# Patient Record
Sex: Female | Born: 1940 | Race: Black or African American | Hispanic: No | Marital: Married | Smoking: Never smoker
Health system: Southern US, Community
[De-identification: ages and names within clinical notes are randomized; demographics above are authoritative.]

## PROBLEM LIST (undated history)

## (undated) DIAGNOSIS — F0280 Dementia in other diseases classified elsewhere without behavioral disturbance: Secondary | ICD-10-CM

## (undated) DIAGNOSIS — Z789 Other specified health status: Secondary | ICD-10-CM

## (undated) DIAGNOSIS — R569 Unspecified convulsions: Secondary | ICD-10-CM

## (undated) DIAGNOSIS — I10 Essential (primary) hypertension: Secondary | ICD-10-CM

## (undated) DIAGNOSIS — N189 Chronic kidney disease, unspecified: Secondary | ICD-10-CM

## (undated) DIAGNOSIS — Z950 Presence of cardiac pacemaker: Secondary | ICD-10-CM

## (undated) DIAGNOSIS — D649 Anemia, unspecified: Secondary | ICD-10-CM

## (undated) DIAGNOSIS — I82409 Acute embolism and thrombosis of unspecified deep veins of unspecified lower extremity: Secondary | ICD-10-CM

## (undated) DIAGNOSIS — R269 Unspecified abnormalities of gait and mobility: Secondary | ICD-10-CM

## (undated) DIAGNOSIS — J189 Pneumonia, unspecified organism: Secondary | ICD-10-CM

## (undated) DIAGNOSIS — I2510 Atherosclerotic heart disease of native coronary artery without angina pectoris: Secondary | ICD-10-CM

## (undated) DIAGNOSIS — E876 Hypokalemia: Secondary | ICD-10-CM

## (undated) DIAGNOSIS — G309 Alzheimer's disease, unspecified: Secondary | ICD-10-CM

## (undated) DIAGNOSIS — I739 Peripheral vascular disease, unspecified: Secondary | ICD-10-CM

## (undated) HISTORY — PX: PACEMAKER PLACEMENT: SHX43

## (undated) HISTORY — PX: INSERT / REPLACE / REMOVE PACEMAKER: SUR710

## (undated) HISTORY — DX: Presence of cardiac pacemaker: Z95.0

## (undated) HISTORY — DX: Hypokalemia: E87.6

## (undated) HISTORY — DX: Essential (primary) hypertension: I10

## (undated) HISTORY — DX: Anemia, unspecified: D64.9

## (undated) HISTORY — DX: Unspecified abnormalities of gait and mobility: R26.9

## (undated) HISTORY — PX: NO PAST SURGERIES: SHX2092

## (undated) HISTORY — DX: Acute embolism and thrombosis of unspecified deep veins of unspecified lower extremity: I82.409

## (undated) HISTORY — DX: Dementia in other diseases classified elsewhere, unspecified severity, without behavioral disturbance, psychotic disturbance, mood disturbance, and anxiety: F02.80

## (undated) HISTORY — DX: Alzheimer's disease, unspecified: G30.9

---

## 1997-07-25 ENCOUNTER — Ambulatory Visit (HOSPITAL_COMMUNITY): Admission: RE | Admit: 1997-07-25 | Discharge: 1997-07-25 | Payer: Self-pay | Admitting: Psychiatry

## 1998-05-21 ENCOUNTER — Emergency Department (HOSPITAL_COMMUNITY): Admission: EM | Admit: 1998-05-21 | Discharge: 1998-05-21 | Payer: Self-pay | Admitting: Emergency Medicine

## 1998-05-21 ENCOUNTER — Encounter: Payer: Self-pay | Admitting: Emergency Medicine

## 1998-06-23 ENCOUNTER — Encounter: Payer: Self-pay | Admitting: Internal Medicine

## 1998-06-23 ENCOUNTER — Ambulatory Visit (HOSPITAL_COMMUNITY): Admission: RE | Admit: 1998-06-23 | Discharge: 1998-06-23 | Payer: Self-pay | Admitting: Internal Medicine

## 1999-04-23 ENCOUNTER — Emergency Department (HOSPITAL_COMMUNITY): Admission: EM | Admit: 1999-04-23 | Discharge: 1999-04-23 | Payer: Self-pay | Admitting: Emergency Medicine

## 1999-04-23 ENCOUNTER — Encounter: Payer: Self-pay | Admitting: Emergency Medicine

## 2000-12-09 ENCOUNTER — Encounter: Payer: Self-pay | Admitting: Emergency Medicine

## 2000-12-10 ENCOUNTER — Inpatient Hospital Stay (HOSPITAL_COMMUNITY): Admission: EM | Admit: 2000-12-10 | Discharge: 2000-12-14 | Payer: Self-pay | Admitting: Emergency Medicine

## 2000-12-16 ENCOUNTER — Encounter: Payer: Self-pay | Admitting: Emergency Medicine

## 2000-12-16 ENCOUNTER — Emergency Department (HOSPITAL_COMMUNITY): Admission: EM | Admit: 2000-12-16 | Discharge: 2000-12-16 | Payer: Self-pay | Admitting: Emergency Medicine

## 2001-05-05 HISTORY — PX: OTHER SURGICAL HISTORY: SHX169

## 2001-08-17 ENCOUNTER — Encounter: Payer: Self-pay | Admitting: Internal Medicine

## 2001-08-17 ENCOUNTER — Encounter: Admission: RE | Admit: 2001-08-17 | Discharge: 2001-08-17 | Payer: Self-pay | Admitting: Internal Medicine

## 2002-01-13 ENCOUNTER — Inpatient Hospital Stay (HOSPITAL_COMMUNITY): Admission: EM | Admit: 2002-01-13 | Discharge: 2002-01-18 | Payer: Self-pay | Admitting: *Deleted

## 2002-01-14 ENCOUNTER — Encounter: Payer: Self-pay | Admitting: Cardiology

## 2002-01-15 ENCOUNTER — Encounter: Payer: Self-pay | Admitting: Cardiology

## 2002-01-16 ENCOUNTER — Encounter: Payer: Self-pay | Admitting: Internal Medicine

## 2002-05-31 ENCOUNTER — Inpatient Hospital Stay (HOSPITAL_COMMUNITY): Admission: EM | Admit: 2002-05-31 | Discharge: 2002-06-05 | Payer: Self-pay | Admitting: Emergency Medicine

## 2002-05-31 ENCOUNTER — Encounter: Payer: Self-pay | Admitting: Internal Medicine

## 2002-06-03 ENCOUNTER — Encounter: Payer: Self-pay | Admitting: Internal Medicine

## 2002-06-04 ENCOUNTER — Encounter: Payer: Self-pay | Admitting: Internal Medicine

## 2002-06-08 ENCOUNTER — Emergency Department (HOSPITAL_COMMUNITY): Admission: EM | Admit: 2002-06-08 | Discharge: 2002-06-08 | Payer: Self-pay | Admitting: Podiatry

## 2002-06-08 ENCOUNTER — Encounter: Payer: Self-pay | Admitting: *Deleted

## 2002-06-25 ENCOUNTER — Encounter: Payer: Self-pay | Admitting: Emergency Medicine

## 2002-06-26 ENCOUNTER — Inpatient Hospital Stay (HOSPITAL_COMMUNITY): Admission: EM | Admit: 2002-06-26 | Discharge: 2002-07-03 | Payer: Self-pay | Admitting: Emergency Medicine

## 2002-06-26 ENCOUNTER — Encounter: Payer: Self-pay | Admitting: Internal Medicine

## 2002-06-28 ENCOUNTER — Encounter: Payer: Self-pay | Admitting: Internal Medicine

## 2002-06-30 ENCOUNTER — Encounter: Payer: Self-pay | Admitting: Internal Medicine

## 2004-02-18 ENCOUNTER — Ambulatory Visit: Payer: Self-pay | Admitting: *Deleted

## 2004-03-17 ENCOUNTER — Ambulatory Visit: Payer: Self-pay | Admitting: Cardiology

## 2004-04-14 ENCOUNTER — Ambulatory Visit: Payer: Self-pay | Admitting: Internal Medicine

## 2004-05-05 ENCOUNTER — Ambulatory Visit: Payer: Self-pay | Admitting: Cardiology

## 2004-05-21 ENCOUNTER — Ambulatory Visit: Payer: Self-pay | Admitting: Internal Medicine

## 2004-06-11 ENCOUNTER — Ambulatory Visit: Payer: Self-pay | Admitting: Internal Medicine

## 2004-07-09 ENCOUNTER — Ambulatory Visit: Payer: Self-pay | Admitting: Internal Medicine

## 2004-08-06 ENCOUNTER — Ambulatory Visit: Payer: Self-pay | Admitting: Cardiovascular Disease

## 2004-08-13 ENCOUNTER — Ambulatory Visit: Payer: Self-pay | Admitting: Internal Medicine

## 2004-08-27 ENCOUNTER — Ambulatory Visit: Payer: Self-pay | Admitting: Internal Medicine

## 2004-09-26 ENCOUNTER — Emergency Department (HOSPITAL_COMMUNITY): Admission: EM | Admit: 2004-09-26 | Discharge: 2004-09-26 | Payer: Self-pay | Admitting: Family Medicine

## 2004-09-29 ENCOUNTER — Emergency Department (HOSPITAL_COMMUNITY): Admission: EM | Admit: 2004-09-29 | Discharge: 2004-09-29 | Payer: Self-pay | Admitting: Family Medicine

## 2004-10-06 ENCOUNTER — Emergency Department (HOSPITAL_COMMUNITY): Admission: EM | Admit: 2004-10-06 | Discharge: 2004-10-06 | Payer: Self-pay | Admitting: Family Medicine

## 2004-10-21 ENCOUNTER — Ambulatory Visit: Payer: Self-pay | Admitting: Cardiology

## 2004-11-11 ENCOUNTER — Ambulatory Visit: Payer: Self-pay | Admitting: Internal Medicine

## 2004-12-09 ENCOUNTER — Ambulatory Visit: Payer: Self-pay | Admitting: Internal Medicine

## 2004-12-28 ENCOUNTER — Ambulatory Visit: Payer: Self-pay | Admitting: Internal Medicine

## 2005-01-06 ENCOUNTER — Ambulatory Visit: Payer: Self-pay | Admitting: Cardiology

## 2005-01-20 ENCOUNTER — Ambulatory Visit: Payer: Self-pay | Admitting: Cardiology

## 2005-02-10 ENCOUNTER — Ambulatory Visit: Payer: Self-pay | Admitting: Cardiology

## 2005-02-23 ENCOUNTER — Ambulatory Visit: Payer: Self-pay | Admitting: Cardiology

## 2005-03-09 ENCOUNTER — Ambulatory Visit: Payer: Self-pay | Admitting: *Deleted

## 2005-03-30 ENCOUNTER — Ambulatory Visit: Payer: Self-pay | Admitting: *Deleted

## 2005-04-27 ENCOUNTER — Ambulatory Visit: Payer: Self-pay | Admitting: Cardiology

## 2005-05-25 ENCOUNTER — Ambulatory Visit: Payer: Self-pay | Admitting: Cardiology

## 2005-06-15 ENCOUNTER — Ambulatory Visit: Payer: Self-pay | Admitting: Cardiology

## 2005-07-13 ENCOUNTER — Ambulatory Visit: Payer: Self-pay | Admitting: *Deleted

## 2005-08-10 ENCOUNTER — Ambulatory Visit: Payer: Self-pay | Admitting: Cardiology

## 2005-08-26 ENCOUNTER — Ambulatory Visit: Payer: Self-pay | Admitting: Internal Medicine

## 2005-09-14 ENCOUNTER — Ambulatory Visit: Payer: Self-pay | Admitting: Internal Medicine

## 2005-09-16 ENCOUNTER — Ambulatory Visit: Payer: Self-pay | Admitting: Cardiology

## 2005-10-14 ENCOUNTER — Ambulatory Visit: Payer: Self-pay | Admitting: Cardiology

## 2005-11-11 ENCOUNTER — Ambulatory Visit: Payer: Self-pay | Admitting: Cardiology

## 2005-11-24 ENCOUNTER — Ambulatory Visit: Payer: Self-pay | Admitting: Cardiology

## 2005-11-24 ENCOUNTER — Ambulatory Visit: Payer: Self-pay | Admitting: Internal Medicine

## 2005-12-15 ENCOUNTER — Ambulatory Visit: Payer: Self-pay | Admitting: Cardiology

## 2006-01-05 ENCOUNTER — Ambulatory Visit: Payer: Self-pay | Admitting: Cardiology

## 2006-02-02 ENCOUNTER — Ambulatory Visit: Payer: Self-pay | Admitting: *Deleted

## 2006-02-03 ENCOUNTER — Ambulatory Visit: Payer: Self-pay | Admitting: Internal Medicine

## 2006-02-22 ENCOUNTER — Ambulatory Visit: Payer: Self-pay | Admitting: Cardiology

## 2006-05-09 ENCOUNTER — Ambulatory Visit: Payer: Self-pay | Admitting: Cardiology

## 2006-07-11 ENCOUNTER — Ambulatory Visit: Payer: Self-pay | Admitting: Cardiology

## 2006-08-17 ENCOUNTER — Ambulatory Visit: Payer: Self-pay | Admitting: Internal Medicine

## 2006-08-29 DIAGNOSIS — G309 Alzheimer's disease, unspecified: Secondary | ICD-10-CM

## 2006-08-29 DIAGNOSIS — D649 Anemia, unspecified: Secondary | ICD-10-CM

## 2006-08-29 DIAGNOSIS — F0280 Dementia in other diseases classified elsewhere without behavioral disturbance: Secondary | ICD-10-CM

## 2006-08-29 DIAGNOSIS — E876 Hypokalemia: Secondary | ICD-10-CM

## 2006-09-22 ENCOUNTER — Ambulatory Visit: Payer: Self-pay | Admitting: Cardiovascular Disease

## 2006-10-20 ENCOUNTER — Ambulatory Visit: Payer: Self-pay | Admitting: Internal Medicine

## 2006-11-17 ENCOUNTER — Ambulatory Visit: Payer: Self-pay | Admitting: Cardiology

## 2006-12-08 ENCOUNTER — Ambulatory Visit: Payer: Self-pay | Admitting: Internal Medicine

## 2006-12-08 LAB — CONVERTED CEMR LAB: Hgb A1c MFr Bld: 6.3 % — ABNORMAL HIGH (ref 4.6–6.0)

## 2006-12-15 ENCOUNTER — Ambulatory Visit: Payer: Self-pay | Admitting: Internal Medicine

## 2006-12-15 DIAGNOSIS — R1310 Dysphagia, unspecified: Secondary | ICD-10-CM | POA: Insufficient documentation

## 2006-12-22 ENCOUNTER — Ambulatory Visit: Payer: Self-pay | Admitting: Cardiology

## 2007-04-30 ENCOUNTER — Ambulatory Visit: Payer: Self-pay | Admitting: Cardiology

## 2007-05-01 ENCOUNTER — Ambulatory Visit: Payer: Self-pay | Admitting: Internal Medicine

## 2007-05-01 LAB — CONVERTED CEMR LAB
Chloride: 101 meq/L (ref 96–112)
Creatinine, Ser: 1.1 mg/dL (ref 0.4–1.2)
Eosinophils Absolute: 0.1 10*3/uL (ref 0.0–0.6)
Eosinophils Relative: 2.7 % (ref 0.0–5.0)
GFR calc non Af Amer: 53 mL/min
Glucose, Bld: 129 mg/dL — ABNORMAL HIGH (ref 70–99)
HCT: 37 % (ref 36.0–46.0)
Hemoglobin: 11.9 g/dL — ABNORMAL LOW (ref 12.0–15.0)
MCV: 86.3 fL (ref 78.0–100.0)
Monocytes Absolute: 0.6 10*3/uL (ref 0.2–0.7)
Neutrophils Relative %: 56 % (ref 43.0–77.0)
RBC: 4.28 M/uL (ref 3.87–5.11)
Sodium: 138 meq/L (ref 135–145)
WBC: 5.5 10*3/uL (ref 4.5–10.5)

## 2007-05-02 ENCOUNTER — Ambulatory Visit: Payer: Self-pay | Admitting: Internal Medicine

## 2007-05-02 ENCOUNTER — Ambulatory Visit (HOSPITAL_COMMUNITY): Admission: RE | Admit: 2007-05-02 | Discharge: 2007-05-02 | Payer: Self-pay | Admitting: Internal Medicine

## 2007-05-16 ENCOUNTER — Ambulatory Visit: Payer: Self-pay

## 2007-05-28 ENCOUNTER — Ambulatory Visit: Payer: Self-pay | Admitting: Internal Medicine

## 2007-05-28 ENCOUNTER — Ambulatory Visit (HOSPITAL_COMMUNITY): Admission: RE | Admit: 2007-05-28 | Discharge: 2007-05-28 | Payer: Self-pay | Admitting: Internal Medicine

## 2007-05-28 ENCOUNTER — Ambulatory Visit: Payer: Self-pay | Admitting: Cardiology

## 2007-06-18 ENCOUNTER — Ambulatory Visit: Payer: Self-pay | Admitting: Internal Medicine

## 2007-06-21 ENCOUNTER — Ambulatory Visit: Payer: Self-pay | Admitting: Internal Medicine

## 2007-06-21 DIAGNOSIS — Z8719 Personal history of other diseases of the digestive system: Secondary | ICD-10-CM

## 2007-06-24 LAB — CONVERTED CEMR LAB
AST: 15 units/L (ref 0–37)
BUN: 12 mg/dL (ref 6–23)
Basophils Relative: 1.3 % — ABNORMAL HIGH (ref 0.0–1.0)
Direct LDL: 95.9 mg/dL
Eosinophils Relative: 2.4 % (ref 0.0–5.0)
HCT: 34.5 % — ABNORMAL LOW (ref 36.0–46.0)
Neutrophils Relative %: 54.3 % (ref 43.0–77.0)
Potassium: 4 meq/L (ref 3.5–5.1)
RBC: 3.94 M/uL (ref 3.87–5.11)
RDW: 12.9 % (ref 11.5–14.6)
Total CHOL/HDL Ratio: 4.2
Triglycerides: 217 mg/dL (ref 0–149)
WBC: 6 10*3/uL (ref 4.5–10.5)

## 2007-06-25 ENCOUNTER — Encounter (INDEPENDENT_AMBULATORY_CARE_PROVIDER_SITE_OTHER): Payer: Self-pay | Admitting: *Deleted

## 2007-06-29 ENCOUNTER — Encounter (INDEPENDENT_AMBULATORY_CARE_PROVIDER_SITE_OTHER): Payer: Self-pay | Admitting: *Deleted

## 2007-06-29 ENCOUNTER — Ambulatory Visit: Payer: Self-pay | Admitting: Internal Medicine

## 2007-06-29 LAB — CONVERTED CEMR LAB: OCCULT 3: NEGATIVE

## 2007-06-30 ENCOUNTER — Telehealth: Payer: Self-pay | Admitting: Family Medicine

## 2007-07-02 ENCOUNTER — Telehealth (INDEPENDENT_AMBULATORY_CARE_PROVIDER_SITE_OTHER): Payer: Self-pay | Admitting: *Deleted

## 2007-07-03 ENCOUNTER — Ambulatory Visit: Payer: Self-pay | Admitting: Internal Medicine

## 2007-07-12 ENCOUNTER — Ambulatory Visit: Payer: Self-pay | Admitting: Internal Medicine

## 2007-07-26 ENCOUNTER — Ambulatory Visit: Payer: Self-pay | Admitting: Internal Medicine

## 2007-07-26 ENCOUNTER — Telehealth (INDEPENDENT_AMBULATORY_CARE_PROVIDER_SITE_OTHER): Payer: Self-pay | Admitting: *Deleted

## 2007-08-02 ENCOUNTER — Telehealth (INDEPENDENT_AMBULATORY_CARE_PROVIDER_SITE_OTHER): Payer: Self-pay | Admitting: *Deleted

## 2007-08-09 ENCOUNTER — Ambulatory Visit: Payer: Self-pay | Admitting: Internal Medicine

## 2007-08-14 ENCOUNTER — Ambulatory Visit: Payer: Self-pay | Admitting: Internal Medicine

## 2007-08-16 ENCOUNTER — Ambulatory Visit: Payer: Self-pay | Admitting: Internal Medicine

## 2007-08-23 ENCOUNTER — Ambulatory Visit: Payer: Self-pay | Admitting: Internal Medicine

## 2007-09-13 ENCOUNTER — Ambulatory Visit: Payer: Self-pay | Admitting: Cardiology

## 2007-10-11 ENCOUNTER — Ambulatory Visit: Payer: Self-pay | Admitting: Cardiology

## 2007-11-08 ENCOUNTER — Ambulatory Visit: Payer: Self-pay | Admitting: Internal Medicine

## 2007-11-29 ENCOUNTER — Ambulatory Visit: Payer: Self-pay | Admitting: Cardiology

## 2007-12-27 ENCOUNTER — Ambulatory Visit: Payer: Self-pay | Admitting: Cardiology

## 2007-12-27 ENCOUNTER — Ambulatory Visit: Payer: Self-pay | Admitting: Internal Medicine

## 2008-01-11 ENCOUNTER — Telehealth (INDEPENDENT_AMBULATORY_CARE_PROVIDER_SITE_OTHER): Payer: Self-pay | Admitting: *Deleted

## 2008-01-22 ENCOUNTER — Telehealth (INDEPENDENT_AMBULATORY_CARE_PROVIDER_SITE_OTHER): Payer: Self-pay | Admitting: *Deleted

## 2008-01-24 ENCOUNTER — Ambulatory Visit: Payer: Self-pay | Admitting: Cardiology

## 2008-02-08 ENCOUNTER — Ambulatory Visit: Payer: Self-pay | Admitting: Internal Medicine

## 2008-02-11 ENCOUNTER — Telehealth (INDEPENDENT_AMBULATORY_CARE_PROVIDER_SITE_OTHER): Payer: Self-pay | Admitting: *Deleted

## 2008-02-14 ENCOUNTER — Ambulatory Visit: Payer: Self-pay | Admitting: Cardiology

## 2008-02-19 ENCOUNTER — Encounter (INDEPENDENT_AMBULATORY_CARE_PROVIDER_SITE_OTHER): Payer: Self-pay | Admitting: *Deleted

## 2008-02-19 LAB — CONVERTED CEMR LAB
BUN: 11 mg/dL (ref 6–23)
Basophils Relative: 0.3 % (ref 0.0–3.0)
Eosinophils Absolute: 0.1 10*3/uL (ref 0.0–0.7)
Eosinophils Relative: 2.2 % (ref 0.0–5.0)
HCT: 37.1 % (ref 36.0–46.0)
MCV: 86.6 fL (ref 78.0–100.0)
Neutrophils Relative %: 61.5 % (ref 43.0–77.0)
RBC: 4.29 M/uL (ref 3.87–5.11)
WBC: 6.3 10*3/uL (ref 4.5–10.5)

## 2008-03-13 ENCOUNTER — Ambulatory Visit: Payer: Self-pay | Admitting: Cardiology

## 2008-03-18 ENCOUNTER — Ambulatory Visit: Payer: Self-pay | Admitting: Internal Medicine

## 2008-04-10 ENCOUNTER — Ambulatory Visit: Payer: Self-pay | Admitting: Cardiovascular Disease

## 2008-05-05 ENCOUNTER — Telehealth (INDEPENDENT_AMBULATORY_CARE_PROVIDER_SITE_OTHER): Payer: Self-pay | Admitting: *Deleted

## 2008-05-08 ENCOUNTER — Ambulatory Visit: Payer: Self-pay | Admitting: Internal Medicine

## 2008-05-14 ENCOUNTER — Ambulatory Visit: Payer: Self-pay | Admitting: Family Medicine

## 2008-05-14 LAB — CONVERTED CEMR LAB
BUN: 8 mg/dL (ref 6–23)
Basophils Absolute: 0 10*3/uL (ref 0.0–0.1)
Bilirubin, Direct: 0.1 mg/dL (ref 0.0–0.3)
Cholesterol: 170 mg/dL (ref 0–200)
Creatinine, Ser: 1 mg/dL (ref 0.4–1.2)
Eosinophils Absolute: 0.2 10*3/uL (ref 0.0–0.7)
HCT: 36.7 % (ref 36.0–46.0)
HDL: 47.3 mg/dL (ref >39.0)
Hgb A1c MFr Bld: 7.1 % — ABNORMAL HIGH (ref 4.6–6.0)
MCHC: 32.9 g/dL (ref 30.0–36.0)
MCV: 86.5 fL (ref 78.0–100.0)
Monocytes Absolute: 0.6 10*3/uL (ref 0.1–1.0)
Monocytes Relative: 10.5 % (ref 3.0–12.0)
Platelets: 185 10*3/uL (ref 150–400)
Potassium: 4.4 meq/L (ref 3.5–5.1)
RDW: 12.7 % (ref 11.5–14.6)
TSH: 2.31 microintl units/mL (ref 0.35–5.50)
Total Bilirubin: 1.1 mg/dL (ref 0.3–1.2)
Total CHOL/HDL Ratio: 3.6
Triglycerides: 193 mg/dL — ABNORMAL HIGH (ref 0–149)

## 2008-05-16 ENCOUNTER — Ambulatory Visit: Payer: Self-pay | Admitting: Internal Medicine

## 2008-05-16 DIAGNOSIS — F911 Conduct disorder, childhood-onset type: Secondary | ICD-10-CM | POA: Insufficient documentation

## 2008-05-16 DIAGNOSIS — I10 Essential (primary) hypertension: Secondary | ICD-10-CM | POA: Insufficient documentation

## 2008-05-20 ENCOUNTER — Encounter (INDEPENDENT_AMBULATORY_CARE_PROVIDER_SITE_OTHER): Payer: Self-pay | Admitting: *Deleted

## 2008-05-28 ENCOUNTER — Encounter: Payer: Self-pay | Admitting: Internal Medicine

## 2008-06-05 ENCOUNTER — Ambulatory Visit: Payer: Self-pay | Admitting: Cardiovascular Disease

## 2008-06-19 ENCOUNTER — Encounter: Admission: RE | Admit: 2008-06-19 | Discharge: 2008-06-19 | Payer: Self-pay | Admitting: Internal Medicine

## 2008-07-03 ENCOUNTER — Ambulatory Visit: Payer: Self-pay | Admitting: Cardiovascular Disease

## 2008-07-14 ENCOUNTER — Ambulatory Visit: Payer: Self-pay | Admitting: Internal Medicine

## 2008-07-17 ENCOUNTER — Ambulatory Visit: Payer: Self-pay | Admitting: Internal Medicine

## 2008-07-18 ENCOUNTER — Encounter (INDEPENDENT_AMBULATORY_CARE_PROVIDER_SITE_OTHER): Payer: Self-pay | Admitting: *Deleted

## 2008-07-20 LAB — CONVERTED CEMR LAB: Hgb A1c MFr Bld: 6.8 % — ABNORMAL HIGH (ref 4.6–6.5)

## 2008-07-21 ENCOUNTER — Encounter (INDEPENDENT_AMBULATORY_CARE_PROVIDER_SITE_OTHER): Payer: Self-pay | Admitting: *Deleted

## 2008-07-21 ENCOUNTER — Ambulatory Visit: Payer: Self-pay | Admitting: Internal Medicine

## 2008-07-21 DIAGNOSIS — E1149 Type 2 diabetes mellitus with other diabetic neurological complication: Secondary | ICD-10-CM

## 2008-08-07 ENCOUNTER — Ambulatory Visit: Payer: Self-pay | Admitting: Internal Medicine

## 2008-09-02 ENCOUNTER — Encounter: Payer: Self-pay | Admitting: *Deleted

## 2008-09-04 ENCOUNTER — Ambulatory Visit: Payer: Self-pay

## 2008-09-04 ENCOUNTER — Ambulatory Visit: Payer: Self-pay | Admitting: Internal Medicine

## 2008-09-04 ENCOUNTER — Encounter: Payer: Self-pay | Admitting: Internal Medicine

## 2008-10-02 ENCOUNTER — Ambulatory Visit: Payer: Self-pay | Admitting: Cardiology

## 2008-10-08 ENCOUNTER — Encounter: Payer: Self-pay | Admitting: *Deleted

## 2008-11-03 ENCOUNTER — Ambulatory Visit: Payer: Self-pay | Admitting: Internal Medicine

## 2008-11-03 LAB — CONVERTED CEMR LAB: POC INR: 2.2

## 2008-11-12 ENCOUNTER — Encounter: Payer: Self-pay | Admitting: Cardiology

## 2008-12-05 ENCOUNTER — Ambulatory Visit: Payer: Self-pay | Admitting: Internal Medicine

## 2009-01-02 ENCOUNTER — Ambulatory Visit: Payer: Self-pay | Admitting: Internal Medicine

## 2009-01-27 ENCOUNTER — Ambulatory Visit: Payer: Self-pay | Admitting: Internal Medicine

## 2009-01-29 ENCOUNTER — Encounter (INDEPENDENT_AMBULATORY_CARE_PROVIDER_SITE_OTHER): Payer: Self-pay | Admitting: *Deleted

## 2009-01-29 LAB — CONVERTED CEMR LAB
BUN: 10 mg/dL (ref 6–23)
Creatinine, Ser: 1.1 mg/dL (ref 0.4–1.2)
Hgb A1c MFr Bld: 7.4 % — ABNORMAL HIGH (ref 4.6–6.5)
Microalb, Ur: 25.8 mg/dL — ABNORMAL HIGH (ref 0.0–1.9)
Potassium: 4.2 meq/L (ref 3.5–5.1)
TSH: 1.75 microintl units/mL (ref 0.35–5.50)

## 2009-02-02 ENCOUNTER — Ambulatory Visit: Payer: Self-pay | Admitting: Cardiology

## 2009-02-02 LAB — CONVERTED CEMR LAB: POC INR: 2.7

## 2009-02-11 ENCOUNTER — Ambulatory Visit: Payer: Self-pay | Admitting: Internal Medicine

## 2009-02-11 LAB — CONVERTED CEMR LAB: OCCULT 3: NEGATIVE

## 2009-02-12 ENCOUNTER — Encounter (INDEPENDENT_AMBULATORY_CARE_PROVIDER_SITE_OTHER): Payer: Self-pay | Admitting: *Deleted

## 2009-03-04 ENCOUNTER — Ambulatory Visit: Payer: Self-pay | Admitting: Cardiology

## 2009-03-04 LAB — CONVERTED CEMR LAB: POC INR: 2.8

## 2009-03-09 ENCOUNTER — Ambulatory Visit: Payer: Self-pay

## 2009-03-09 ENCOUNTER — Encounter: Payer: Self-pay | Admitting: Internal Medicine

## 2009-04-10 ENCOUNTER — Ambulatory Visit: Payer: Self-pay | Admitting: Cardiology

## 2009-04-21 ENCOUNTER — Telehealth: Payer: Self-pay | Admitting: Internal Medicine

## 2009-05-07 ENCOUNTER — Ambulatory Visit: Payer: Self-pay | Admitting: Cardiology

## 2009-05-11 ENCOUNTER — Telehealth (INDEPENDENT_AMBULATORY_CARE_PROVIDER_SITE_OTHER): Payer: Self-pay | Admitting: *Deleted

## 2009-06-04 ENCOUNTER — Ambulatory Visit: Payer: Self-pay | Admitting: Cardiology

## 2009-06-04 LAB — CONVERTED CEMR LAB: POC INR: 2.6

## 2009-06-24 ENCOUNTER — Encounter: Admission: RE | Admit: 2009-06-24 | Discharge: 2009-06-24 | Payer: Self-pay | Admitting: Internal Medicine

## 2009-06-24 LAB — HM MAMMOGRAPHY: HM Mammogram: NEGATIVE

## 2009-07-03 ENCOUNTER — Ambulatory Visit: Payer: Self-pay | Admitting: Internal Medicine

## 2009-08-04 ENCOUNTER — Ambulatory Visit: Payer: Self-pay | Admitting: Cardiology

## 2009-08-17 ENCOUNTER — Telehealth (INDEPENDENT_AMBULATORY_CARE_PROVIDER_SITE_OTHER): Payer: Self-pay | Admitting: *Deleted

## 2009-08-24 ENCOUNTER — Encounter (INDEPENDENT_AMBULATORY_CARE_PROVIDER_SITE_OTHER): Payer: Self-pay | Admitting: *Deleted

## 2009-09-04 ENCOUNTER — Ambulatory Visit: Payer: Self-pay | Admitting: Cardiovascular Disease

## 2009-09-10 ENCOUNTER — Ambulatory Visit: Payer: Self-pay | Admitting: Internal Medicine

## 2009-09-11 ENCOUNTER — Telehealth (INDEPENDENT_AMBULATORY_CARE_PROVIDER_SITE_OTHER): Payer: Self-pay | Admitting: *Deleted

## 2009-09-18 ENCOUNTER — Telehealth (INDEPENDENT_AMBULATORY_CARE_PROVIDER_SITE_OTHER): Payer: Self-pay | Admitting: *Deleted

## 2009-09-18 LAB — CONVERTED CEMR LAB
AST: 16 units/L (ref 0–37)
Alkaline Phosphatase: 73 units/L (ref 39–117)
BUN: 17 mg/dL (ref 6–23)
Basophils Absolute: 0.1 10*3/uL (ref 0.0–0.1)
Bilirubin, Direct: 0.1 mg/dL (ref 0.0–0.3)
Cholesterol: 206 mg/dL — ABNORMAL HIGH (ref 0–200)
Hemoglobin: 12.5 g/dL (ref 12.0–15.0)
Hgb A1c MFr Bld: 8.9 % — ABNORMAL HIGH (ref 4.6–6.5)
Lymphocytes Relative: 28.2 % (ref 12.0–46.0)
Monocytes Relative: 8.8 % (ref 3.0–12.0)
Platelets: 195 10*3/uL (ref 150.0–400.0)
RDW: 13.9 % (ref 11.5–14.6)
Total Bilirubin: 1.3 mg/dL — ABNORMAL HIGH (ref 0.3–1.2)
Total CHOL/HDL Ratio: 5
VLDL: 53.8 mg/dL — ABNORMAL HIGH (ref 0.0–40.0)

## 2009-10-02 ENCOUNTER — Ambulatory Visit: Payer: Self-pay | Admitting: Cardiology

## 2009-10-02 LAB — CONVERTED CEMR LAB: POC INR: 2.9

## 2009-10-06 ENCOUNTER — Ambulatory Visit: Payer: Self-pay | Admitting: Internal Medicine

## 2009-10-06 DIAGNOSIS — E785 Hyperlipidemia, unspecified: Secondary | ICD-10-CM

## 2009-10-06 DIAGNOSIS — Z950 Presence of cardiac pacemaker: Secondary | ICD-10-CM

## 2009-10-06 DIAGNOSIS — I10 Essential (primary) hypertension: Secondary | ICD-10-CM | POA: Insufficient documentation

## 2009-10-06 HISTORY — DX: Presence of cardiac pacemaker: Z95.0

## 2009-11-04 ENCOUNTER — Ambulatory Visit: Payer: Self-pay | Admitting: Cardiology

## 2009-11-04 LAB — CONVERTED CEMR LAB: POC INR: 2.6

## 2009-11-27 ENCOUNTER — Ambulatory Visit: Payer: Self-pay | Admitting: Internal Medicine

## 2009-12-04 ENCOUNTER — Ambulatory Visit: Payer: Self-pay | Admitting: Cardiology

## 2010-01-04 ENCOUNTER — Ambulatory Visit: Payer: Self-pay | Admitting: Internal Medicine

## 2010-01-04 LAB — CONVERTED CEMR LAB: POC INR: 2.3

## 2010-02-04 ENCOUNTER — Ambulatory Visit: Payer: Self-pay | Admitting: Cardiology

## 2010-02-04 LAB — CONVERTED CEMR LAB: POC INR: 2.1

## 2010-02-16 ENCOUNTER — Telehealth (INDEPENDENT_AMBULATORY_CARE_PROVIDER_SITE_OTHER): Payer: Self-pay | Admitting: *Deleted

## 2010-03-05 ENCOUNTER — Ambulatory Visit: Payer: Self-pay | Admitting: Cardiovascular Disease

## 2010-03-05 LAB — CONVERTED CEMR LAB: POC INR: 2.6

## 2010-04-06 ENCOUNTER — Ambulatory Visit: Admission: RE | Admit: 2010-04-06 | Discharge: 2010-04-06 | Payer: Self-pay | Source: Home / Self Care

## 2010-04-25 ENCOUNTER — Encounter: Payer: Self-pay | Admitting: Internal Medicine

## 2010-05-04 NOTE — Progress Notes (Signed)
Summary: Labs Ov (lmom 5/17, 5/19)  Phone Note Other Incoming   Summary of Call: Due for Labs and appt, per Labs in 01/29/09. Dr. Alwyn Ren wanted A1C repeated in 4 months. Since it has been over 4 months please have pt schedule a Fasting OV with [facility].  Initial call taken by: Army Fossa CMA,  Aug 17, 2009 4:15 PM  Follow-up for Phone Call        [email or web address] Barban  Aug 18, 2009 9:58 AM  [email or web address] Barban  Aug 20, 2009 11:19 AM  Additional Follow-up for Phone Call Additional follow up Details #1::        Mailed letter to patient. Additional Follow-up by: Harold Barban,  Aug 24, 2009 7:53 AM

## 2010-05-04 NOTE — Medication Information (Signed)
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 yes       Details: Cough medication called in  Recent/future dental: no  Any missed doses?: no       Is patient compliant with meds? yes       Allergies (verified): 1)  ! Ace Inhibitors 2)  ! * Flu Vaccination  Anticoagulation Management History:      The patient is taking warfarin and comes in today for a routine follow up visit.  Positive risk factors for bleeding include an age of 70 years or older and presence of serious comorbidities.  The bleeding index is 'intermediate risk'.  Positive CHADS2 values include History of HTN and History of Diabetes.  Negative CHADS2 values include Age > 33 years old.  The start date was 07/10/2002.  Her last INR was 1.3 RATIO.  Anticoagulation responsible provider: Daleen Squibb MD, Maisie Fus.  INR POC: 2.1.  Cuvette Lot#: [id number].  Exp: 03/2011.    Anticoagulation Management Assessment/Plan:      The patient's current anticoagulation dose is Warfarin sodium 5 mg tabs: Use as directed by Anticoagulation Clinic.  The target INR is 2 - 3.  The next INR is due 03/05/2010.  Anticoagulation instructions were given to patient.  Results were reviewed/authorized by Weston Brass, PharmD.  She was notified by Hoy Register, PharmD Candidate.         Prior Anticoagulation Instructions: INR 2.3 Continue 5mg s daily except 7.5mg s on Wednesdays. Recheck in 4 weeks.   Current Anticoagulation Instructions: INR 2.1  Continue previous dose of 1 tablet everyday  except 1.5 tablets on Wenesday  Recheck INR in 4 weeks.

## 2010-05-04 NOTE — Progress Notes (Signed)
Summary: Refill Request  Phone Note Refill Request Call back at [phone] Message from:  Pharmacy on September 11, 2009 9:06 AM  Refills Requested: Medication #1:  LORAZEPAM 1 MG TABS 1/2 -1 q 8 hrs as needed agitation.   Dosage confirmed as above?Dosage Confirmed   Supply Requested: 1 month   Last Ref[facility]: [facility] [address]  Next Appointment Scheduled: October 02 2009 Initial call taken by: Harold Barban,  September 11, 2009 9:07 AM    Prescriptions: LORAZEPAM 1 MG TABS (LORAZEPAM) 1/2 -1 q 8 hrs as needed agitation  #30 x 2   Entered by:   Shonna Chock   Authorized by:   Marga Melnick MD   Signed by:   Shonna Chock on 09/11/2009   Method used:   Printed then faxed to ...       [facility] [address] [id number]       [address]       [place], [place]  [address]       Ph: [phone]       Fax: [phone]   RxID:   [id number]

## 2010-05-04 NOTE — Medication Information (Signed)
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 yes       Details: started metformin  Recent/future dental: no  Any missed doses?: no       Is patient compliant with meds? yes       Allergies: 1)  ! Ace Inhibitors 2)  ! * Flu Vaccination  Anticoagulation Management History:      The patient is taking warfarin and comes in today for a routine follow up visit.  Positive risk factors for bleeding include an age of 6 years or older and presence of serious comorbidities.  The bleeding index is 'intermediate risk'.  Positive CHADS2 values include History of HTN and History of Diabetes.  Negative CHADS2 values include Age > 68 years old.  The start date was 07/10/2002.  Her last INR was 1.3 RATIO.  Anticoagulation responsible provider: Juanda Chance MD, Smitty Cords.  INR POC: 2.9.  Cuvette Lot#: [id number].  Exp: 12/2010.    Anticoagulation Management Assessment/Plan:      The patient's current anticoagulation dose is Warfarin sodium 5 mg tabs: Use as directed by Anticoagulation Clinic.  The target INR is 2 - 3.  The next INR is due 11/04/2009.  Anticoagulation instructions were given to patient.  Results were reviewed/authorized by Weston Brass, PharmD.  She was notified by Weston Brass PharmD.         Prior Anticoagulation Instructions: INR 2.4  Continue same dose of 1 tablet every day except 1 1/2 tablets on Wednesday.   Current Anticoagulation Instructions: INR 2.9  Continue same dose of 1 tablet every day except 1 1/2 tablets on Wednesday.

## 2010-05-04 NOTE — Progress Notes (Signed)
Summary: Refill Request  Phone Note Refill Request   Refills Requested: Medication #1:  DIOVAN 320  MG  TABS (VALSARTAN) Take one tablet by mouth every day [facility]   Method Requested: Electronic Initial call taken by: Shonna Chock,  May 11, 2009 10:08 AM    Prescriptions: DIOVAN 320  MG  TABS (VALSARTAN) Take one tablet by mouth every day  #30 x 5   Entered by:   Shonna Chock   Authorized by:   Marga Melnick MD   Signed by:   Shonna Chock on 05/11/2009   Method used:   Faxed to ...       [facility] [address] [id number]       [address]       [place], [place]  [address]       Ph: [phone]       Fax: [phone]   RxID:   [id number]

## 2010-05-04 NOTE — Medication Information (Signed)
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 no    Recent/future dental: no  Any missed doses?: no       Is patient compliant with meds? yes       Allergies: 1)  ! Ace Inhibitors 2)  ! * Flu Vaccination  Anticoagulation Management History:      The patient is taking warfarin and comes in today for a routine follow up visit.  Positive risk factors for bleeding include an age of 70 years or older and presence of serious comorbidities.  The bleeding index is 'intermediate risk'.  Positive CHADS2 values include History of HTN and History of Diabetes.  Negative CHADS2 values include Age > 48 years old.  The start date was 07/10/2002.  Her last INR was 1.3 RATIO.  Anticoagulation responsible provider: Riley Kill MD, Maisie Fus.  INR POC: 2.6.  Cuvette Lot#: [id number].  Exp: 01/2011.    Anticoagulation Management Assessment/Plan:      The patient's current anticoagulation dose is Warfarin sodium 5 mg tabs: Use as directed by Anticoagulation Clinic.  The target INR is 2 - 3.  The next INR is due 12/04/2009.  Anticoagulation instructions were given to patient.  Results were reviewed/authorized by Weston Brass, PharmD.  She was notified by Liana Gerold, PharmD candidate.         Prior Anticoagulation Instructions: INR 2.9  Continue same dose of 1 tablet every day except 1 1/2 tablets on Wednesday.   Current Anticoagulation Instructions: INR 2.6  Continue with Coumadin 1 tablet (5 mg) daily except for 1.5 tablets (7.5 mg) on Wednesdays.  Return to clinic on  Friday Sep. 2

## 2010-05-04 NOTE — Medication Information (Signed)
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 no    Recent/future dental: no  Any missed doses?: no       Is patient compliant with meds? yes       Allergies: 1)  ! Ace Inhibitors 2)  ! * Flu Vaccination  Anticoagulation Management History:      The patient is taking warfarin and comes in today for a routine follow up visit.  Positive risk factors for bleeding include an age of 70 years or older and presence of serious comorbidities.  The bleeding index is 'intermediate risk'.  Positive CHADS2 values include History of HTN and History of Diabetes.  Negative CHADS2 values include Age > 37 years old.  The start date was 07/10/2002.  Her last INR was 1.3 RATIO.  Anticoagulation responsible provider: Antoine Poche MD, Fayrene Fearing.  INR POC: 2.4.  Cuvette Lot#: [id number].  Exp: 05/2010.    Anticoagulation Management Assessment/Plan:      The patient's current anticoagulation dose is Warfarin sodium 5 mg tabs: Use as directed by Anticoagulation Clinic.  The target INR is 2 - 3.  The next INR is due 05/07/2009.  Anticoagulation instructions were given to patient.  Results were reviewed/authorized by Shelby Dubin, PharmD, BCPS, CPP.  She was notified by Lew Dawes, PharmD Candidate.         Prior Anticoagulation Instructions: INR 2.8  Take 1 tab daily except 1.5 tabs on Wednesdays.    Current Anticoagulation Instructions: INR 2.4  Continue on same dose of 1 tablet daily except 1.5mg  on Wednesdays. Recheck in 4 weeks.

## 2010-05-04 NOTE — Medication Information (Signed)
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    Bleeding/hemorrhagic complications: no    Recent/future hospitalizations: no    Any changes in medication regimen? no    Recent/future dental: no  Any missed doses?: no       Is patient compliant with meds? yes       Allergies: 1)  ! Ace Inhibitors 2)  ! * Flu Vaccination  Anticoagulation Management History:      The patient is taking warfarin and comes in today for a routine follow up visit.  Positive risk factors for bleeding include an age of 10 years or older and presence of serious comorbidities.  The bleeding index is 'intermediate risk'.  Positive CHADS2 values include History of HTN and History of Diabetes.  Negative CHADS2 values include Age > 28 years old.  The start date was 07/10/2002.  Her last INR was 1.3 RATIO.  Anticoagulation responsible provider: Daleen Squibb MD, Maisie Fus.  INR POC: 2.3.  Cuvette Lot#: [id number].  Exp: 01/2011.    Anticoagulation Management Assessment/Plan:      The patient's current anticoagulation dose is Warfarin sodium 5 mg tabs: Use as directed by Anticoagulation Clinic.  The target INR is 2 - 3.  The next INR is due 01/04/2010.  Anticoagulation instructions were given to patient.  Results were reviewed/authorized by Cloyde Reams, RN, BSN.  She was notified by Cloyde Reams RN.         Prior Anticoagulation Instructions: INR 2.6  Continue with Coumadin 1 tablet (5 mg) daily except for 1.5 tablets (7.5 mg) on Wednesdays.  Return to clinic on  Friday Sep. 2  Current Anticoagulation Instructions: INR 2.3  Continue on same dosage 1 tablet daily  except 1.5 tablet on Wednesdays.  Recheck in 4 weeks.

## 2010-05-04 NOTE — Medication Information (Signed)
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 no    Recent/future dental: no  Any missed doses?: no       Is patient compliant with meds? yes       Allergies: 1)  ! Ace Inhibitors 2)  ! * Flu Vaccination  Anticoagulation Management History:      The patient is taking warfarin and comes in today for a routine follow up visit.  Positive risk factors for bleeding include an age of 70 years or older and presence of serious comorbidities.  The bleeding index is 'intermediate risk'.  Positive CHADS2 values include History of HTN and History of Diabetes.  Negative CHADS2 values include Age > 1 years old.  The start date was 07/10/2002.  Her last INR was 1.3 RATIO.  Anticoagulation responsible provider: Juanda Chance MD, Smitty Cords.  INR POC: 2.7.  Cuvette Lot#: [id number].  Exp: 07/2010.    Anticoagulation Management Assessment/Plan:      The patient's current anticoagulation dose is Warfarin sodium 5 mg tabs: Use as directed by Anticoagulation Clinic.  The target INR is 2 - 3.  The next INR is due 06/04/2009.  Anticoagulation instructions were given to patient.  Results were reviewed/authorized by Bethena Midget, RN, BSN.  She was notified by Bethena Midget, RN, BSN.         Prior Anticoagulation Instructions: INR 2.4  Continue on same dose of 1 tablet daily except 1.5mg  on Wednesdays. Recheck in 4 weeks.  Current Anticoagulation Instructions: INR 2.7 Continue 5mg s everyday except 7.5mg s on Wednesdays. Recheck in 4 weeks.

## 2010-05-04 NOTE — Letter (Signed)
Summary: Primary Care Appointment Letter  [facility]  [address] [place], [place] [address]   Phone: [phone]  Fax: [phone]    08/24/2009 MRN: [id number]  [facility] [address] [place], [place]  [address]  Dear [name],   Your Primary Care Physician Marga Melnick MD has indicated that:    _______it is time to schedule an appointment.    _______you missed your appointment on______ and need to call and          reschedule.    _______you need to have lab work done.    _______you need to schedule an appointment discuss lab or test results.    _______you need to call to reschedule your appointment that is                       scheduled on _________.     Please call our office as soon as possible. Our phone number is [phone]. Please press option 1. Our office is open 8a-12noon and 1p-5p, Monday through Friday.     Thank you,    [facility]

## 2010-05-04 NOTE — Medication Information (Signed)
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 no    Recent/future dental: no  Any missed doses?: no       Is patient compliant with meds? yes       Allergies: 1)  ! Ace Inhibitors 2)  ! * Flu Vaccination  Anticoagulation Management History:      The patient is taking warfarin and comes in today for a routine follow up visit.  Positive risk factors for bleeding include an age of 24 years or older and presence of serious comorbidities.  The bleeding index is 'intermediate risk'.  Positive CHADS2 values include History of HTN and History of Diabetes.  Negative CHADS2 values include Age > 72 years old.  The start date was 07/10/2002.  Her last INR was 1.3 RATIO.  Anticoagulation responsible provider: Excell Seltzer MD, Casimiro Needle.  INR POC: 2.6.  Cuvette Lot#: [id number].  Exp: 03/2011.    Anticoagulation Management Assessment/Plan:      The patient's current anticoagulation dose is Warfarin sodium 5 mg tabs: Use as directed by Anticoagulation Clinic.  The target INR is 2 - 3.  The next INR is due 04/06/2010.  Anticoagulation instructions were given to patient.  Results were reviewed/authorized by Bethena Midget, RN, BSN.  She was notified by Bethena Midget, RN, BSN.         Prior Anticoagulation Instructions: INR 2.1  Continue previous dose of 1 tablet everyday except 1.5 tablets on Wenesday  Recheck INR in 4 weeks.   Current Anticoagulation Instructions: INR 2.6 Continue 1 pill everyday except 1.5 pills on Wednesdays. Recheck in 4 weeks.

## 2010-05-04 NOTE — Medication Information (Signed)
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 no    Recent/future dental: no  Any missed doses?: no       Is patient compliant with meds? yes       Allergies: 1)  ! Ace Inhibitors 2)  ! * Flu Vaccination  Anticoagulation Management History:      The patient is taking warfarin and comes in today for a routine follow up visit.  Positive risk factors for bleeding include an age of 70 years or older and presence of serious comorbidities.  The bleeding index is 'intermediate risk'.  Positive CHADS2 values include History of HTN and History of Diabetes.  Negative CHADS2 values include Age > 86 years old.  The start date was 07/10/2002.  Her last INR was 1.3 RATIO.  Anticoagulation responsible provider: Tenny Craw MD, Gunnar Fusi.  INR POC: 2.2.  Cuvette Lot#: [id number].  Exp: 08/2010.    Anticoagulation Management Assessment/Plan:      The patient's current anticoagulation dose is Warfarin sodium 5 mg tabs: Use as directed by Anticoagulation Clinic.  The target INR is 2 - 3.  The next INR is due 08/04/2009.  Anticoagulation instructions were given to patient.  Results were reviewed/authorized by Cloyde Reams, RN, BSN.  She was notified by Cloyde Reams RN.         Prior Anticoagulation Instructions: INR 2.6 Continue 5mg  everyday except 7.5mg s on Wednesdays. Recheck in  4 weeks.   Current Anticoagulation Instructions: INR 2.2  Continue on same dosage 1 tablet daily except 1.5 tablets on Wednesdays.  Recheck in 4 weeks.

## 2010-05-04 NOTE — Progress Notes (Signed)
Summary: cough  Phone Note Call from Patient   Caller: Patient Summary of Call: pt daughter left VM that has she runny nose and a cough. called pt daughter back she states that pt c/o dry cough and runny nose x4day with clear nasal discharge.informed her that OV is usually needed for rx but preferred that I have Dr.Kindred Reidinger review this message first to see if he will forward anything to the pharmacy for her. [facility] [address].................Marland KitchenFelecia Deloach CMA  April 21, 2009 3:41 PM   Follow-up for Phone Call        pt daughter aware rx sent to pharmacy, [facility]..........Marland KitchenFelecia Deloach CMA  April 21, 2009 5:08 PM     New/Updated Medications: PROMETHAZINE-CODEINE 6.25-10 MG/5ML SYRP (PROMETHAZINE-CODEINE) take 1 tsp every 6 hours as needed Prescriptions: PROMETHAZINE-CODEINE 6.25-10 MG/5ML SYRP (PROMETHAZINE-CODEINE) take 1 tsp every 6 hours as needed  #120cc x 0   Entered by:   Jeremy Johann CMA   Authorized by:   Marga Melnick MD   Signed by:   Jeremy Johann CMA on 04/21/2009   Method used:   Printed then faxed to ...       [facility] [address] [id number]       [address]       [place], [place]  [address]       Ph: [phone]       Fax: [phone]   RxID:   [id number]

## 2010-05-04 NOTE — Progress Notes (Signed)
Summary: refill  Phone Note Refill Request Message from:  Fax from Pharmacy on February 16, 2010 1:50 PM  Refills Requested: Medication #1:  LORAZEPAM 1 MG TABS 1/2 -1 q 8 hrs as needed [facility] [address] [facility] fax [phone]  Initial call taken by: Okey Regal [place],  February 16, 2010 1:50 PM    Prescriptions: LORAZEPAM 1 MG TABS (LORAZEPAM) 1/2 -1 q 8 hrs as needed agitation  #30 x 1   Entered by:   Shonna Chock CMA   Authorized by:   Marga Melnick MD   Signed by:   Shonna Chock CMA on 02/16/2010   Method used:   Printed then faxed to ...       [facility] [address] [facility]       [address]       [place], [place]  [address]       Ph: [phone]       Fax: [phone]   RxID:   [id number]

## 2010-05-04 NOTE — Medication Information (Signed)
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 no    Recent/future dental: no  Any missed doses?: no       Is patient compliant with meds? yes       Allergies: 1)  ! Ace Inhibitors 2)  ! * Flu Vaccination  Anticoagulation Management History:      The patient is taking warfarin and comes in today for a routine follow up visit.  Positive risk factors for bleeding include an age of 46 years or older and presence of serious comorbidities.  The bleeding index is 'intermediate risk'.  Positive CHADS2 values include History of HTN and History of Diabetes.  Negative CHADS2 values include Age > 47 years old.  The start date was 07/10/2002.  Her last INR was 1.3 RATIO.  Anticoagulation responsible provider: Daleen Squibb MD, Maisie Fus.  INR POC: 1.8.  Cuvette Lot#: [id number].  Exp: 09/2010.    Anticoagulation Management Assessment/Plan:      The patient's current anticoagulation dose is Warfarin sodium 5 mg tabs: Use as directed by Anticoagulation Clinic.  The target INR is 2 - 3.  The next INR is due 09/04/2009.  Anticoagulation instructions were given to patient.  Results were reviewed/authorized by Eda Keys, PharmD.  She was notified by Eda Keys.         Prior Anticoagulation Instructions: INR 2.2  Continue on same dosage 1 tablet daily except 1.5 tablets on Wednesdays.  Recheck in 4 weeks.    Current Anticoagulation Instructions: INR 1.8  Take 1.5 tablets today and then return to normal dosing schedule of 1.5 tablets on Wednesday and 1 tablet all other days.  Return  to clinic in 4 weeks.

## 2010-05-04 NOTE — Medication Information (Signed)
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 no    Recent/future dental: no  Any missed doses?: no       Is patient compliant with meds? yes       Allergies: 1)  ! Ace Inhibitors 2)  ! * Flu Vaccination  Anticoagulation Management History:      The patient is taking warfarin and comes in today for a routine follow up visit.  Positive risk factors for bleeding include an age of 70 years or older and presence of serious comorbidities.  The bleeding index is 'intermediate risk'.  Positive CHADS2 values include History of HTN and History of Diabetes.  Negative CHADS2 values include Age > 51 years old.  The start date was 07/10/2002.  Her last INR was 1.3 RATIO.  Anticoagulation responsible provider: Excell Seltzer MD, Casimiro Needle.  INR POC: 2.4.  Exp: 09/2010.    Anticoagulation Management Assessment/Plan:      The patient's current anticoagulation dose is Warfarin sodium 5 mg tabs: Use as directed by Anticoagulation Clinic.  The target INR is 2 - 3.  The next INR is due 10/02/2009.  Anticoagulation instructions were given to patient.  Results were reviewed/authorized by Weston Brass, PharmD.  She was notified by Weston Brass PharmD.         Prior Anticoagulation Instructions: INR 1.8  Take 1.5 tablets today and then return to normal dosing schedule of 1.5 tablets on Wednesday and 1 tablet all other days.  Return to clinic in 4 weeks.   Current Anticoagulation Instructions: INR 2.4  Continue same dose of 1 tablet every day  except 1 1/2 tablets on Wednesday.

## 2010-05-04 NOTE — Assessment & Plan Note (Signed)
Summary: DEVICE/SAF   History of Present Illness: [name] returns today for followup.  She is a pleasant 70 yo woman with fairly advanced dementia, CHB, s/p PPM, HTN and obesity.  She is also diabetic.  She has had no c/p or sob or peripheral edema.  She is apparently dependent on her daughter for round the clock care.  Current Medications (verified): 1)  Lantus 100 Unit/ml  Soln (Insulin Glargine) .... Use As Directed 40 Units/ml Vial Ave 2)  Pravastatin Sodium 40 Mg Tabs (Pravastatin Sodium) .... Take One Tablet Daily 3)  Klor-Con M20 20 Meq Tbcr (Potassium Chloride Crys Cr) .Marland Kitchen.. 1 By Mouth Two Times A Day 4)  Namenda 10 Mg Tabs (Memantine Hcl) .... Take One Tablet Twice Daily 5)  Aricept 10 Mg Tabs (Donepezil Hcl) .... Take One Tablet By Mouth Every Day 6)  Warfarin Sodium 5 Mg Tabs (Warfarin Sodium) .... Use As Directed By Anticoagulation Clinic 7)  Diovan 320  Mg  Tabs (Valsartan) .... Take One Tablet By Mouth Every Day 8)  [facility]vs Ultra Thin Lancets  Misc (Lancets) .... Pt Test 1-2 Times A Day 9)  Freestyle Lite Test  Strp (Glucose Blood) .... Pt Test 1-2 Times A Day 10)  Lorazepam 1 Mg Tabs (Lorazepam) .... 1/2 -1 Q 8 Hrs As Needed Agitation 11)  Metformin Hcl 500 Mg Xr24h-Tab (Metformin Hcl) .Marland Kitchen.. 1 Two Times A Day With 2 Largest Meals (Not Pre Meal)  Allergies: 1)  ! Ace Inhibitors 2)  ! * Flu Vaccination  Past History:  Past Medical History: Last updated: 02/08/2008 DYSPHAGIA, UNSPECIFIED (ICD-787.20) DIABETES-TYPE 2 (ICD-250.00) Family Hx of ALZHEIMER'S DISEASE (ICD-331.0) Family Hx of DIABETES MELLITUS, FAMILY HX (ICD-V18.0) HYPOKALEMIA (ICD-276.8) ALZHEIMER'S DISEASE (ICD-331.0) ANEMIA-NOS (ICD-285.9)  Past Surgical History: Last updated: 02/08/2008 gravid 9 para 8 diabetes syncope pacer 05/2001 multiple infarcts 06/27/2002 to 07/03/2002 Pacer 2003 for syncope ; replaced 2009  Review of Systems  The patient denies chest pain, syncope, dyspnea on exertion, and  peripheral edema.    Vital Signs:  Patient profile:   70 year old female Weight:      209 pounds Pulse rate:   66 / minute BP sitting:   106 / 76  (left arm)  Vitals Entered By: Laurance Flatten CMA (October 06, 2009 2:32 PM) Comments Exposed to 2nd hand smoke.   Physical Exam  General:  in no acute distress;  not interactive verbally  but cooperative throughout examination Mouth:  Oral mucosa and oropharynx without lesions or exudates.  Dentures  ill fitting & extruding  Neck:  No deformities, masses, or tenderness noted. Chest Wall:  Well healed PPM incision. Lungs:  Normal respiratory effort, chest expands symmetrically. Lungs are clear to auscultation, no crackles or wheezes. Heart:  regular rhythm, no murmur, no gallop, no rub, no JVD, and bradycardia.  S4 Abdomen:  Bowel sounds positive,abdomen soft and non-tender without masses, organomegaly or hernias noted.Protuberant Msk:  No deformity or scoliosis noted of thoracic or lumbar spine.   Pulses:  R and L carotid,radial  pulses are full and equal bilaterally. Slightly decreased pedal pulses Extremities:  No clubbing, cyanosis, edema, or deformity noted .Good nail health but L great nail long Neurologic:  DTRs symmetrical and normal ; plesantly  confused. Unable to give date or President  .Sensation normal over feet   PPM Specifications Following MD:  Lewayne Bunting, MD     PPM Vendor:  [employer]     PPM Model Number:  [id number]  PPM Serial Number:  [id number] PPM DOI:  05/02/2007      Lead 1    Location: [facility]     DOI: 01/15/2002     Model #: [id number]     Serial #: [id number]     Status: active Lead 2    Location: [facility]     DOI: 01/15/2002     Model #: 1488TC     Serial #: [id number]     Status: active   Indications:  BRADY    PPM Follow Up Pacer Dependent:  No      Episodes Coumadin:  No  Parameters Mode:  DDD     Lower Rate Limit:  50     Upper Rate Limit:  95 Paced AV Delay:  350     Sensed AV Delay:  325 MD Comments:  Normal device  function.  Impression & Recommendations:  Problem # 1:  CARDIAC PACEMAKER IN SITU (ICD-V45.01) Her PPM is working normally.  Will recheck in several months.  Problem # 2:  ESSENTIAL HYPERTENSION, BENIGN (ICD-401.1) Her blood pressure is well controlled today.  She will continue her current meds.  Problem # 3:  DYSLIPIDEMIA (ICD-272.4) She will continue her meds as below.  A low fat diet will be continued. She needs to lose weight. Her updated medication list for this problem includes:    Pravastatin Sodium 40 Mg Tabs (Pravastatin sodium) .Marland Kitchen... Take one tablet daily

## 2010-05-04 NOTE — Progress Notes (Signed)
Summary: Lab results  Phone Note Outgoing Call Call back at [facility] Phone [phone]   Call placed by: Shonna Chock,  September 18, 2009 12:20 PM Call placed to: Patient Summary of Call: Spoke with patient's daughter (copy of labs mailed) Preferred A1c is 7-8%. Please increase Lantus from 37 units to 40 units. Add Metformin 500 mg two times a day with 2 largest meals. Recheck labs in 10 weeks (A1c, BUN, creat, K+; 250.02,995.20).  Scheduled appointment to recheck labs in Aug 2011  Chrae Malloy  September 18, 2009 12:20 PM

## 2010-05-04 NOTE — Medication Information (Signed)
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 Site: [facility] INR POC 2.6 INR RANGE 2 - 3  Dietary changes: no    Health status changes: no    Bleeding/hemorrhagic complications: no    Recent/future hospitalizations: no    Any changes in medication regimen? no    Recent/future dental: no  Any missed doses?: no       Is patient compliant with meds? yes       Allergies: 1)  ! Ace Inhibitors 2)  ! * Flu Vaccination  Anticoagulation Management History:      The patient is taking warfarin and comes in today for a routine follow up visit.  Positive risk factors for bleeding include an age of 70 years or older and presence of serious comorbidities.  The bleeding index is 'intermediate risk'.  Positive CHADS2 values include History of HTN and History of Diabetes.  Negative CHADS2 values include Age > 2 years old.  The start date was 07/10/2002.  Her last INR was 1.3 RATIO.  Anticoagulation responsible provider: Jens Som MD, Arlys John.  INR POC: 2.6.  Cuvette Lot#: [id number].  Exp: 08/2010.    Anticoagulation Management Assessment/Plan:      The patient's current anticoagulation dose is Warfarin sodium 5 mg tabs: Use as directed by Anticoagulation Clinic.  The target INR is 2 - 3.  The next INR is due 07/03/2009.  Anticoagulation instructions were given to patient.  Results were reviewed/authorized by Bethena Midget, RN, BSN.  She was notified by Bethena Midget, RN, BSN.         Prior Anticoagulation Instructions: INR 2.7 Continue 5mg s everyday except 7.5mg s on Wednesdays. Recheck in 4 weeks.   Current Anticoagulation Instructions: INR 2.6 Continue 5mg  everyday except 7.5mg s on Wednesdays. Recheck in  4 weeks.

## 2010-05-04 NOTE — Medication Information (Signed)
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 no    Recent/future dental: no  Any missed doses?: no       Is patient compliant with meds? yes       Allergies: 1)  ! Ace Inhibitors 2)  ! * Flu Vaccination  Anticoagulation Management History:      The patient is taking warfarin and comes in today for a routine follow up visit.  Positive risk factors for bleeding include an age of 70 years or older and presence of serious comorbidities.  The bleeding index is 'intermediate risk'.  Positive CHADS2 values include History of HTN and History of Diabetes.  Negative CHADS2 values include Age > 70 years old.  The start date was 07/10/2002.  Her last INR was 1.3 RATIO.  Anticoagulation responsible provider: Bensimhon MD, Reuel Boom.  INR POC: 2.3.  Cuvette Lot#: [id number].  Exp: 02/2011.    Anticoagulation Management Assessment/Plan:      The patient's current anticoagulation dose is Warfarin sodium 5 mg tabs: Use as directed by Anticoagulation Clinic.  The target INR is 2 - 3.  The next INR is due 02/04/2010.  Anticoagulation instructions were given to patient.  Results were reviewed/authorized by Bethena Midget, RN, BSN.  She was notified by Bethena Midget, RN, BSN.         Prior Anticoagulation Instructions: INR 2.3  Continue on same dosage 1 tablet daily except 1.5 tablet on Wednesdays.  Recheck in 4 weeks.    Current Anticoagulation Instructions: INR 2.3 Continue 5mg s daily except 7.5mg s on Wednesdays. Recheck in 4 weeks.

## 2010-05-04 NOTE — Assessment & Plan Note (Signed)
Summary: fasting roa//lch   Vital Signs:  Patient profile:   70 year old female Weight:      207.8 pounds Pulse rate:   64 / minute Resp:     17 per minute BP sitting:   118 / 70  (left arm) Cuff size:   large  Vitals Entered By: Shonna Chock (September 10, 2009 9:37 AM) CC: Follow-up visit: BS running high > 200 @ times,  170 this morning Comments REVIEWED MED LIST, PATIENT AGREED DOSE AND INSTRUCTION CORRECT    CC:  Follow-up visit: BS running high > 200 @ times and 170 this morning.  History of Present Illness: FBS are averaging 200 each am X 2&1/2 weeks despite 37 units of Lantus. Eating late; no diet. No CVE. Weight ?;no hypoglycemia. Her daughter is historian . As in past; her husband angrily contradicts history provided. No Podiatry visit this year. Role of HFCS sugar in weight gain discussed with daughter who does grocery shopping. PMH of anemia; stool dark.  Allergies: 1)  ! Ace Inhibitors 2)  ! * Flu Vaccination  Review of Systems General:  Complains of fatigue; denies weight loss. Eyes:  Denies blurring, double vision, and vision loss-both eyes; last exam < 1 month; no retinopathy. CV:  Denies chest pain or discomfort, lightheadness, near fainting, shortness of breath with exertion, swelling of feet, and swelling of hands. GI:  Denies abdominal pain, bloody stools, and indigestion; ? melena;frequent belching. GU:  No bloating. Derm:  Denies poor wound healing. Neuro:  Complains of memory loss; denies numbness and tingling; Misplacing  utensils . Psych:  Complains of easily angered and irritability; Symptoms better with medicine . Endo:  Complains of excessive thirst and excessive urination; denies excessive hunger.  Physical Exam  General:  in no acute distress;  not interactive verbally  but cooperative throughout examination Mouth:  Oral mucosa and oropharynx without lesions or exudates.  Dentures  ill fitting & extruding  Neck:  No deformities, masses, or tenderness  noted. Lungs:  Normal respiratory effort, chest expands symmetrically. Lungs are clear to auscultation, no crackles or wheezes. Heart:  regular rhythm, no murmur, no gallop, no rub, no JVD, and bradycardia.  S4 Pulses:  R and L carotid,radial  pulses are full and equal bilaterally. Slightly decreased pedal pulses Extremities:  No clubbing, cyanosis, edema, or deformity noted .Good nail health but L great nail long Neurologic:  DTRs symmetrical and normal ; plesantly  confused. Unable to give date or President  .Sensation normal over feet Skin:  Intact without suspicious lesions or rashes Psych:  flat affect, subdued, and poor eye contact.     Impression & Recommendations:  Problem # 1:  DIAB W/O COMP TYPE II/UNS NOT STATED UNCNTRL (ICD-250.00)  FBS 200 range Her updated medication list for this problem includes:    Lantus 100 Unit/ml Soln (Insulin glargine) ..... 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 melena Orders: Venipuncture (47096) TLB-CBC Platelet - w/Differential (  85025-CBCD)  Complete Medication List: 1)  Lantus 100 Unit/ml Soln (Insulin glargine) .... Use as directed 37 units/ml vial ave, due for labs 2)  Pravastatin Sodium 40 Mg Tabs (Pravastatin sodium) .... Take one tablet daily 3)  Klor-con M20  20 Meq Tbcr (Potassium chloride crys cr) .Marland Kitchen.. 1 by mouth two times a day 4)  Namenda 10 Mg Tabs (Memantine hcl) .... Take one tablet twice daily 5)  Aricept 10 Mg Tabs (Donepezil hcl) .... Take one tablet by mouth every day 6)  Warfarin Sodium 5 Mg Tabs (Warfarin sodium) .... Use as directed by anticoagulation clinic 7)  Diovan 320 Mg Tabs (valsartan)  .... Take one tablet by mouth every day 8)  Cvs Ultra Thin Lancets Misc (Lancets) .... Pt test 1-2 times a day 9)  Freestyle Lite Test Strp (Glucose blood) .... Pt test 1-2 times a day 10)  Lorazepam 1 Mg Tabs (Lorazepam) .... 1/2 -1 q 8 hrs as needed agitation  Patient Instructions: 1)  Consume LESS THAN 40 grams of "sugar"/ day from foods & drinks with High Fructose Corn Syrup as #1, 2 or # 3 on label.Make Podiatry appt for nail care.

## 2010-05-06 NOTE — Medication Information (Signed)
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 no    Recent/future dental: no  Any missed doses?: no       Is patient compliant with meds? yes      Comments: Pt's daughter who takes care of mother has been out of town.  May have had some slight differences in diet.  Pt requests appt in Feb due to financial issues.   Allergies: 1)  ! Ace Inhibitors 2)  ! * Flu Vaccination  Anticoagulation Management History:      The patient is taking warfarin and comes in today for a routine follow up visit.  Positive risk factors for bleeding include an age of 70 years or older and presence of serious comorbidities.  The bleeding index is 'intermediate risk'.  Positive CHADS2 values include History of HTN and History of Diabetes.  Negative CHADS2 values include Age > 64 years old.  The start date was 07/10/2002.  Her last INR was 1.3 RATIO.  Anticoagulation responsible provider: Jens Som MD, Arlys John.  INR POC: 1.8.  Cuvette Lot#: [id number].  Exp: 05/2011.    Anticoagulation Management Assessment/Plan:      The patient's current anticoagulation dose is Warfarin sodium 5 mg tabs: Use as directed by Anticoagulation Clinic.  The target INR is 2 - 3.  The next INR is due 05/07/2010.  Anticoagulation instructions were given to patient.  Results were reviewed/authorized by Weston Brass, PharmD.  She was notified by Weston Brass PharmD.         Prior Anticoagulation Instructions: INR 2.6 Continue 1 pill everyday except 1.5 pills on Wednesdays. Recheck in 4 weeks.   Current Anticoagulation  Instructions: INR 1.8  Take an extra 1/2 tablet today then resume same dose of 1 tablet every day except 1 1/2 tablets on Wednesday.  Recheck INR in 4 weeks.

## 2010-05-07 ENCOUNTER — Encounter (INDEPENDENT_AMBULATORY_CARE_PROVIDER_SITE_OTHER): Payer: MEDICARE

## 2010-05-07 ENCOUNTER — Ambulatory Visit: Admit: 2010-05-07 | Payer: Self-pay

## 2010-05-07 ENCOUNTER — Encounter: Payer: Self-pay | Admitting: Cardiology

## 2010-05-07 DIAGNOSIS — I749 Embolism and thrombosis of unspecified artery: Secondary | ICD-10-CM

## 2010-05-07 DIAGNOSIS — Z7901 Long term (current) use of anticoagulants: Secondary | ICD-10-CM

## 2010-05-07 LAB — CONVERTED CEMR LAB: POC INR: 2.2

## 2010-05-12 NOTE — Medication Information (Signed)
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   Bleeding/hemorrhagic complications: no    Recent/future hospitalizations: no    Any changes in medication regimen? no    Recent/future dental: no  Any missed doses?: no       Is patient compliant with meds? yes       Allergies: 1)  ! Ace Inhibitors 2)  ! * Flu Vaccination  Anticoagulation Management History:      The patient is taking warfarin and comes in today for a routine follow up visit.  Positive risk factors for bleeding include an age of 24 years or older and presence of serious comorbidities.  The bleeding index is 'intermediate risk'.  Positive CHADS2 values include History of HTN and History of Diabetes.  Negative CHADS2 values include Age > 66 years old.  The start date was 07/10/2002.  Her last INR was 1.3 RATIO.  Anticoagulation responsible provider: Jens Som MD, Arlys John.  INR POC: 2.2.  Cuvette Lot#: [id number].  Exp: 05/2011.    Anticoagulation Management Assessment/Plan:      The patient's current anticoagulation dose is Warfarin sodium 5 mg tabs: Use as directed by Anticoagulation Clinic.  The target INR is 2 - 3.  The next INR is due 06/04/2010.  Anticoagulation instructions were given to patient.  Results were reviewed/authorized by Cloyde Reams, RN, BSN.  She was notified by Cloyde Reams RN.         Prior Anticoagulation Instructions: INR 1.8  Take an extra 1/2 tablet today then resume same dose of 1 tablet every day except 1 1/2 tablets on Wednesday.  Recheck INR in 4 weeks.   Current Anticoagulation Instructions: INR 2.2  Continue on same dosage 1 tablet daily  except 1.5 tablets on Wednesdays.  Recheck in 4 weeks.

## 2010-06-02 ENCOUNTER — Encounter: Payer: Self-pay | Admitting: Internal Medicine

## 2010-06-02 DIAGNOSIS — D735 Infarction of spleen: Secondary | ICD-10-CM

## 2010-06-02 DIAGNOSIS — Z7901 Long term (current) use of anticoagulants: Secondary | ICD-10-CM | POA: Insufficient documentation

## 2010-06-04 ENCOUNTER — Encounter (INDEPENDENT_AMBULATORY_CARE_PROVIDER_SITE_OTHER): Payer: Medicare Other

## 2010-06-04 ENCOUNTER — Encounter: Payer: Self-pay | Admitting: Cardiovascular Disease

## 2010-06-04 DIAGNOSIS — Z7901 Long term (current) use of anticoagulants: Secondary | ICD-10-CM

## 2010-06-04 DIAGNOSIS — I82619 Acute embolism and thrombosis of superficial veins of unspecified upper extremity: Secondary | ICD-10-CM

## 2010-06-04 LAB — CONVERTED CEMR LAB: POC INR: 2.1

## 2010-06-10 NOTE — Medication Information (Signed)
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 no    Recent/future dental: no  Any missed doses?: no       Is patient compliant with meds? yes       Allergies: 1)  ! Ace Inhibitors 2)  ! * Flu Vaccination  Anticoagulation Management History:      The patient is taking warfarin and comes in today for a routine follow up visit.  Positive risk factors for bleeding include an age of 44 years or older and presence of serious comorbidities.  The bleeding index is 'intermediate risk'.  Positive CHADS2 values include History of HTN and History of Diabetes.  Negative CHADS2 values include Age > 60 years old.  The start date was 07/10/2002.  Her last INR was 1.3 RATIO.  Anticoagulation responsible provider: Clifton James MD, Cristal Deer.  INR POC: 2.1.  Cuvette Lot#: [id number].  Exp: 05/2011.    Anticoagulation Management Assessment/Plan:      The patient's current anticoagulation dose is Warfarin sodium 5 mg tabs: Use as directed by Anticoagulation Clinic.  The target INR is 2 - 3.  The next INR is due 07/06/2010.  Anticoagulation instructions were given to patient.  Results were reviewed/authorized by Bethena Midget, RN, BSN.  She was notified by Bethena Midget, RN, BSN.         Prior Anticoagulation Instructions: INR 2.2  Continue on same dosage 1 tablet daily except 1.5 tablets on Wednesdays.  Recheck in 4 weeks.    Current Anticoagulation Instructions: INR 2.1 Continue 1 pill everyday except 1.5 pills on Wednesdays. Recheck in 4 weeks.

## 2010-07-05 ENCOUNTER — Ambulatory Visit (INDEPENDENT_AMBULATORY_CARE_PROVIDER_SITE_OTHER): Payer: Medicare Other | Admitting: *Deleted

## 2010-07-05 ENCOUNTER — Other Ambulatory Visit: Payer: Self-pay | Admitting: Internal Medicine

## 2010-07-05 DIAGNOSIS — D7389 Other diseases of spleen: Secondary | ICD-10-CM

## 2010-07-05 DIAGNOSIS — Z1231 Encounter for screening mammogram for malignant neoplasm of breast: Secondary | ICD-10-CM

## 2010-07-05 DIAGNOSIS — D735 Infarction of spleen: Secondary | ICD-10-CM

## 2010-07-05 DIAGNOSIS — Z7901 Long term (current) use of anticoagulants: Secondary | ICD-10-CM

## 2010-07-05 LAB — POCT INR: INR: 1.9

## 2010-07-05 NOTE — Patient Instructions (Signed)
INR 1.9 Today take a total of 1 1/2 tablets.  Then resume taking 1 tablet (5mg ) daily, except take 1/2 tablet on Wednesdays. Recheck in 4 weeks.

## 2010-07-06 ENCOUNTER — Encounter: Payer: Medicare Other | Admitting: *Deleted

## 2010-07-07 ENCOUNTER — Ambulatory Visit
Admission: RE | Admit: 2010-07-07 | Discharge: 2010-07-07 | Disposition: A | Discharge status: Home / Self Care | Payer: Medicare Other | Source: Ambulatory Visit | Attending: Internal Medicine | Admitting: Internal Medicine

## 2010-07-07 DIAGNOSIS — Z1231 Encounter for screening mammogram for malignant neoplasm of breast: Secondary | ICD-10-CM

## 2010-07-12 ENCOUNTER — Other Ambulatory Visit: Payer: Self-pay | Admitting: *Deleted

## 2010-07-12 MED ORDER — LORAZEPAM 1 MG PO TABS: ORAL_TABLET | ORAL | Status: DC

## 2010-07-19 ENCOUNTER — Other Ambulatory Visit: Payer: Self-pay | Admitting: Internal Medicine

## 2010-08-05 ENCOUNTER — Ambulatory Visit (INDEPENDENT_AMBULATORY_CARE_PROVIDER_SITE_OTHER): Payer: Medicare Other | Admitting: *Deleted

## 2010-08-05 DIAGNOSIS — D7389 Other diseases of spleen: Secondary | ICD-10-CM

## 2010-08-05 DIAGNOSIS — D735 Infarction of spleen: Secondary | ICD-10-CM

## 2010-08-05 DIAGNOSIS — Z7901 Long term (current) use of anticoagulants: Secondary | ICD-10-CM

## 2010-08-16 ENCOUNTER — Ambulatory Visit (INDEPENDENT_AMBULATORY_CARE_PROVIDER_SITE_OTHER): Payer: Medicare Other | Admitting: Internal Medicine

## 2010-08-16 ENCOUNTER — Encounter: Payer: Self-pay | Admitting: Internal Medicine

## 2010-08-16 DIAGNOSIS — E119 Type 2 diabetes mellitus without complications: Secondary | ICD-10-CM

## 2010-08-16 DIAGNOSIS — Z7901 Long term (current) use of anticoagulants: Secondary | ICD-10-CM

## 2010-08-16 DIAGNOSIS — F0280 Dementia in other diseases classified elsewhere without behavioral disturbance: Secondary | ICD-10-CM

## 2010-08-16 NOTE — Progress Notes (Signed)
  Subjective:    Patient ID: [name], female    DOB: 22-Feb-1941, 70 y.o.   MRN: [id number]  HPI Here with her daughter. The daughter reports a gradual decline in her mobility, here lately she has been complaining about leg pain, patient is unable to give details due to dementia. Her lower extremity strength has also been decreased. She is having less control of her bowel movements than before. She has chronic urinary incontinence. Her behavior is more erratic and sometimes violent, from time to time she has been unable to recognize her husband which she always did before. She has sweats from time to time at different times of the day. They have not checked her blood sugars whenever she sweats.  Past Medical History  Diagnosis Date  . Dysphagia   . Diabetes mellitus   . Alzheimer disease   . Hypokalemia   . Anemia   . 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 Syncope pacer 05/2001   History   Social History  . Marital Status: Married    Spouse Name: N/A    Number of Children: N/A  . Years of Education: N/A   Occupational History  . Not on file.   Social History Main Topics  . Smoking status: Never Smoker   . Smokeless tobacco: Not on file  . Alcohol Use: Not on file  . Drug Use: No  . Sexually Active: Not on file   Other Topics Concern  . Not on file   Social History Narrative   Household includes her husband, one daughter, one granddaughter, one Physicist, medical and a son who is a Ecologist and spends most of the time outside the house.      Review of Systems No apparent fevers Has not complained about dysuria or gross hematuria Has not complained about chest pain or shortness of breath. No edema has been observed. No headaches She sleeps more than usual. Compliance with medication, daughter makes sure she takes as prescribed.    Objective:   Physical Exam Alert, no apparent distress, cooperative and follow simple  commands. Lungs clear to auscultation bilaterally. Cardiovascular regular rate and rhythm without a murmur. Abdomen without mass or rebound. Extremities without edema. Neurological exam, she is pleasantly demented. Ambulation today seems appropriate for her age. No shuffling, no wide gait       Assessment & Plan:

## 2010-08-16 NOTE — Assessment & Plan Note (Addendum)
Presents with a gradual decline in her mobility. Interview and review of systems quite limited due to advanced dementia. I think that overall her dementia is getting worse. We'll check a urinalysis to rule out infection. We'll also check some basic labs to rule out anemia or a electrolyte imbalance. Will refer to home health agency, I think she will qualify for some assistance.  At some point she may need placement to a NH. Will ask the [facility] agency to contact daughter [facility] [phone]

## 2010-08-16 NOTE — Assessment & Plan Note (Signed)
Last hemoglobin A1c 8.3. We'll recheck the A1c.  Reasonable goal should be an A1c of around 8.0. I also recommended the daughter  to check her CBGs whenever she is sweaty (hypoglycemia?)

## 2010-08-16 NOTE — Assessment & Plan Note (Signed)
Per the Coumadin clinic

## 2010-08-17 ENCOUNTER — Other Ambulatory Visit: Payer: Self-pay | Admitting: Internal Medicine

## 2010-08-17 LAB — CBC WITH DIFFERENTIAL/PLATELET
Basophils Relative: 2.6 % (ref 0.0–3.0)
Eosinophils Relative: 3 % (ref 0.0–5.0)
HCT: 35.7 % — ABNORMAL LOW (ref 36.0–46.0)
Monocytes Relative: 12.6 % — ABNORMAL HIGH (ref 3.0–12.0)
Neutrophils Relative %: 30.3 % — ABNORMAL LOW (ref 43.0–77.0)
Platelets: 188 10*3/uL (ref 150.0–400.0)
RBC: 4.11 Mil/uL (ref 3.87–5.11)
WBC: 4.4 10*3/uL — ABNORMAL LOW (ref 4.5–10.5)

## 2010-08-17 LAB — ALT: ALT: 14 U/L (ref 0–35)

## 2010-08-17 LAB — BASIC METABOLIC PANEL
BUN: 19 mg/dL (ref 6–23)
CO2: 29 mEq/L (ref 19–32)
Glucose, Bld: 182 mg/dL — ABNORMAL HIGH (ref 70–99)
Potassium: 4.7 mEq/L (ref 3.5–5.1)
Sodium: 140 mEq/L (ref 135–145)

## 2010-08-17 LAB — HEMOGLOBIN A1C: Hgb A1c MFr Bld: 7.6 % — ABNORMAL HIGH (ref 4.6–6.5)

## 2010-08-17 LAB — AST: AST: 16 U/L (ref 0–37)

## 2010-08-17 NOTE — Assessment & Plan Note (Signed)
[facility]                         ELECTROPHYSIOLOGY OFFICE NOTE   JANITZA, REVUELTA                        MRN:          [id number]  DATE:08/14/2007                            DOB:          1940/10/29    [name] returns today for follow-up.  She is a very pleasant 70-year-  old woman with symptomatic bradycardia, status post pacemaker insertion,  who was found to be at [facility] and underwent generator change back in  January.  She returns today for follow-up.  She had no specific  complaints today.  Her daughter notes that sometimes she sweats but no  fevers or chills noted.   EXAM:  She is a pleasant 70 year old woman in her usual state of health.  She has a history of some dementia and some, I suspect, tardive  dyskinesia, as she constantly has oral movements and spits out her  dentures.  On physical exam she is a pleasant, middle-aged woman in no  distress.  Blood pressure was 108/95, the pulse 56 and regular, respirations were  18.  The weight was not recorded.  NECK:  No jugular venous distention.  LUNGS:  Clear bilaterally to auscultation.  No wheezes, rales or rhonchi  are present.  CARDIOVASCULAR:  Regular rate and rhythm.  Normal S1 and S2.  EXTREMITIES:  No edema.   Interrogation of her pacemaker demonstrates a Engineer, structural.  The P  waves are 2 and the R waves are 5, the impedance 574 in the atrium and  582 in the ventricle, the threshold 0.75 at 0.4 in the A, 1.875 at 0.6  in the RV.  The battery voltage was 2.8 V.  There were no mode-switching  episodes noted.   IMPRESSION:  1. Symptomatic bradycardia.  2. Status post pacemaker insertion with recent generator change.  3. Hypertension.   DISCUSSION:  Overall Ms. Mickler is stable.  Her pacemaker is working  normally.  We will see her back in the office again in January 2010.     Doylene Canning. Ladona Ridgel, MD  Electronically Signed    GWT/MedQ  DD: 08/14/2007  DT: 08/14/2007  Job #:  [id number]

## 2010-08-17 NOTE — Assessment & Plan Note (Signed)
[facility]                         ELECTROPHYSIOLOGY OFFICE NOTE   TRINI, SOLDO                        MRN:          [id number]  DATE:05/01/2007                            DOB:          06-12-1940    HISTORY:  Ms. Larocque returns today for followup.  She is a very pleasant  elderly woman with severe dementia, symptomatic bradycardia,  hypertension and atrial arrhythmias, who returns today for pacemaker  followup.  The patient has been lost to followup for over 1-1/2 years.  Most recently, she saw Dr. Graciela Husbands in the office, but overall has been  infrequent with her clinic visits.  The husband states that her  Alzheimer's dementia is still present.  The patient cannot provide any  symptoms, but her family states that otherwise she has had no obvious  syncopal episodes.   MEDICATIONS:  1. Potassium supplement.  2. Diovan 160 a day.  3. Prilosec daily.  4. Aricept 10 daily.  5. Coumadin as directed.   PHYSICAL EXAMINATION:  GENERAL:  She is a pleasant elderly-appearing  woman in no acute distress.  She answers simple questions very easily.  VITAL SIGNS:  Her weight was 196 pounds.  Blood pressure was 130/80,  pulse was 64 and regular, respirations were 18.  NECK:  Revealed no jugular venous distention.  LUNGS:  Clear bilaterally to auscultation.  No wheezes, rales or rhonchi  were present.  CARDIOVASCULAR:  Regular rate and rhythm.  Normal S1-S2.  There are no murmurs, rubs or gallops that I can appreciate.  ABDOMINAL:  Soft, nontender, nondistended.  There is no organomegaly.  EXTREMITIES:  Demonstrated no cyanosis, clubbing or edema.  Pulses were  2+ and symmetric.   PROCEDURE:  1. Interrogation of her pacemaker demonstrates a Corporate treasurer.      There was no communication with the device secondary to its being      beyond end-of-life.  2. The EKG demonstrates sinus rhythm at 63 beats per minute.   IMPRESSION:  1. Symptomatic  bradycardia.  2. Paroxysmal atrial fibrillation.  3. Chronic Coumadin therapy.  4. Status post pacemaker now beyond end-of-life and not working      whatsoever.   DISCUSSION:  Overall Ms. Kuehnel is stable.  I discussed with the family  whether or not to proceed with pacemaker generator change as the patient  does have fairly significant dementia and has not had any symptoms  despite her pacemaker not working now for at least 3 months.  With this  being said, the patient and the patient's family would like to proceed  with pacemaker  generator change.  The risks, benefits, goals and expectations of the  procedure have been discussed.  We will schedule the patient to come in  the next day or so to have her device changed out.     Doylene Canning. Ladona Ridgel, MD  Electronically Signed    GWT/MedQ  DD: 05/01/2007  DT: 05/01/2007  Job #: [id number]

## 2010-08-17 NOTE — Assessment & Plan Note (Signed)
[facility]                                 ON-CALL NOTE   [name], Kristy                          MRN:          [id number]  DATE:06/30/2007                            DOB:          02/25/1941    The patient's daughter, [name], called from [phone] at 10:13 p.m.  on June 30, 2007.  Patient of Dr. Alwyn Ren.  She just called to let us  know that her blood sugar was down to 147.  She wanted to know if there  was anything else she could do.  She had called earlier stating that she  had been without her insulin for several days.  I told her that was  good, that it was coming down and as long as she was asymptomatic to let  her sleep tonight, check on her before she went to bed and then check  the blood sugar first thing in the morning, make sure she gives her  insulin every day.     Lelon Perla, DO  Electronically Signed    [name]  DD: 06/30/2007  DT: 07/01/2007  Job #: [id number]

## 2010-08-17 NOTE — Op Note (Signed)
[name], [name]                 ACCOUNT NO.:  [id number]   MEDICAL RECORD NO.:  [id number]          PATIENT TYPE:  OIB   LOCATION:  NA                           FACILITY:  [facility]   PHYSICIAN:  Doylene Canning. Ladona Ridgel, MD    DATE OF BIRTH:  Mar 06, 1941   DATE OF PROCEDURE:  DATE OF DISCHARGE:                               OPERATIVE REPORT   PROCEDURE PERFORMED:  Removal of previous implanted dual-chamber  pacemaker which is at end-of-life and nonfunctioning and insertion of a  new dual-chamber device.   INTRODUCTION:  The patient is a 70 year old woman with severe dementia  who was status post pacemaker insertion for bradycardia approximately 6  or 7 ago.  She was last seen in the office over a year and half ago and  failed to show back up for follow-up and now presents with her device  which his dead for pacemaker reinsertion.   PROCEDURE:  After informed consent was obtained the patient is taken  diagnostic EP lab in fasting state.  After usual preparation and  draping, 30 mL of lidocaine was infiltrated over the old pacemaker  insertion site.  A 5 cm incision was carried out over this region.  Electrocautery utilized to dissect down the fascial plane.  The  pacemaker pocket was entered without difficulty.  The device was removed  from the pocket without difficulty and the leads were removed from the  device.  The patient was not pacemaker dependent.  The atrial lead was a  model 1488T 46-cm lead serial number was [id number], the ventricular lead  was model 1488T 52-cm lead serial number [id number].  The P-waves measured  3, the R-waves were 9, impedance 496 the atrium and 573 in the  ventricle.  Threshold was 0.5 with  0.5 in the atrium and 2 at 0.5 in  the ventricle.  That was bipolar.  The threshold in the unipolar was 2  at 0.5 as well.  It should be noted that both bipolar and unipolar  pacing resulted in intermittent diaphragmatic stimulation.  This was  much reduced however near the  patient's pacemaker threshold.  Because of  her severe dementia and because her PR interval was fairly short with  the thought being the likelihood of ventricular pacing being very  minimal it was deemed most appropriate not to put a new ventricular lead  in secondary to her multiple medical problems and having accomplished  this we connected to the new Zephyr XL DR dual-chamber device serial  number [id number] and placed both the atrial and ventricular leads back in  the subcutaneous pocket.  The pocket was irrigated with kanamycin and  the incision then closed with layer of 2-0 Vicryl followed by layer of 3-  0 Vicryl.   PROCEDURE COMPLICATIONS:  There no immediate procedure complications.   RESULTS:  Demonstrate successful removal of previous implanted dual-  chamber pacemaker, insertion of new device without immediate procedure  complication.      Doylene Canning. Ladona Ridgel, MD  Electronically Signed     GWT/MEDQ  D:  05/02/2007  T:  05/03/2007  Job:  [id number]   cc:   Titus Dubin. Alwyn Ren, MD,FACP,FCCP

## 2010-08-17 NOTE — Discharge Summary (Signed)
NAMEJONTE, WOLLAM                 ACCOUNT NO.:  [id number]   MEDICAL RECORD NO.:  [id number]          PATIENT TYPE:  OIB   LOCATION:  2854                         FACILITY:  [facility]   PHYSICIAN:  Doylene Canning. Ladona Ridgel, MD    DATE OF BIRTH:  1940-10-29   DATE OF ADMISSION:  05/02/2007  DATE OF DISCHARGE:  05/03/2007                               DISCHARGE SUMMARY   This patient has no known drug allergies.   Dictation and time for the exam greater than 30 minutes.   FINAL DIAGNOSES:  1. Pacemaker implanted for syncope and complete heart block at end-of-      life.  2. Discharging after explantation of St. Jude Identity permanent      pacemaker with implant of St. Jude Zephyr XL DR dual-chamber      pacemaker.   SECONDARY DIAGNOSES:  1. Dementia.  2. Symptomatic bradycardia.  3. Hypertension.  4. Atrial arrhythmias.  5. Pacemaker implanted for complete heart block syncope.  6. Pacemaker lost to follow-up, pacemaker not working at examination      January 27 for least 3 months.   PROCEDURE:  May 02, 2007, explantation of existing St. Jude Identity  dual-chamber pacemaker with implant of St. Jude Zephyr XL DR dual-  chamber pacemaker, Dr. Lewayne Bunting.  The patient has had no post-  procedural complications.   BRIEF HISTORY:  Ms. Sheets is a 70 year old female.  She has severe  dementia.  She returns for a pacemaker follow-up.  She has apparently  not been to the office for over 1-1/2 years.  The patient cannot provide  any symptoms currently, but the family says she had no obvious syncopal  episodes.  Her pacemaker was interrogated.  There was no communication  with the device.  It is beyond end-of-life.  Electrocardiogram shows  sinus rhythm at 63 beats a minute.  The patient is on chronic Coumadin  therapy.   Discussion with the family who would like to proceed with pacemaker  implantation.  Risks, benefits and goals have been described to the  patient and family, and  schedule will be generator change out May 02, 2007 at [facility]  At discharge, the patient and family  are asked to remove the bandage in the morning, Thursday, January 29, to  leave the incision open to the air.  They are also to keep the incision  dry for next 7 days and to sponge bathe until Wednesday, February 4.  The patient goes on antibiotic, Keflex 500 mg 1 tablet 1/2 hour before  breakfast, lunch, dinner and bedtime for the next 5 days.  She will  resume her pre-admission medications.  1. Potassium 20 mEq twice daily.  2. Divan 160 mg daily.  3. Prilosec 20 mg daily.  4. Aricept 10 mg daily.  5. Iron 65 mg daily.  6. Coumadin as before this admission.   She has follow-up appointment with [facility] pacer clinic  Wednesday, February 11, at 10:00.   Laboratory studies pertinent to this admission were drawn on January 27.  Complete blood count:  White cells 5.5, hemoglobin 11.9, hematocrit 37,  and platelets of 220.  Protime is 13.6, INR 1.3.  Sodium was 138,  potassium 4.5, chloride 101, carbonate 31, glucose 129, BUN 7,  creatinine 1.1.      Maple Mirza, PA      Doylene Canning. Ladona Ridgel, MD  Electronically Signed    GM/MEDQ  D:  05/02/2007  T:  05/03/2007  Job:  [id number]   cc:   Titus Dubin. Alwyn Ren, MD,FACP,FCCP  Duke Salvia, MD, [facility]  Doylene Canning. Ladona Ridgel, MD

## 2010-08-17 NOTE — Assessment & Plan Note (Signed)
[facility]                         ELECTROPHYSIOLOGY OFFICE NOTE   ELANY, FELIX                        MRN:          [id number]  DATE:03/18/2008                            DOB:          Feb 27, 1941    Ms. Plass returns for followup.  She is a very pleasant woman with  hypertension, dementia, sinus bradycardia, and status post pacemaker  insertion.  She returns today for followup.  In the interim, she has  been stable, though she continues to have fairly advanced dementia and  has very little memory for anything more than day-to-day activities.  She denies chest pain or shortness breath.  Her family who is with her  today.  This note, she does not have much in the way of any complaints.   MEDICATIONS:  1. Potassium 20 twice a day.  2. Diovan 160 a day.  3. Aricept 10 a day.  4. Coumadin as directed.  5. Lantus insulin.  6. Pravachol 40 a day.  7. Namenda 10 a day.   PHYSICAL EXAMINATION:  GENERAL:  She is a pleasant middle-aged woman in  no acute distress.  She has tardive dyskinesia.  VITAL SIGNS:  Weight is 200 pounds, blood pressure 146/88, pulse 71 and  regular, and respirations are 18.  NECK:  No jugular venous distention.  LUNGS:  Clear bilaterally to auscultation.  No wheezes, rales, or  rhonchi are present.  There is no increased work of breathing.  CARDIOVASCULAR:  Regular rate and rhythm.  Normal S1 and S2.  ABDOMEN:  Soft and nontender.  EXTREMITIES:  No edema.   Interrogation of pacemaker demonstrates a St. Jude Zephyr implanted in  January 2009.  Her P-waves were 1.5.  Her R-waves were 5.  The impedance  529 in the A and 518 in the V.  The threshold was 1 volt of 0.4 in the  atrium, 1.875 at 1 volt in the right ventricle.  She was in V pacing  less than 1% of the time.  Battery voltage was 2.8 volts.  She was  underlying sinus rhythm with 67 beats per minute.   IMPRESSION:  1. Symptomatic bradycardia.  2. Status post  pacemaker insertion.  3. Hypertension, well controlled.   DISCUSSION:  Ms. Braaten is stable.  Her pacemaker is working normally.  Her blood pressure is well controlled.  We will see the patient back in  the office for pacemaker followup in 1 year.     Doylene Canning. Ladona Ridgel, MD  Electronically Signed    GWT/MedQ  DD: 03/18/2008  DT: 03/18/2008  Job #: [id number]

## 2010-08-19 ENCOUNTER — Emergency Department (HOSPITAL_COMMUNITY): Payer: Medicare Other

## 2010-08-19 ENCOUNTER — Telehealth: Payer: Self-pay | Admitting: Internal Medicine

## 2010-08-19 ENCOUNTER — Emergency Department (HOSPITAL_COMMUNITY)
Admission: EM | Admit: 2010-08-19 | Discharge: 2010-08-19 | Disposition: A | Discharge status: Home / Self Care | Payer: Medicare Other | Attending: Emergency Medicine | Admitting: Emergency Medicine

## 2010-08-19 DIAGNOSIS — I10 Essential (primary) hypertension: Secondary | ICD-10-CM | POA: Insufficient documentation

## 2010-08-19 DIAGNOSIS — F0280 Dementia in other diseases classified elsewhere without behavioral disturbance: Secondary | ICD-10-CM | POA: Insufficient documentation

## 2010-08-19 DIAGNOSIS — E7800 Pure hypercholesterolemia, unspecified: Secondary | ICD-10-CM | POA: Insufficient documentation

## 2010-08-19 DIAGNOSIS — Z79899 Other long term (current) drug therapy: Secondary | ICD-10-CM | POA: Insufficient documentation

## 2010-08-19 DIAGNOSIS — G309 Alzheimer's disease, unspecified: Secondary | ICD-10-CM | POA: Insufficient documentation

## 2010-08-19 DIAGNOSIS — F068 Other specified mental disorders due to known physiological condition: Secondary | ICD-10-CM | POA: Insufficient documentation

## 2010-08-19 LAB — URINALYSIS, ROUTINE W REFLEX MICROSCOPIC
Bilirubin Urine: NEGATIVE
Ketones, ur: NEGATIVE mg/dL
Nitrite: NEGATIVE
Protein, ur: NEGATIVE mg/dL
Urobilinogen, UA: 0.2 mg/dL (ref 0.0–1.0)

## 2010-08-19 LAB — DIFFERENTIAL
Basophils Absolute: 0.1 10*3/uL (ref 0.0–0.1)
Basophils Relative: 1 % (ref 0–1)
Lymphocytes Relative: 19 % (ref 12–46)
Neutro Abs: 4.5 10*3/uL (ref 1.7–7.7)
Neutrophils Relative %: 69 % (ref 43–77)

## 2010-08-19 LAB — BASIC METABOLIC PANEL
Calcium: 9.3 mg/dL (ref 8.4–10.5)
Chloride: 104 mEq/L (ref 96–112)
GFR calc Af Amer: >60 mL/min (ref >60)
GFR calc non Af Amer: 51 mL/min — ABNORMAL LOW (ref >60)
Potassium: 4.1 mEq/L (ref 3.5–5.1)

## 2010-08-19 LAB — CBC
HCT: 36 % (ref 36.0–46.0)
Hemoglobin: 11.6 g/dL — ABNORMAL LOW (ref 12.0–15.0)
RBC: 4.18 MIL/uL (ref 3.87–5.11)
WBC: 6.6 10*3/uL (ref 4.0–10.5)

## 2010-08-19 NOTE — Telephone Encounter (Signed)
[name] called to report that patient was having trouble walking, cant stand up--[name] is worried about another blood clot--called [name]---she said to tell them to take her to ER---advised [name]---she said she may have to call ambulance because she is worried about getting patient down three steps

## 2010-08-19 NOTE — Telephone Encounter (Signed)
Noted  

## 2010-08-20 LAB — URINE CULTURE: Culture  Setup Time: 201205171252

## 2010-08-20 NOTE — Op Note (Signed)
NAME:  BEATRIS, BELEN                           ACCOUNT NO.:  [id number]   MEDICAL RECORD NO.:  [id number]                   PATIENT TYPE:  INP   LOCATION:  2926                                 FACILITY:  [facility]   PHYSICIAN:  Duke Salvia, M.D. [facility]           DATE OF BIRTH:  [birth year]   DATE OF PROCEDURE:  01/15/2002  DATE OF DISCHARGE:                                 OPERATIVE REPORT   PREOPERATIVE DIAGNOSIS:  Syncope with complete heart block and bradycardia.   POSTOPERATIVE DIAGNOSIS:  Syncope with complete heart block and bradycardia.   PROCEDURE:  Dual-chamber pacemaker implantation.   Following the obtaining informed consent, the patient was brought to the  cardiac catheterization laboratory and placed on the fluoroscopic table in  the supine position.  After routine prep and drape of the left upper chest,  lidocaine was infiltrated along the prepectoral and subclavicular region.  An incision was made and carried down to the layer of the prepectoral fascia  using sharp dissection and electrocautery.  A pocket was formed similarly.  Hemostasis was obtained.   Thereafter attention was turned to gaining access to the extrathoracic  subclavian vein which was accomplished with modest difficulty but without  the aspiration of air or puncture to the artery.  A single guide wire was  ultimately placed and through a 7-French sheath, a second guide wire was  placed.  A 0 silk figure-of-eight suture was placed for hemostasis and then  a 7-French sheath was replaced sequentially allowing for the passage of a  Pacesetter 1488-TC active fixation 52-cm ventricular lead, serial #[id number]  and a 1488-TC 46-cm active fixation atrial lead, serial #[id number].  VENTRICULAR LEAD WAS MARKED WITH A TIE.   Under fluoroscopic guidance, these were manipulated in the right ventricular  apex and the right atrial appendage respectively where the bipolar R-wave  was 10.4 mv with a pacing impedence of 740  ohms and a pacing threshold of  0.5 msec and 0.6 volts of currents and threshold of 1.1 ma and there was no  diaphragmatic pacing to 10 volts.   The bipolar P-wave was 4.2 mv and pacing impendence of 510 ohms.  The pacing  threshold was 0.5 msec at 0.7 volts and a current threshold of 1.3 ma.  With  these acceptable parameters recorded, the leads were secured to the  prepectoral fascia and a hemostatic suture was secured.  The leads were then  attached to a St. Jude Identity DR pulse generator, serial [id number].  Ventricular pacing and the AV pacing were identified.  The pocket was  copiously irrigated with antibiotic containing saline solution.  Hemostasis  was assured.  The leads in the pulse generator were then placed in the  pocket and secured to the prepectoral fascia.  The wound was closed with a 2-  0 and 3-0 Vicryl and the superficial layer was closed with Dermabond skin  adhesive.  Needle count, sponge count and instrument counts were correct at the end of  the procedure according to the staff.   COMPLICATIONS:  None apparent.                                               Duke Salvia, M.D. [facility]    SCK/MEDQ  D:  01/15/2002  T:  01/16/2002  Job:  [id number]   cc:   Titus Dubin. Alwyn Ren, M.D. [facility]   [facility]   Electrophysiologic Laboratory

## 2010-08-20 NOTE — Consult Note (Signed)
[name], [name]                           ACCOUNT NO.:  [id number]   MEDICAL RECORD NO.:  [id number]                   PATIENT TYPE:  INP   LOCATION:  2926                                 FACILITY:  [facility]   PHYSICIAN:  Doylene Canning. Ladona Ridgel, M.D. [facility]           DATE OF BIRTH:  10-31-1940   DATE OF CONSULTATION:  01/14/2002  DATE OF DISCHARGE:                                   CONSULTATION   HISTORY OF PRESENT ILLNESS:  [name] is admitted to the hospital with  syncope and documented long pauses.  The patient is a very pleasant 70-year-  old woman with a history of dementia of unclear etiology.  She also has a  history of diabetes and hypertension.  Apparently her dementia has been  present for some time.  She was admitted to the hospital after experiencing  a fall.  The paramedics were called and on telemetry monitoring, the patient  had long positive pauses of over 10 seconds.  Transcutaneous pacing was  placed, and she was admitted for additional evaluation.  The patient is not  a very good historian, and her family is not available at the time of my  visit.  She denies any previous history of syncope but states that she was  admitted because she fell.  She denies chest pain or shortness of breath  and denies any other particular symptoms.   MEDICATIONS ON ADMISSION:  1. Verapamil, dose of which is unknown.  2. Metformin.  3. Potassium.  4. Ranitidine.  5. Glipizide.   FAMILY HISTORY:  Noncontributory.   SOCIAL HISTORY:  The patient lives with her husband in [place].  She  is retired.  She denies tobacco or ethanol abuse.   REVIEW OF SYSTEMS:  Negative except for the history of syncope.   PHYSICAL EXAMINATION:  GENERAL:  She is an obese, middle-aged woman in no  distress.  VITAL SIGNS:  Blood pressure 100/38, the pulse 63 and regular.  HEENT:  Normocephalic, atraumatic.  Pupils equal, round.  Oropharynx is  moist.  NECK:  I can feel no jugular venous distension.   The carotids are 2+ and  symmetric.  CARDIOVASCULAR:  Regular rate and rhythm with normal S1 and S2.  There were  no murmurs, rubs, or gallops.  LUNGS:  Clear bilaterally with no wheezes, rales, or rhonchi.  ABDOMEN:  Soft, nontender, nondistended.  There is no organomegaly.  EXTREMITIES:  No cyanosis, clubbing, or edema.  NEUROLOGIC:  Alert and oriented x 1.  She knew she was in the hospital but  not which one, and she thought the year was 2003.  She knows her name.   LABORATORY DATA:  EKG initially demonstrates what appeared to be sinus  bradycardia with occasional PAC's.  Prior ECG's appear to demonstrate atrial  fibrillation versus irregular junctional rhythm, but the T-waves were quite  fine on telemetry strips.   IMPRESSION:  1. Syncope.  2. Prolonged sinus pauses.  3. Probable atrial fibrillation.  4. Diabetes.  5. Tooth fracture secondary to her fall.   DISCUSSION:  [name] at the moment is stable but needs permanent  pacemaker insertion.  I suppose there is a chance that she could have  somehow gotten more verapamil other than a low dose.  However, I think  proceeding with pacemaker is warranted at the present time.                                               Doylene Canning. Ladona Ridgel, M.D. [facility]    GWT/MEDQ  D:  01/14/2002  T:  01/16/2002  Job:  [id number]   cc:   Titus Dubin. Alwyn Ren, M.D. [facility]

## 2010-08-20 NOTE — Discharge Summary (Signed)
NAME:  [name], [name]                           ACCOUNT NO.:  [id number]   MEDICAL RECORD NO.:  [id number]                   PATIENT TYPE:  INP   LOCATION:  3001                                 FACILITY:  [facility]   PHYSICIAN:  Titus Dubin. Alwyn Ren, M.D. [facility]         DATE OF BIRTH:  1940-07-06   DATE OF ADMISSION:  06/26/2002  DATE OF DISCHARGE:  07/03/2002                                 DISCHARGE SUMMARY   ADMITTING DIAGNOSES:  1. Abdominal pain.  2. Dementia.  3. Hypertension.  4. Diabetes.   DISCHARGE DIAGNOSES:  1. Abdominal pain, resolved; splenic and renal infarcts.  2. Mental status change, acute on chronic.  3. Diabetes, controlled.  4. Transient ischemic attack, resolved.   BRIEF HISTORY:  The patient is an unfortunate 70 year old African American  female who presented with a one-day history of ill-defined mid-abdominal  pain associated with nausea and vomiting.  She had had anorexia for the  previous 24 hours.  After placement of an NG tube for instillation of  contrast material, she vomited with a questionable hematemesis.  The rectal  exam performed by the ER physician assistant was Hemoccult-negative.   HOSPITAL COURSE:  She was admitted with IV fluids with pain control.  She  did exhibit a temperature max of 101.4 after admission.  Liver function  tests, amylase and lipase were normal.  The ultrasound was unremarkable, but  CT was compatible with splenic infarcts.  These were reviewed with the  radiologist.  There was no evidence of clots in the aorta.  A 2-D  echocardiogram revealed no valvular etiology of clots.  Left ventricular  function was normal.  Mild-to-moderate aortic regurgitation was noted with  mild mitral valve regurgitation with mild dilation of the left atrium and  mild-to-moderate tricuspid valvular regurgitation.   Initially, anticoagulation was held because of question of hematemesis.  On  June 30, 2002, she exhibit a TIA phenomenon with facial  asymmetry, drooling  and autistic behavior.  CAT scan revealed severe atrophy with no acute  intracranial abnormality.  She was placed on heparin.   Additional studies included coagulopathy studies.  Her D-dimer was elevated  at 1.74, with normal less than 0.48, and protein S was 171, with normal less  than 146.  Her protein C was normal, as was the ANA.  She did exhibit  anemia, but it was stable, with no evidence of bleed.  Her white count was  never elevated.  Sed rate was elevated at 70, which was attributed a  possible coagulopathy.  Additionally, antithrombin III was normal at 91.   The day prior to discharge, the patient became agitated, pulling out her  heparin IV line twice, wanting to go home.  She quieted without medication  or sedation.   The husband was anxious to take her home.  I attempted to explain to him the  dilemma we faced.  I was not anxious to  anticoagulate her because of her  dementia and the increased risk of coumadinization with this, but with  splenic and renal infarcts and the TIA, I felt we had no option.   Her insulin is managed by her son and diabetic control has been good prior  to admission this time.   She will be discharged on her home medications which include Prilosec and a  stool softener.  Her prothrombin time will be checked in 48 hours, on  Friday, July 05, 2002.   DISCHARGE STATUS:  Discharge status is improved in reference to abdominal  pain, but prognosis is poor in view of the multisystem disease, which  includes dementia, possibly multi-infarct.   DIET:  Diet will be no concentrated sweets, modified diabetic.                                               Titus Dubin. Alwyn Ren, M.D. [facility]    WFH/MEDQ  D:  07/03/2002  T:  07/03/2002  Job:  [id number]

## 2010-08-20 NOTE — H&P (Signed)
[name], [name]                           ACCOUNT NO.:  [id number]   MEDICAL RECORD NO.:  [id number]                   PATIENT TYPE:  INP   LOCATION:  4709                                 FACILITY:  [facility]   PHYSICIAN:  Titus Dubin. Alwyn Ren, M.D. [facility]         DATE OF BIRTH:  1940-11-06   DATE OF ADMISSION:  05/31/2002  DATE OF DISCHARGE:                                HISTORY & PHYSICAL   HISTORY OF PRESENT ILLNESS:  The patient is a 70 year old African American  female who is admitted with mental status changes superimposed on advanced  Alzheimer's with physical findings suggesting possible cerebrovascular  accident.  Additionally, she is found to have uncontrolled diabetes.   The history comes from her husband, who is not a sophisticated observer or  historian.  He describes her talking out of her head.  Apparently she had  been stuttering for a few days, but the frank confusion was most dramatic  the morning of the office visit.  Additionally he noticed that she had  decreased use of her right upper extremity.   He describes anorexia for a long time.  When he is pressed, he feels that  it has been approximately one to two months.  She has had no abdominal pain,  nausea, or vomiting.  He describes her as hypersomnolent, sleeping for half  a day or more.   Along with the disuse of the right upper extremity this morning, she was  unable to dress herself.   PAST MEDICAL HISTORY:  Admission in October of 2003 for syncope with  bradycardia prompting the insertion of a pacemaker.  Other past history  includes nine pregnancies and eight deliveries.  Medical problems include  anemia, Alzheimer's, and intermittent hypokalemia.   FAMILY HISTORY:  Positive for thyroid disease in her mother, psychiatric  disease in a maternal uncle, diabetes in a nephew, and Alzheimer's in a  sister and brother.   SOCIAL HISTORY:  She has never smoked and does not drink.   MEDICATIONS:  She is on:  1. Seroquel 25 mg twice a day.  2. Aricept 5 mg daily.  3. Stool softener 100 mg daily.  4. Iron 65 mg daily.  5. Ranitidine 150 mg twice a day.  6. K-Dur 20 mEq twice a day.  7. Verapamil 120 mg daily.  8. Glipizide 5 mg daily.  9. Metformin 500 mg daily.   The above frequencies are listed on the label.  For unspecified reasons, the  husband has been giving her the medicines only once a day.  He is not  monitoring her glucose as he cannot work with the [employer].   ALLERGIES:  She has no known drug allergies.   REVIEW OF SYSTEMS:  Increased belching.  She has chronic constipation.   PHYSICAL EXAMINATION:  GENERAL APPEARANCE:  She is frankly confused and  unable to give any meaningful history.  WEIGHT:  Down 3 pounds  to 183 pounds.  VITAL SIGNS:  The respiratory rate was 20, but there was no increased work  of breathing.  The blood pressure was 130/74.  The pulse was 64 and regular.  HEENT:  The fundi could not be examined and she could not cooperate with the  exam.  There is dramatic pyorrhea and plaque formation, particularly over  the lower teeth.  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  Facial asymmetry is suggested with drooping on the  right.  There was no scleral jaundice or icterus.  HEART:  A S4 is noted.  I cannot appreciate carotid bruits.  ABDOMEN:  The abdominal exam was suboptimal.  She could not get up on the  table.  Her husband and I tried to direct her on the table, but she could  not understand what we wished and was becoming agitated.  There was no  apparent organomegaly.  LYMPHATICS:  She had no lymphadenopathy about the head, neck, groin, or  axilla.  EXTREMITIES:  The right upper extremity is essentially held limply at the  side, although she can raise it approximately halfway up.  NEUROLOGIC:  Her speech is rambling and nonsensical.  She can follow no  commands.  She seemed to have a shuffling-type gait, particularly in  the  right lower extremity.  SKIN:  Warm and dry.   LABORATORY DATA:  O2 saturations were 97% on room air.  A random sugar was  over 400.  No odor or ketones.   IMPRESSION:  1. She is admitted because of mental status changes, acute on chronic, with     advanced Alzheimer's.  2. She has uncontrolled diabetes.  3. The right-sided neuromuscular findings suggest possible cerebrovascular     accident.   PLAN:  She will be admitted with sliding scale coverage.  CT scan will be  performed.  Neuropsychiatric evaluation will probably be necessary.  Drug  assessment would be the primary objective of such consultation.  The  symptomatology she exhibits does not appear to be iatrogenic, especially in  view of the fact that her husband is not administering the Seroquel twice a  day, but only once.   A social service consult will be necessary as well because of the lack of  diabetic monitor at home.  It is not likely that the husband will be able to  improve his monitoring of his wife's diabetes.  In the past, there has been  one of the adult daughters who has been motivated and seems to be able to  accomplish such.  This can be pursued at the time of discharge in respect to  getting her involved in dietary and glucose monitoring.                                               Titus Dubin. Alwyn Ren, M.D. [facility]    WFH/MEDQ  D:  06/01/2002  T:  06/01/2002  Job:  [id number]

## 2010-08-20 NOTE — Discharge Summary (Signed)
[facility]  Patient:    [name], [name] Visit Number: [id number] MRN: [id number]          Service Type: MED Location: 3W 0345 02 Attending Physician:  Edmund Hilda Dictated by:   Claretta Fraise, M.D. Admit Date:  12/09/2000 Disc. Date: 12/14/00   CC:         Titus Dubin. Alwyn Ren, M.D. at [facility]                           Discharge Summary  DISCHARGE DIAGNOSES: 1. New onset diabetes mellitus. 2. History of hypertension. 3. History of Alzheimers dementia.  DISCHARGE MEDICATIONS: 1. Aricept 10 mg one p.o. q.d. 2. Verapamil SR 120 mg p.o. q.d. 3. Amaryl 4 mg one p.o. q.d. 4. I have not started her on a baby aspirin. This can be done maybe as an    outpatient since she is a new onset diabetic.  DISCHARGE ACTIVITY:  Activity as tolerated.  DISCHARGE DIET:  Diabetic diet of about 1800 calories.  DISCHARGE FOLLOWUP:   The patient has an appointment with the outpatient diabetes center on September 23 at 9:30 a.m. with Natalia Leatherwood. They have been given the phone number of [phone] to contact [facility]. She is also to follow up with Dr. Alwyn Ren at the [facility] on Monday, December 18, 2000 at 11:15 a.m. This appointment has also been set up. The patient has been given the phone number for the clinic to call to get directions to the clinic.  HOSPITAL COURSE:  This 70 year old black female, patient of Dr. Caryl Never, comes in with complaints of nausea and vomiting, and it was found during that time that her blood sugar was 487 with a potassium of 2.8 and a serum sodium of 126. She was initially started on insulin protocol and subsequently switched to Amaryl because of the family situation and with the current Amaryl dose of 4 mg once a day, her sugars have been markedly better. They have been running anywhere from 106 to 196 and prior to that, was running in the upper 200 range. The  diabetes educator had come to the patients room with one of the children being present and had set up an appointment with her for outpatient followup. The patient has done well during the hospital stay. She has taken p.o. intake adequately without any problems.  In regards to her hypertension, she was continued on her Verapamil dose. Her blood pressure at the time of discharge was 139/90, occasionally she has dipped into 111/69. I did not start her on an ACE inhibitor in light of her current diabetes, but this may be a consideration if her blood pressure poses a problem and Dr. Alwyn Ren can certainly make a decision on that.  On her initial admission her, a chest x-ray was obtained and also abdominal series were obtained. The abdominal series was fairly unremarkable but on the chest x-ray there was a mention of a vague rounded density in the right upper lobe which is near the end of the first rib and the thought is that this could be part of the rib. However, they did not have any old films to compare with, and therefore, the recommendation is for a repeat chest x-ray in six to eight weeks.  ADMISSION LABORATORY DATA:  Her BMP showed a sodium of 126, potassium of 2.8, chloride of 85, CO2 of 32, glucose of 487, BUN  and creatinine was 7 and 0.9, calcium of 9. Her LFTs showed a total protein of 8.1, albumin of 3.8. Her lipase was 39. Her white count was 5.6 with a H&H of 12.4 and 36, respectively, with a platelet count of 242,000. Her UA showed 500 of glucose, trace ketones, negative for nitrites and leukocytes.  For her hypokalemia, she was given potassium replacement and her last set up electrolytes done on September 10 showed her sodium to be 142, potassium 4.2, chloride of 111, BUN and creatinine were less than 5 and 0.9. Her glucose at that time was 245, keeping in mind that that was the day she was just started on Amaryl.  The patient is being discharged to home in stable condition.  I have arranged for home health to come in and do a medical compliance visit. Once diabetes education is done, it may be possible that fingersticks can be checked at home, although this may be difficult based on the fact that the home situation apparently is not the best in regards to the fact that her husband apparently is illiterate. It was for that reason that insulin was not done and she looks like as though Amaryl is keeping things under control. We do not have a hemoglobin A1c that was done on her during this hospital stay and this can be done as an outpatient. Dictated by:   Claretta Fraise, M.D. Attending Physician:  Edmund Hilda DD:  12/14/00 TD:  12/14/00 Job: [phone]

## 2010-08-20 NOTE — Consult Note (Signed)
NAME:  [name], [name]                           ACCOUNT NO.:  [id number]   MEDICAL RECORD NO.:  [id number]                   PATIENT TYPE:  INP   LOCATION:  2025                                 FACILITY:  [facility]   PHYSICIAN:  Gustavus Messing. Orlin Hilding, M.D.          DATE OF BIRTH:  [birth year]   DATE OF CONSULTATION:  01/17/2002  DATE OF DISCHARGE:                                   CONSULTATION   REASON FOR CONSULTATION:  Dementia.   HISTORY OF PRESENT ILLNESS:  The patient is a 70 year old woman with  longstanding Alzheimer's disease with early onset and who was admitted four  days ago because of syncope felt due to bradycardia; she is now status post  pacemaker.  She also has a history of dementia and there is a question of  whether she should be on medication.  She was previously followed by Dr.  Tomasa Rand on Aricept 10 mg.  She was last seen by our office, February 09, 2000, and supposed to follow up annually but was lost to followup for the  last two years.  At the last visit, she was taking Aricept 10 mg a day and  was stable.  Her husband had complained that she was belching during the  night and was not sure if that was due to the Aricept, but I had recommended  her taking the Aricept earlier in the day with a large meal and at this  point, the patient is no longer on Aricept; I do not know whether the family  and the patient discontinued it themselves or primary physician discontinued  it; the husband is unable to tell me, the patient obviously cannot tell me  and it is unclear why this was discontinued, since they were lost to  followup.  I cannot get any clear idea what she is able to do.  The husband  says she can bathe herself but she cannot drive, cook, dress or do any kind  of normal self-care activities; it sounds as though she has been  significantly impaired.   REVIEW OF SYSTEMS:  She is complaining of some itching.  She had some falls  and loss of consciousness before  her admission to the hospital for the  bradycardia, syncope and pacemaker placement.   PAST MEDICAL HISTORY:  1. Past medical history is significant for dementia or early-onset     Alzheimer's.  She had an abnormal CT showing atrophy, abnormal EEG     showing some slowing in FIRDA, abnormal spinal fluid with TAO protein and     A beta 40 ___ peptides consistent with Alzheimer's disease.  She was     supposed to be taking Aricept 10 mg a day but was lost to followup and is     currently not on the medication.  2. Bradycardia with syncope, status post pacer.  3. Hypertension.  4. Diabetes.  5. Atrial fibrillation.  MEDICATIONS:  Pepcid, insulin, potassium, iron, Colace, Ativan, Haldol,  glipizide, verapamil and magnesium.   ALLERGIES:  No known drug allergies.   SOCIAL HISTORY:  She lives at home with her husband and children.  No  alcohol use or cigarette use as best I can tell.   FAMILY HISTORY:  Family history is positive for stroke, heart disease,  hypertension and diabetes.   PHYSICAL EXAMINATION:  VITAL SIGNS:  Temperature is 97.5, pulse is 76, blood  pressure 160/90, respirations 20.  HEENT:  Head is normocephalic and atraumatic.  NECK:  Neck is supple without bruits.  HEART:  Regular rate and rhythm.  LUNGS:  Lungs are clear to auscultation.  EXTREMITIES:  Extremities without edema.  NEUROLOGIC:  Mental status:  She is awake, alert, somewhat restless.  She is  not oriented to the hospital or medical facility in any way.  She does not  know who I am or who any of the people around her are.  She thinks she is in  [employer], which is where she lives; she knows that is in [facility]; she does not know what state that is in; she knows she is in the  [place].  She does not know who the president, vice president or  governor are.  She does not know the year, season, month, date, day or time.  She can register three-out-of-three items but cannot recall any of  them.  She was able to spell world forward and backwards, actually got dlro and  remanded a d.  She can name objects and repeat a sentence.  She cannot  follow complex verbal command.  I did not try her on writing tasks.  This  score is about 9 out of 27, which is fairly advanced.  Cranial nerves II:  Pupils are equal and reactive.  Visual fields appear to be full.  She also  blinks to threat.  Extraocular movements are intact without nystagmus,  ophthalmoparesis or ptosis.  Facial sensation appears to be normal.  Facial  motor activity is normal.  Hearing is intact.  There is no dysarthria.  Palate appears to be symmetric.  Tongue is midline.  On motor exam, she  seems to have a normal motor exam.  She is walking around the room and seems  to use 5/5 strength; she is not fully cooperative but the best I can tell,  there is normal strength.  She will not cooperate with satelliting, drift or  rapid fine movement testing.  Reflexes are 1+ with downgoing toes.  Coordination:  Finger-to-nose and heel-to-shin are intact.  Gait is normal.  Sensory exam is grossly normal.   IMPRESSION:  Advanced Alzheimer's with a score of 9 out of 27 on the Mini-  Mental Status Exam.   RECOMMENDATIONS:  It is unclear why she stopped the Aricept.  It is still  the simplest to start and titrate, beginning at 5 mg a day for six weeks and  then titrating to 10 mg a day and with her, I would use it in the morning  with breakfast.  Reminyl and Exelon are twice a day and require three to six  titration adjustments.  Reminyl can either be done 4 mg b.i.d. for several  weeks, then 8 mg b.i.d. for several weeks, then 12 mg b.i.d., or if it  causes GI upset, 4 mg b.i.d. for several weeks, then 4 in the morning and 8  at night for several weeks, then 8 b.i.d.  for several weeks, then 8 in the  morning, 12 at night for several weeks, then 12 b.i.d., which is complicated and burdensome.  Exelon is even worse, 1.5 mg b.i.d.  to 3.0 mg b.i.d. to 4.5  mg b.i.d. to 6.0 mg b.i.d. or half-step titrations which may take eight  steps.  Reminyl and Exelon can both cause bradycardia among other cardiac  problems, but with the pacemaker, that is not really an issue.  All three of  them can cause some GI upset.  I would stick with the Aricept.  I would also  try Seroquel for the agitation at 25 mg b.i.d. or if that is not tolerated,  Zyprexa 2.5 mg b.i.d.                                                Catherine A. Orlin Hilding, M.D.    CAW/MEDQ  D:  01/17/2002  T:  01/19/2002  Job:  [id number]
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
## 2010-08-20 NOTE — H&P (Signed)
NAME:  [name], [name]                           ACCOUNT NO.:  [id number]   MEDICAL RECORD NO.:  [id number]                   PATIENT TYPE:  INP   LOCATION:  1830                                 FACILITY:  [facility]   PHYSICIAN:  Learta Codding, M.D. [facility]             DATE OF BIRTH:  [birth year]   DATE OF ADMISSION:  01/13/2002  DATE OF DISCHARGE:                                HISTORY & PHYSICAL   CHIEF COMPLAINT:  Syncope.   HISTORY OF PRESENT ILLNESS:  The patient is a 70 year old female with a  history of diabetes mellitus, who was followed by Titus Dubin. Alwyn Ren, M.D.  The patient has no known cardiac history, but no old records are available.  The patient's history is also difficult due to the fact that she has  dementia.  Her husband is with her in the emergency room and is able to  provide most of the history.  Reportedly the patient was not feeling very  well today and felt clammy.  Reportedly the husband was sitting next to the  patient when he suddenly saw that she fell forward on her face.  In the  process, the patient broke two front teeth.  She reportedly blacked out for  a brief period of time, but as soon as her husband attended to her, she  appeared to be awake.  EMS was called and on arrival the patient was in a  junctional rhythm with a heart rate of 38-50, but at other times this was an  irregular rhythm consistent with atrial fibrillation.  Several pauses  lasting 5-15 seconds were also noted.  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  The patient has no prior history of  syncope.  She also denies recent substernal chest pain, shortness of breath,  orthopnea, and PND.  She also reports no palpitations.  On arrival in the  emergency room, the patient was in an accelerated junctional rhythm.  The  underlying rhythm appears to be atrial fibrillation with possible  complete  AV block.   ALLERGIES:  No known drug allergies.   MEDICATIONS:  Doses are unknown.  The patient was reportedly taking  verapamil, metformin, potassium chloride, __________, and glipizide.   FAMILY HISTORY:  Noncontributory.   SOCIAL HISTORY:  The patient lives with her husband.  She is retired.  Her  husband takes care of her due to underlying dementia.  She does not smoke or  drink.   REVIEW OF SYMPTOMS:  As in HPI.  No nausea or vomiting.  No fever or chills.  No orthopnea or PND.  No palpitations.  Positive for syncope with details as  outlined above.   PHYSICAL EXAMINATION:  VITAL SIGNS:  The blood pressure is 137/86.  The  heart rate now is 50 beats per minute, respirations 16, and  saturations 98.  GENERAL APPEARANCE:  A well-nourished African American female in no apparent  distress.  HEENT:  Pupils ____________.  Conjunctivae clear.  NECK:  Supple.  Normal carotid upstroke.  No carotid bruits.  LUNGS:  Clear.  HEART:  Regular rate and rhythm.  Normal S1 and S2.  No significant murmurs.  ABDOMEN:  Soft and nontender.  No rebound or guarding.  Good bowel sounds.  EXTREMITIES:  2+ peripheral pulses.  No cyanosis, clubbing, or edema.   LABORATORY DATA:  The 12-lead electrocardiogram demonstrated atrial  fibrillation with a heart rate of 50 beats per minute.  One EKG, however,  does show atrial fibrillation with complete AV block, right bundle branch  block, and left anterior hemiblock.   The white count is 8.5, hemoglobin 11, hematocrit 35, and platelet count  302.  The calcium was 8.6.  Digoxin was 10.2.  Magnesium was 1.3 and  phosphorus 2.8.  The prothrombin time was 13.8.  The potassium was 2.7.  The  troponin was 0.01.   The chest x-ray revealed no acute abnormalities.   IMPRESSION AND PLAN:  1. Syncope.  This appears to be secondary to significant conduction system     disease.  The patient does have evidence of atrial fibrillation with slow      ventricular response, but also with evidence of complete AV block and     junctional rhythm.  She also has prolonged pauses with no ventricular     escape on approximately 5-15 seconds.  The patient will require a     permanent pacemaker and she will be kept NPO.  Although she is on     verapamil at an unknown dose, it is unlikely to alter the decision     regarding pacemaker implantation, particularly in light of her     significant conduction system disease, which is infranodal.  2. Atrial fibrillation.  The decision will need to made whether the patient     needs Coumadin post pacemaker implantation.  3. Diabetes mellitus.  The patient is not hypoglycemic and this not     contributing to her syncope.  She will continue on her current     medications.  She will also be placed on fingerstick blood sugars with     sliding scale insulin.  4. Hypokalemia and hypomagnesemia.  Magnesium and potassium chloride will be     administered.   DISPOSITION:  The patient will be monitored in the CCU.  She is currently  hemodynamically stable and external pacemaker pads have been applied.  Will  anticipate an elective pacemaker implantation in the morning.                                               Learta Codding, M.D. [facility]    GED/MEDQ  D:  01/13/2002  T:  01/13/2002  Job:  [id number]   cc:   Titus Dubin. Alwyn Ren, M.D. [facility]

## 2010-08-20 NOTE — Discharge Summary (Signed)
NAME:  [name], [name]                           ACCOUNT NO.:  [id number]   MEDICAL RECORD NO.:  [id number]                   [name] TYPE:  INP   LOCATION:  4709                                 FACILITY:  [facility]   PHYSICIAN:  Rene Paci, M.D. [facility]          DATE OF BIRTH:  November 10, 1940   DATE OF ADMISSION:  05/31/2002  DATE OF DISCHARGE:  06/05/2002                                 DISCHARGE SUMMARY   DISCHARGE DIAGNOSES:  1. Mental status changes.  2. Decreased level of consciousness.  3. Progressive Alzheimer's dementia.  4. Hyperglycemia.  5. Hypertension.   HISTORY OF PRESENT ILLNESS:  [name] is a 70 year old African American  female who presented with mental status changes in Kristy setting of  Alzheimer's dementia.  She was admitted to rule out CVA.  Additionally, she  was noted to have uncontrolled diabetes.   PAST MEDICAL HISTORY:  1. History of syncope in October of 2003 with bradycardia, status post     pacemaker.  2. Anemia.  3. Alzheimer's dementia.  4. Intermittent hypokalemia.  5. Adult onset diabetes mellitus.   HOSPITAL COURSE:  #1 - MENTAL STATUS CHANGES:  A head CT was obtained to  rule out acute neurologic event.  Head CT was negative for a CVA.  We also  asked for psychiatry to see [name].  It became more apparent that Kristy  [name] had been on Aricept and Seroquel, but perhaps these medications had  not been given or not been given correctly.  After speaking Kristy husband, he  states that he lets her manage her medical issues herself.  It is clear that  her Alzheimer's has advanced to Kristy point that she is not able to manage her  medications independently.  Antonietta Breach, M.D., followed up with Kristy  [name] on June 03, 2002.  He admitted that she does not have Kristy capacity  for informed consent.  However, Kristy degree of capacity over a 24-hour period  and after this acute episode should be reassessed since there are many acute  factors possibly  contributing to this decline.  He feels that she may have  Kristy potential to have improved capacity for informed consent.  He felt that  she might improve with Aricept and regular adequate care.  Kristy difficulty of  Kristy situation comes from [name]'s husband, who is Kristy primary  caregiver, although he is not accepting this role yet.  He is not  sophisticated in his thinking and has a poor understanding of her medical  processes.  It is evident that he does care deeply for [name], but  perhaps lacks Kristy ability to care for her.  Making Kristy issue worse, Kristy  [name] apparently lives in a home where there is no phone.  [name]'s  husband cannot give any phone numbers for any of his children that might be  able to assist with Kristy care  of [name].  Additionally, Kristy case  managers have been unable to reach or speak with any other family members  that might help with Kristy care of [name].  [name] is also  illiterate and unable to read Kristy discharge instructions that will be given  to [name].  She will need to continue her medications as instructed.  She also needs to be following a diabetic diet and we are not clear if he is  going to be able to do this.  Antonietta Breach, M.D., was not able to commit  to [name] being incompetent and therefore we will plan on discharging  [name] home with her husband and have adult protective services follow  up in Kristy home.   #2 - ADULT ONSET DIABETES MELLITUS:  Again, [name] presented with  uncontrolled diabetes.  This is likely because [name] has been  providing her own medical care.  We are not sure if [name] has been  taking her medications.  We did resume [name]'s home medications and  have in fact increased Kristy dose.   #3 - HYPERTENSION:  Also, this is not well controlled, but for now will  continue her current regimen.   #4 - DISPOSITION:  As discussed in detail previously, case management has   been assisting with discharge plans and has made attempts to speak with  other family members.  At this time, we will plan for home health to visit  [name].  I have also asked for adult protective services to be involved  after discharge, as well as social work as she may need placement in Kristy  future, specifically at a memory care center.   LABORATORY DATA:  Labs at discharge include a potassium of 3.7, otherwise  Kristy BMET was normal.  Hemoglobin 11.9, otherwise Kristy CBC was normal.  Kristy  hemoglobin A1C was elevated at 14.8%.  Kristy TSH was 1.543, normal.  B12 581.  RPR nonreactive.  MRI of Kristy brain showed moderate atrophy, but no acute  infarcts or masses were identified.   DISCHARGE MEDICATIONS:  1. Verapamil 120 mg daily.  2. Aricept 5 mg daily.  3. Seroquel 25 mg q.h.s.  4. Glucotrol 10 mg daily.  5. Glucophage 500 mg two tablets twice daily.  6. Iron as at home.  7. Ranitidine 150 mg b.i.d.  8. Potassium chloride 10 mEq daily.   FOLLOW-UP:  Follow up with Titus Dubin. Alwyn Ren, M.D., in one to two weeks.     Cornell Barman, P.A. [facility]                  Rene Paci, M.D. [facility]    LC/MEDQ  D:  06/05/2002  T:  06/05/2002  Job:  [id number]   cc:   Titus Dubin. Alwyn Ren, M.D. [facility]   Antonietta Breach, M.D.  [address]  [place], [place] [address]  Fax: [phone]

## 2010-08-20 NOTE — Letter (Signed)
May 16, 2006    Bevelyn Buckles. [name] Shearer, M.D.  [address]  [place] [address]   RE:  JANAYSIA, MCLEROY  MRN:  [id number]  /  DOB:  1940-06-10   Dear Dr. [name] Shearer:   Our office received a phone call from [facility] son, Casimiro Needle [phone]), requesting a letter for Medical Power Of [place].  Our  triage nurse discussed the specific questions.  He had requested  documentation from our office stating that she is competent to  decide  her Power Of [address].  I left a message on his cell phone on May 16, 2006 stating that he needed to contact your office as my evaluation  was that she was not mentally capable to make such a decision.Ms. Gemmill  had an office visit scheduled March 01, 2006 at 9 a.m with you to  address her dementia.  As of this date, I do not have your final  evaluation.   Please refer to my note of January 05, 2006, which delineates the family  situation and my concerns.    Sincerely,      Titus Dubin. Alwyn Ren, MD,FACP,FCCP  Electronically Signed    WFH/MedQ  DD: 05/16/2006  DT: 05/16/2006  Job #: [id number]

## 2010-08-20 NOTE — Assessment & Plan Note (Signed)
[facility]                          [place] OFFICE NOTE   TANJI, STORRS                        MRN:          [id number]  DATE:01/06/2006                            DOB:          [birth year]    A phone call was received from [facility] son, Casimiro Needle on January 06, 2006  (phone number [phone]).  The son stated that his mother is not exhibiting  violent behavior and has never been violent.  He stated that he did not want  her on haloperidol and stated that he has discontinued this medicine.  He  stated that he did not want any medical information released to his sister.   Received at this time was a request for restriction on health information on  Ms. [name], signed by Maeola Harman, her husband, allowing release of  information to Georgeanna Lea, Melvenia Needles, [place] and [place], but not  to [facility], the daughter.   The family's communication problem make the care of Ms. Bui extremely  difficult.  Her care was complicated by her severe dementia and  noncompliance in the past.  I will request a formal neurology consult to  assess the risks and options for this unfortunate lady.       Titus Dubin. Alwyn Ren, MD,FACP,FCCP      WFH/MedQ  DD:  01/10/2006  DT:  01/12/2006  Job #:  [id number]

## 2010-08-20 NOTE — Discharge Summary (Signed)
NAME:  [name], [name]                           ACCOUNT NO.:  [id number]   MEDICAL RECORD NO.:  [id number]                   PATIENT TYPE:  INP   LOCATION:  2025                                 FACILITY:  [facility]   PHYSICIAN:  Doylene Canning. Ladona Ridgel, M.D. [facility]           DATE OF BIRTH:  1940-11-15   DATE OF ADMISSION:  01/13/2002  DATE OF DISCHARGE:  01/18/2002                                 DISCHARGE SUMMARY   PRIMARY DIAGNOSIS:  Syncope.   SECONDARY DIAGNOSES:  1. Bradycardia.  2. Diabetes.  3. Alzheimer's disease.  4. Hypertension.   HISTORY OF PRESENT ILLNESS:  This is a 70 year old female with no known past  medical history of cardiac disease with known dementia.  She has not been  feeling well prior to admission.  History is difficult to obtain.  She does  not remember secondary to dementia.  Husband's information is scant.  She  was sitting when she suddenly fell forward, hitting the floor.  She broke  her front two teeth.  She loss of consciousness for a few seconds.  Upon  arrival in the emergency room, her heart rate was 38-50 with several long  pauses up to 15 seconds.  She was noted to be clammy according to EMS.  She  complained of feeling swimmy-headed with activity over the days prior to  admission.   HOSPITAL COURSE:  1. SYNCOPE:  During her hospitalization, the patient had a consult to     electrophysiology.  She was also noted to be hypokalemic.  Her potassium     was replaced.  She was also noted to have hypomagnesemia, and she was     replaced with magnesium as well.  She underwent placement of a [employer]     type dual-chamber pacemaker on January 15, 2002, for complete heart block     and bradycardia.  She tolerated the procedure well and had no immediate     postop complications.  The patient remained confused.  She had a CAT scan     of the head which was negative for bleed.  The patient underwent a     neurologic consult with [facility], who recommended  placing the     patient on Aricept 5 mg daily for six weeks and gradually increasing to     10 mg daily as well as Seroquel for her agitation.  For her syncope, her     pacemaker was functioning.  She remained in normal sinus rhythm.  She     also has a history of atrial fibrillation, although she has no documented     and no atrial fibrillation in the hospital.  The patient would not be a     good candidate for Coumadin therapy.  2. DIABETES:  Blood sugars were well-controlled on glipizide 5 mg daily     only.  Her Glucophage was not restarted but may  need to be restarted at     home secondary to dietary noncompliance.  She had decreased potassium and     decreased magnesium.  She was resumed back on her potassium 20 mEq b.i.d.     that she had been taking at home and magnesium was replaced with Mag-Ox     400 b.i.d.  The patient had received the Mag-Ox 400 b.i.d. for two days.  3. IRON DEFICIENCY ANEMIA:  The patient was placed on iron 325 mg daily as     well as Colace to prevent constipation.  4. HYPERTENSION:  She was continued on her verapamil with adequate control     of her hypertension.  5. ALZHEIMER'S DISEASE:  The patient is to continue on Aricept and Seroquel.     I spoke with Titus Dubin. Alwyn Ren, M.D.'s office.  They will follow up as an     outpatient on January 24, 2002, at 10:30 a.m.  The patient is very     confused.   DISCHARGE MEDICATIONS:  1. Zantac 150 mg b.i.d.  2. K-Dur 20 b.i.d.  3. Glipizide 5 q.d.  4. Verapamil 120 q.d.  5. Aricept 5 mg q.d.  6. Seroquel 25 mg b.i.d.  7. Iron 325 q.d.  8. Colace 100 mg q.d.  She was not to take if she had loose bowel movements.   ACTIVITY AND WOUND CARE:  Per her discharge supplemental pacemaking sheet.   DIET:  She was to be on a low fat, low cholesterol, low salt, diabetic diet.   FOLLOW UP:  1. An appointment was scheduled at the [facility] at [facility], January 31, 2002, at 9:30 a.m. for a wound check.  2.  She was to follow with Doylene Canning. Ladona Ridgel, M.D. in three months, and the     office will call to schedule that appointment.  3. The patient was to follow with Titus Dubin. Alwyn Ren, M.D. on Thursday,     January 24, 2002, at 10:30 a.m.     Chinita Pester, C.R.N.P. [facility]                 Doylene Canning. Ladona Ridgel, M.D. [facility]    DS/MEDQ  D:  01/18/2002  T:  01/20/2002  Job:  [id number]   cc:   Titus Dubin. Alwyn Ren, M.D. [facility]   Doylene Canning. Ladona Ridgel, M.D. [facility]

## 2010-08-20 NOTE — H&P (Signed)
[facility]  Patient:    [name], [name] Visit Number: [id number] MRN: [id number]          Service Type: MED Location: 3W 0345 02 Attending Physician:  Edmund Hilda Dictated by:   Evette Georges, M.D. [facility] Admit Date:  12/09/2000   CC:         Titus Dubin. Alwyn Ren, M.D. [facility]   History and Physical  DATE OF BIRTH:  [birth year]  HISTORY OF PRESENT ILLNESS:  This is the first [facility] admission for this 70 year old married black female, who comes in for the treatment of new onset diabetes.  Her husband is the historian.  The wife has had Alzheimers disease for two years.  She cannot recall details of her immediate history, although she recalls excellent details of past medical history.  The husband states she felt well until three days ago when she began having nausea.  She then began vomiting.  He brought her to the emergency room for evaluation tonight because the nausea and vomiting would not abate.  Basic chemistries were done.  Blood sugar was 487 on admission with potassium 2.8, and serum sodium 126.  She was therefore started on insulin protocol and admitted for correction of metabolic abnormalities and to get blood sugar under control.  Her husband says she has never had any trouble with diabetes in the past.  She has otherwise been in excellent health except for her hypertension and her Alzheimers disease.  PAST MEDICAL HISTORY:  Hospitalized for childbirth x 8.  PAST ILLNESSES:  None.  INJURIES:  None.  DRUG ALLERGIES:  None.  CURRENT MEDICATIONS: 1. _________  q.d. 2. Verapamil 120 q.d.  HABITS:  She does not smoke or drink any alcohol.  REVIEW OF SYSTEMS:  Negative except for diagnosis of Alzheimers disease in 2000 followed by Dr. Tomasa Rand.  He is not sure about her weight.  He had not weighed her recently.  FAMILY HISTORY:  Mother is 81, alive and well.  Father died in his 24s, unknown cause.   She has six brothers, six sisters, all in good health.  SOCIAL HISTORY:  She had to retire from work because of Alzheimers.  She is cared for by her husband.  PHYSICAL EXAMINATION:  VITAL SIGNS:  Temperature 98, pulse 70 and regular, respirations 12 and regular, blood pressure 140/100.  GENERAL:  In general she is an obese black female in no acute distress.  HEENT:  Negative, except she has upper and lower dentures that are in poor condition.  NECK:  Supple.  Thyroid is not enlarged.  No adenopathy.  CHEST:  Clear to auscultation.  CARDIAC:  Negative.  ABDOMEN:  Negative.  EXTREMITIES:  Normal.  Skin normal, peripheral pulses normal.  LABORATORY DATA:  Shows normal lipase of 39.  Serum sodium 126, potassium 2.8, glucose 487.  BUN is 7, creatinine 0.9.  IMPRESSION: 1. New onset diabetes.  Plan:  Admit.  IV fluids, insulin.  We will get    diabetic teaching here for consultation. 2. History of Alzheimers disease.  Continue her _________  q.d. 3. History of hypertension.  Continue Verapamil. Dictated by:   Evette Georges, M.D. [facility] Attending Physician:  Edmund Hilda DD:  12/10/00 TD:  12/10/00 Job: [id number]

## 2010-08-20 NOTE — H&P (Signed)
NAME:  [name], [name]                           ACCOUNT NO.:  [id number]   MEDICAL RECORD NO.:  [id number]                   PATIENT TYPE:  INP   LOCATION:  5531                                 FACILITY:  [facility]   PHYSICIAN:  Wanda Plump, MD [facility]                 DATE OF BIRTH:  [birth year]   DATE OF ADMISSION:  06/25/2002  DATE OF DISCHARGE:                                HISTORY & PHYSICAL   CHIEF COMPLAINT:  Abdominal pain.   HISTORY OF PRESENT ILLNESS:  The patient is a 70 year old black lady who  presented to the emergency room with a 1 day history of ill defined mid  abdominal pain associated with some nausea and vomiting. Her appetite has  been decreased in the last 24 hours and she has not been eating much.  In  the emergency room the evaluation included a nasogastric tube placed in her  stomach to help with the intake of contrast material. She did have an  episode of vomiting with questionable hematemesis. The nasogastric tube has  already been removed. At the time of my evaluation the patient seems to be  better and in no distress.   PAST MEDICAL HISTORY:  1. Dementia.  2. Type 2 diabetes mellitus.  3. History of heart block with syncope status post a pacemaker placement     last year.  4. Hypertension.  5. Episodic hypokalemia.   SOCIAL HISTORY:  1. Denies the use or alcohol or tobacco.  2. Her environment at home is very difficult. Please see the discharge     summary from June 05, 2002, for details. Basically the patient has a     history of dementia and her husband is the primary care giver. Despite     his willingness to help, he is not able to do much for her due to his     poor education and understanding of the patient's illness. During her     last admission she was evaluated by Dr. Jeanie Sewer, who, despite the above     history, was not able to rule the patient as incompetent for her own     care.   FAMILY HISTORY:  Noncontributory at this time.   REVIEW OF  SYSTEMS:  Denies any fever or chills. No diarrhea. The bowel  movements have been daily but her husband describes her as being constipated  because the stools are in very small quantity. No blood per rectum. No chest  pain, shortness of breath or leg swelling. Blood sugars at home running  around 140 over 210 which is in a better range than a few months ago. She  also denies dysuria, hematuria, headache. Her speech has been at baseline  and there is no focal weakness.   MEDICATIONS:  1. Verapamil 120 mg 1 p.o. every day.  2. Aricept 5 mg p.o. every day.  3. Seroquel  25 mg 1 p.o. q.h.s.  4. Glucotrol 10 mg 1 p.o. every day.  5. Glucophage 500 mg 2 p.o. b.i.d.  6. Iron supplement.  7. Ranitidine 150 mg 1 p.o. b.i.d.  8. Potassium chloride 10 mEq 1 p.o. every day.   ALLERGIES:  No known drug allergies.   PHYSICAL EXAMINATION:  GENERAL: At the time of my examination the patient is  sleepy but arousable. Is hard for me to get a detailed history given her  dementia and poor understanding of her illness.  VITAL SIGNS:  In the emergency room, she has been afebrile, blood pressure  initially was 215/135 but now is 169/78, pulse 80, respirations 20, O2  saturation 99%.  LUNGS:  Clear to auscultation bilaterally.  CARDIOVASCULAR:  Regular rate and rhythm without murmur.  ABDOMEN:  Not distended, soft. She has good bowel sounds. There are no  masses or rebound. She does not seem to be tender.  EXTREMITIES:  No edema.  NEUROLOGIC:  Motor is normal. Speech, mental status is basically baseline  from the previous admission.  NECK:  Full range of motion.  HEENT: Extraocular movements intact.  RECTAL:  Digital rectal exam was performed by the ER physician assistant and  it was Hemoccult negative.   LABORATORY DATA:  Urinalysis was normal. White count 9.7, hemoglobin 11.7,  platelets 296. Creatinine 1.0, BUN 5, blood sugar 265, sodium 136, potassium  4.1. Liver function tests normal.   An EKG  showed normal sinus rhythm. A CT scan of the abdomen was reviewed  with the radiologist. The spleen has several lesions consistent with  infarcts. The kidneys also have lesions consistent either with  pyelonephritis or infarcts. In the absence of pyelonephritis picture, this  probably represents infarcts. The duration of these lesions is unknown. I do  not know if these are acute or old lesions. By the way the aorta looks in  the CT scan the aorta showed no dissection and there is no major  obstructions or plaques that we can tell from this CT scan.   ASSESSMENT AND PLAN:  PROBLEM #1, ABDOMINAL PAIN:  There is no acute  clinical picture at present. The differential diagnosis is large including  acute renal or splenic infarct, diverticulitis, gastritis, cholelithiasis,  etc. The lesions seen at the kidney and spleen may be incidental and not  related to the symptoms.   At this point my plan is to admit her for IV hydration. Will schedule an  echocardiogram to rule out a source of possible emboli and also will do an  ultrasound of the abdomen to check for gallbladder disease and to further  evaluate the aorta.   Tonight I am also facing the decision of whether to start anticoagulation  with IV heparin or not. I am a little reluctant to do so because of the  questionable episode of hematemesis here in the ER and I am not certain that  I am dealing with an acute embolic event. Because of these reasons I will go  ahead and withhold IV heparin. Of note is that this patient will be a very  poor candidate for long-term Coumadin therapy.   PROBLEM #2, HYPERTENSION:  Will continue with Verapamil.   PROBLEM #3, DIABETES: The patient will be n.p.o. Will hold oral  hyperglycemic agents and put her on a sliding scale of rapid acting insulin.   PROBLEM #4, DEMENTIA:  This seems to be stable.   PROBLEM #5, HISTORY OF HEART BLOCK.  Wanda Plump, MD  [facility]    JEP/MEDQ  D:  06/26/2002  T:  06/26/2002  Job:  [id number]

## 2010-08-31 ENCOUNTER — Encounter: Payer: Self-pay | Admitting: Internal Medicine

## 2010-08-31 ENCOUNTER — Ambulatory Visit (INDEPENDENT_AMBULATORY_CARE_PROVIDER_SITE_OTHER): Payer: Medicare Other | Admitting: Internal Medicine

## 2010-08-31 DIAGNOSIS — R2681 Unsteadiness on feet: Secondary | ICD-10-CM

## 2010-08-31 DIAGNOSIS — R5381 Other malaise: Secondary | ICD-10-CM

## 2010-08-31 DIAGNOSIS — E119 Type 2 diabetes mellitus without complications: Secondary | ICD-10-CM

## 2010-08-31 DIAGNOSIS — D649 Anemia, unspecified: Secondary | ICD-10-CM

## 2010-08-31 DIAGNOSIS — R5383 Other fatigue: Secondary | ICD-10-CM

## 2010-08-31 DIAGNOSIS — R269 Unspecified abnormalities of gait and mobility: Secondary | ICD-10-CM

## 2010-08-31 NOTE — Progress Notes (Signed)
Subjective:    Patient ID: [name], female    DOB: 1940/05/25, 70 y.o.   MRN: [id number]  HPI FATIGUE & Gait Issues Onset: early May  Fatigue even  @ rest : constant    Primarily physical fatigue: yes Symptoms: Fever/ chills : no  Night sweats: yes                                                                                     Vision changes ( blurred/ double/ loss): no                                                                                             Hoarseness or swallowing dysfunction: no                                                                                      Bowel changes( constipation/ diarrhea): no                                                                                   Weight change: yes, "since Old Timer's" as per husband   Exertional chest pain: no  Dyspnea on exertion: no  Cough: no  Hemoptysis: no  New medications: no  Leg swelling: no  PND: no  Melena/ rectal bleeding: no, but dark  Adenopathy: no  Severe snoring: no  Daytime sleepiness: yes  Skin / hair / nail changes: no  Temperature intolerance( heat/ cold) : no                                                                                                 Feeling depressed: no, as per spouse Altered appetite: no  Poor sleep/ Apnea : no  Abnormal bruising / bleeding  or enlarged lymph nodes: no                                                                          PMH/ FH of thyroid disease: no    ER records reviewed: HCT 36; random glucose 238. A1c 7.6% on 08/16/2010.  Review of Systems  According to her daughter; her fasting blood sugars tend to range from 70-150 without hypoglycemia ; when she is managing her mother's nutrition. She states that sugars are much higher if her father is controlling his  Wife's diet. She has polydipsia and polyuria.     Objective:   Physical Exam Gen.: Healthy and well-nourished in appearance. She interacts minimally; the history is  provided by the daughter predominantly also to some extent by the husband. [name] is cooperative but basically sits on the exam table & does not interact verbally or visually. She exhibits a constant "cud chewing" type motion of the mandible.  Head: Normocephalic without obvious abnormalities;  no alopecia  Eyes: No corneal or conjunctival inflammation noted.  Mouth: Thick plaque is noted over the lower denture. Neck: No deformities, masses, or tenderness noted.  Thyroid  normal. Lungs: Normal respiratory effort; chest expands symmetrically. Lungs are clear to auscultation without rales, wheezes, or increased work of breathing. Heart: Normal rate and rhythm. Normal S1 and S2. No gallop, click, or rub. Grade 1/2 -1 systolic  murmur. Musculoskeletal/extremities:lordosis noted of  the thoracic  spine. No clubbing, cyanosis, edema, or deformity noted. Range of motion essentially normal for age normal .Tone & strength  normal.Joints normal. Nail health  good. She exhibits dramatic limping related to either the right hip or right knee with ambulation. She grimaces with range of motion right lower extremity but again it's not clear as to the etiology. Vascular: Carotid, radial artery, dorsalis pedis and dorsalis posterior tibial pulses are full and equal. No bruits present. Neurologic: As noted above; severe dementia present. Deep tendon reflexes symmetrical and normal.        Skin: Intact without suspicious lesions or rashes. Lymph: No cervical, axillary, or inguinal lymphadenopathy present. Psych: Mood and affect are normal. Normally interactive                                                                                         Assessment & Plan:  #1 fatigue   #2 abnormal gait; rule out knee  versus hip etiology  #3anemia, mild  #4 diabetes, adequate  control  Plan: CBC will be repeated with B12, folate, and iron panel. TSH will also be collected. Referral will be made to [facility] for home  physical therapy for gait assessment & assessment of home equipment needs

## 2010-09-01 LAB — CBC WITH DIFFERENTIAL/PLATELET
Basophils Absolute: 0.2 10*3/uL — ABNORMAL HIGH (ref 0.0–0.1)
Eosinophils Absolute: 0.1 10*3/uL (ref 0.0–0.7)
Hemoglobin: 12.4 g/dL (ref 12.0–15.0)
Lymphocytes Relative: 46.2 % — ABNORMAL HIGH (ref 12.0–46.0)
Lymphs Abs: 2.7 10*3/uL (ref 0.7–4.0)
MCHC: 32.8 g/dL (ref 30.0–36.0)
Monocytes Relative: 12.7 % — ABNORMAL HIGH (ref 3.0–12.0)
Neutro Abs: 2.1 10*3/uL (ref 1.4–7.7)
Platelets: 205 10*3/uL (ref 150.0–400.0)
RDW: 13.3 % (ref 11.5–14.6)

## 2010-09-01 LAB — IBC PANEL: Saturation Ratios: 31.8 % (ref 20.0–50.0)

## 2010-09-01 LAB — VITAMIN B12: Vitamin B-12: 454 pg/mL (ref 211–911)

## 2010-09-03 ENCOUNTER — Telehealth: Payer: Self-pay | Admitting: Internal Medicine

## 2010-09-03 ENCOUNTER — Ambulatory Visit (INDEPENDENT_AMBULATORY_CARE_PROVIDER_SITE_OTHER): Payer: Medicare Other | Admitting: *Deleted

## 2010-09-03 ENCOUNTER — Telehealth: Payer: Self-pay | Admitting: Cardiology

## 2010-09-03 ENCOUNTER — Other Ambulatory Visit: Payer: Self-pay | Admitting: Internal Medicine

## 2010-09-03 DIAGNOSIS — D7389 Other diseases of spleen: Secondary | ICD-10-CM

## 2010-09-03 DIAGNOSIS — I498 Other specified cardiac arrhythmias: Secondary | ICD-10-CM

## 2010-09-03 DIAGNOSIS — Z7901 Long term (current) use of anticoagulants: Secondary | ICD-10-CM

## 2010-09-03 DIAGNOSIS — D735 Infarction of spleen: Secondary | ICD-10-CM

## 2010-09-03 LAB — POCT INR: INR: 2.2

## 2010-09-03 NOTE — Telephone Encounter (Signed)
Walk In pt Form " pt has Concerns" please call sent to [facility] 09/03/10/km

## 2010-09-03 NOTE — Progress Notes (Signed)
Pacer check

## 2010-09-03 NOTE — Telephone Encounter (Signed)
LMOM for pt that we can try and check her device today at her [facility] appointment n=but I am not sure what that schedule looks like

## 2010-09-03 NOTE — Telephone Encounter (Signed)
Per pt daughter calling regarding her mother C/O trouble walking, sweaty, pt went to  Emergency on  5/17 & see Dr. Alwyn Ren x 2 . Can pt have her pacer maker check since she coming in today to see the coumadin clinic.

## 2010-09-09 ENCOUNTER — Other Ambulatory Visit: Payer: Self-pay | Admitting: Internal Medicine

## 2010-09-09 NOTE — Telephone Encounter (Signed)
RX sent to pharmacy  

## 2010-09-17 ENCOUNTER — Other Ambulatory Visit: Payer: Self-pay | Admitting: Internal Medicine

## 2010-09-17 NOTE — Telephone Encounter (Signed)
Lipids 272.4 Due

## 2010-09-28 ENCOUNTER — Other Ambulatory Visit: Payer: Self-pay | Admitting: Internal Medicine

## 2010-09-28 NOTE — Telephone Encounter (Signed)
Refill sent.

## 2010-10-05 ENCOUNTER — Encounter: Payer: Self-pay | Admitting: Internal Medicine

## 2010-10-05 ENCOUNTER — Ambulatory Visit (INDEPENDENT_AMBULATORY_CARE_PROVIDER_SITE_OTHER): Payer: Medicare Other | Admitting: Internal Medicine

## 2010-10-05 ENCOUNTER — Encounter: Payer: Medicare Other | Admitting: *Deleted

## 2010-10-05 ENCOUNTER — Ambulatory Visit (INDEPENDENT_AMBULATORY_CARE_PROVIDER_SITE_OTHER): Payer: Medicare Other | Admitting: *Deleted

## 2010-10-05 DIAGNOSIS — D735 Infarction of spleen: Secondary | ICD-10-CM

## 2010-10-05 DIAGNOSIS — I10 Essential (primary) hypertension: Secondary | ICD-10-CM

## 2010-10-05 DIAGNOSIS — Z950 Presence of cardiac pacemaker: Secondary | ICD-10-CM

## 2010-10-05 DIAGNOSIS — Z7901 Long term (current) use of anticoagulants: Secondary | ICD-10-CM

## 2010-10-05 LAB — POCT INR: INR: 2.5

## 2010-10-05 NOTE — Patient Instructions (Signed)
Your physician wants you to follow-up in:  12 months.  You will receive a reminder letter in the mail two months in advance. If you don't receive a letter, please call our office to schedule the follow-up appointment.   

## 2010-10-06 ENCOUNTER — Encounter: Payer: Self-pay | Admitting: Internal Medicine

## 2010-10-06 NOTE — Progress Notes (Signed)
HPI [name] returns today for PPM followup. She is a middle aged woman with dementia, HTN, symptomatic bradycardia and is s/p PPM. Her family notes that she has had some trouble getting around and that her gait is more shuffling than it was. She has had no syncope but her family notes some peripheral edema. Allergies  Allergen Reactions  . Ace Inhibitors     REACTION: cough     Current Outpatient Prescriptions  Medication Sig Dispense Refill  . DIOVAN 320 MG tablet TAKE 1 TABLET BY MOUTH EVERY DAY  30 tablet  3  . donepezil (ARICEPT) 10 MG tablet TAKE 1 TABLET BY MOUTH EVERY DAY  30 tablet  4  . folic acid (FOLVITE) 1 MG tablet Take 1 mg by mouth daily.        Marland Kitchen glucose blood test strip       . KLOR-CON M20 20 MEQ tablet TAKE 1 TABLET TWICE DAILY  60 tablet  11  . LANTUS 100 UNIT/ML injection USE AS DIRECTED  20 mL  5  . LORazepam (ATIVAN) 1 MG tablet Take 1/2-1 tablet every 8 hours as needed for agitation.  60 tablet  1  . metFORMIN (GLUCOPHAGE-XR) 500 MG 24 hr tablet Take 500 mg by mouth 2 (two) times daily with a meal.        . NAMENDA 10 MG tablet TAKE 1 TABLET TWICE DAILY  60 tablet  11  . pravastatin (PRAVACHOL) 40 MG tablet TAKE 1 TABLET AT BEDTIME  30 tablet  0  . warfarin (COUMADIN) 5 MG tablet USE AS DIRECTED BY ANTICAGULATION CLINIC  40 tablet  3     Past Medical History  Diagnosis Date  . Dysphagia   . Diabetes mellitus   . Alzheimer disease   . Hypokalemia   . Anemia   . Cardiac pacemaker in situ 10/06/2009    ROS:   All systems reviewed and negative except as noted in the HPI.   Past Surgical History  Procedure Date  . Syncope pacer 05/2001     Family History  Problem Relation Age of Onset  . Alzheimer's disease    . Diabetes       History   Social History  . Marital Status: Married    Spouse Name: N/A    Number of Children: N/A  . Years of Education: N/A   Occupational History  . Not on file.   Social History Main Topics  . Smoking  status: Never Smoker   . Smokeless tobacco: Not on file   Comment: Patient exposed to second hand smoke daily   . Alcohol Use: No  . Drug Use: No  . Sexually Active: Not on file   Other Topics Concern  . Not on file   Social History Narrative   Household includes her husband, one daughter, one granddaughter, one Physicist, medical and a son who is a Ecologist and spends most of the time outside the house.     BP 136/82  Pulse 66  Ht 5\' 6"  (1.676 m)  Wt 200 lb (90.719 kg)  BMI 32.28 kg/m2  Physical Exam:  Diskempt appearing middled woman NAD HEENT: Unremarkable Neck:  No JVD, no thyromegally Lymphatics:  No adenopathy Back:  No CVA tenderness Lungs:  Clear. Well healed PPM incision. HEART:  Regular rate rhythm, no murmurs, no rubs, no clicks Abd:  Obese, positive bowel sounds, no organomegally, no rebound, no guarding Ext:  2 plus pulses, no edema, no cyanosis, no  clubbing Skin:  No rashes no nodules Neuro:  CN II through XII intact, motor grossly intact  DEVICE  Normal device function.  See PaceArt for details.   Assess/Plan:

## 2010-10-06 NOTE — Assessment & Plan Note (Signed)
Her blood pressure is fairly well controlled. She will continue her current meds. 

## 2010-10-06 NOTE — Assessment & Plan Note (Signed)
Her device is working normally. I have asked her to return in several months for device followup.

## 2010-10-14 ENCOUNTER — Other Ambulatory Visit: Payer: Self-pay | Admitting: Internal Medicine

## 2010-10-14 MED ORDER — PRAVASTATIN SODIUM 40 MG PO TABS: 40.0000 mg | ORAL_TABLET | Freq: Every day | ORAL | Status: DC

## 2010-10-14 NOTE — Telephone Encounter (Signed)
RX sent to pharmacy, only #30 given- Labs DUE Lipid/Hep 272.4/995.20

## 2010-10-14 NOTE — Telephone Encounter (Signed)
Patient needs refill for pravastatin - [facility] [address] - she called pharmacy 2 days ago - she needs t pick up today

## 2010-10-22 ENCOUNTER — Other Ambulatory Visit: Payer: Self-pay | Admitting: Internal Medicine

## 2010-11-05 ENCOUNTER — Ambulatory Visit (INDEPENDENT_AMBULATORY_CARE_PROVIDER_SITE_OTHER): Payer: Medicare Other | Admitting: *Deleted

## 2010-11-05 DIAGNOSIS — D735 Infarction of spleen: Secondary | ICD-10-CM

## 2010-11-05 DIAGNOSIS — D7389 Other diseases of spleen: Secondary | ICD-10-CM

## 2010-11-05 DIAGNOSIS — Z7901 Long term (current) use of anticoagulants: Secondary | ICD-10-CM

## 2010-11-11 ENCOUNTER — Other Ambulatory Visit: Payer: Self-pay | Admitting: Internal Medicine

## 2010-11-11 NOTE — Telephone Encounter (Signed)
Patient daughter returned call - appt schedule [phone]

## 2010-11-11 NOTE — Telephone Encounter (Signed)
Left message on voicemail for patient to call for fasting lab appointment. Lipid/Hep 272.4/995.20

## 2010-11-15 ENCOUNTER — Other Ambulatory Visit: Payer: Self-pay | Admitting: Internal Medicine

## 2010-11-15 DIAGNOSIS — T887XXA Unspecified adverse effect of drug or medicament, initial encounter: Secondary | ICD-10-CM

## 2010-11-15 DIAGNOSIS — E785 Hyperlipidemia, unspecified: Secondary | ICD-10-CM

## 2010-11-16 ENCOUNTER — Other Ambulatory Visit (INDEPENDENT_AMBULATORY_CARE_PROVIDER_SITE_OTHER): Payer: Medicare Other

## 2010-11-16 DIAGNOSIS — E785 Hyperlipidemia, unspecified: Secondary | ICD-10-CM

## 2010-11-16 DIAGNOSIS — T887XXA Unspecified adverse effect of drug or medicament, initial encounter: Secondary | ICD-10-CM

## 2010-11-16 LAB — HEPATIC FUNCTION PANEL
ALT: 15 U/L (ref 0–35)
AST: 16 U/L (ref 0–37)
Albumin: 4.1 g/dL (ref 3.5–5.2)
Total Bilirubin: 0.4 mg/dL (ref 0.3–1.2)
Total Protein: 7.5 g/dL (ref 6.0–8.3)

## 2010-11-16 LAB — LIPID PANEL
Cholesterol: 190 mg/dL (ref 0–200)
HDL: 50.4 mg/dL (ref >39.00)
Triglycerides: 371 mg/dL — ABNORMAL HIGH (ref 0.0–149.0)

## 2010-11-16 LAB — LDL CHOLESTEROL, DIRECT: Direct LDL: 84.8 mg/dL

## 2010-11-16 NOTE — Progress Notes (Signed)
Labs only

## 2010-12-03 ENCOUNTER — Ambulatory Visit (INDEPENDENT_AMBULATORY_CARE_PROVIDER_SITE_OTHER): Payer: Medicare Other | Admitting: *Deleted

## 2010-12-03 DIAGNOSIS — D735 Infarction of spleen: Secondary | ICD-10-CM

## 2010-12-03 DIAGNOSIS — Z7901 Long term (current) use of anticoagulants: Secondary | ICD-10-CM

## 2010-12-03 LAB — POCT INR: INR: 3.1

## 2010-12-07 ENCOUNTER — Encounter: Payer: Medicare Other | Admitting: *Deleted

## 2010-12-14 ENCOUNTER — Other Ambulatory Visit: Payer: Self-pay | Admitting: Internal Medicine

## 2010-12-15 ENCOUNTER — Other Ambulatory Visit: Payer: Self-pay | Admitting: Internal Medicine

## 2011-01-03 ENCOUNTER — Other Ambulatory Visit: Payer: Self-pay | Admitting: Internal Medicine

## 2011-01-03 MED ORDER — LORAZEPAM 1 MG PO TABS: ORAL_TABLET | ORAL | Status: DC

## 2011-01-03 NOTE — Telephone Encounter (Signed)
RX sent

## 2011-01-04 ENCOUNTER — Other Ambulatory Visit: Payer: Self-pay | Admitting: Pharmacist

## 2011-01-04 MED ORDER — WARFARIN SODIUM 5 MG PO TABS: ORAL_TABLET | ORAL | Status: DC

## 2011-01-05 ENCOUNTER — Ambulatory Visit (INDEPENDENT_AMBULATORY_CARE_PROVIDER_SITE_OTHER): Payer: Medicare Other | Admitting: *Deleted

## 2011-01-05 DIAGNOSIS — D735 Infarction of spleen: Secondary | ICD-10-CM

## 2011-01-05 DIAGNOSIS — D7389 Other diseases of spleen: Secondary | ICD-10-CM

## 2011-01-05 DIAGNOSIS — Z7901 Long term (current) use of anticoagulants: Secondary | ICD-10-CM

## 2011-01-25 ENCOUNTER — Telehealth: Payer: Self-pay

## 2011-01-25 NOTE — Telephone Encounter (Signed)
Please decrease the Lantus by 5 units each day. Monitor the glucose every 30 minutes until it is back over 100. She needs to be seen with all glucose recordings and all medicines. Recurrent hypoglycemic spells must be avoided.     Goals for home glucose monitoring are : fasting  or morning glucose goal of  90-150. Two hours after any meal , goal = < 180, preferably < 150.

## 2011-01-25 NOTE — Telephone Encounter (Signed)
Left message to call office

## 2011-01-25 NOTE — Telephone Encounter (Signed)
At 8am Blood sugar was 45, patient's daughter waited - gave her mother orange juice and checked again: Blood sugar 52, patient does not have any other symptoms (dizziness, weakness, blurred vision, or faint feeling) patient's daughter is concerned that if she is not in the home (if she has to step out to run errands) her dad will not feed her mom. I offered home health and Coralee North stated that her dad will not allow anyone in there home and refuses to put Mrs.Koch in a nursing home .  Coralee North would like for Dr.Hopper to be informed of this situation and would like any recommendations. I offered appointment and Coralee North stated it is a lot to get both her mom and dad ready to bring in for an appointment.  Dr.Hopper please advise

## 2011-01-26 NOTE — Telephone Encounter (Signed)
Discuss with patient daughter Coralee North.

## 2011-01-28 ENCOUNTER — Ambulatory Visit (INDEPENDENT_AMBULATORY_CARE_PROVIDER_SITE_OTHER): Payer: Medicare Other | Admitting: Internal Medicine

## 2011-01-28 DIAGNOSIS — R631 Polydipsia: Secondary | ICD-10-CM

## 2011-01-28 DIAGNOSIS — E119 Type 2 diabetes mellitus without complications: Secondary | ICD-10-CM

## 2011-01-28 DIAGNOSIS — I10 Essential (primary) hypertension: Secondary | ICD-10-CM

## 2011-01-28 DIAGNOSIS — R358 Other polyuria: Secondary | ICD-10-CM

## 2011-01-28 DIAGNOSIS — E162 Hypoglycemia, unspecified: Secondary | ICD-10-CM

## 2011-01-28 DIAGNOSIS — G309 Alzheimer's disease, unspecified: Secondary | ICD-10-CM

## 2011-01-28 LAB — BASIC METABOLIC PANEL
BUN: 16 mg/dL (ref 6–23)
CO2: 27 mEq/L (ref 19–32)
Calcium: 9.3 mg/dL (ref 8.4–10.5)
Creatinine, Ser: 1 mg/dL (ref 0.4–1.2)
GFR: 67.32 mL/min (ref >60.00)
Glucose, Bld: 80 mg/dL (ref 70–99)

## 2011-01-28 NOTE — Patient Instructions (Signed)
Diabetes Monitor   The A1c test is used primarily to monitor the glucose control of diabetics over time. The goal of those with diabetes is to keep their blood glucose levels as close to normal as possible. This helps to minimize the complications caused by chronically elevated glucose levels, such as progressive damage to body organs like the kidneys, eyes, cardiovascular system, and nerves. The A1c test gives a picture of the average amount of glucose in the blood over the last few months. It can help a patient and his doctor know if the measures they are taking to control the patient's diabetes are successful or need to be adjusted.    NORMAL VALUES  Non diabetic adults: 5 %-6.1%  Good diabetic control: 6.2-6.4 %  Fair diabetic control: 6.5-7%  Poor diabetic control: greater than 7 % ( except with additional factors such as  advanced age; significant coronary or neurologic disease,etc). Her A1c goal will be < 8%. Goals for home glucose monitoring are : fasting  or morning glucose goal of  120- 180.Marland Kitchen Two hours after any meal , goal = < 200,, preferably < 180

## 2011-01-28 NOTE — Progress Notes (Signed)
Subjective:    Patient ID: [name], female    DOB: [birth year], 70 y.o.   MRN: [id number]  HPI Diabetes status assessment:recent HYPOGLYCEMIA prompting OV. History is provided by her daughter, [name] Fasting or morning glucose range:  45 (on 10/23) - 91. Based on this her insulin was decreased by 5 units as of yesterday to 35 units. Highest glucose 2 hours after any meal: checked once : 100.                                                   Excess thirst :always drinking;  Excess hunger:  no ;  Excess urination:  yes.                                  Lightheadedness with standing:  no. Chest pain:  no ; Palpitations :no ;  Pain in  calves with walking:  no .                                                                                                                                 Non healing skin  ulcers or sores,especially over the feet:  no. Numbness or tingling or burning in feet : no .                                                                                                                                              Significant change in  Weight : no. Vision changes : progressive vision loss, seeing Ophth .                                                                    Exercise : sedentary . Nutrition/diet:  No plan but decreased white carbs. Medication compliance : yes. Foot care : not in 2012.  A1c/ urine microalbumin monitor:  due  Review of Systems     Objective:   Physical Exam Gen.:  well-nourished, slightly lethargic ,  Slight weight excess Eyes: No lid/conjunctival changes Neck: Normal thyroid Mouth: ill fitting lower dentures Respiratory: No increased work of breathing ; minimal rhonchi Cardiac : regular rhythm, no extra heart sounds, gallop, murmur..S4 Abdomen: No organomegaly ,masses, bruits or aortic enlargement Lymph: No lymphadenopathy of the neck or axilla Skin: No rashes, lesions, ulcers or ischemic changes Muscle skeletal: no nail  changes; nails healthy Vasc:All pulses intact, no bruits present Neuro: Normal deep tendon reflexes, non verbal except for rare answers to queries ( daughter is historian), sensation over feet normal to light touch Psych:abnormal  judgment and insight due to dementia      Assessment & Plan:  #1 diabetes with significant hypoglycemic episodes  #2 dementia which impacts compliance  #3 Polydipsia; rule out delusional hyponatremia  #4 oliguria probably secondary to #3; rule out UTI

## 2011-02-01 ENCOUNTER — Telehealth: Payer: Self-pay

## 2011-02-01 NOTE — Telephone Encounter (Signed)
Paper fax sent over, patient is having a hard time taking pill, caregiver would like to know if med can be crushed (patient would have to switch to plain metformin, XR can NOT be crushed )   Per Dr.Hoopper ok to change to plain, faxed paper fax back with Dr.Hopper's approval

## 2011-02-03 ENCOUNTER — Telehealth: Payer: Self-pay | Admitting: Internal Medicine

## 2011-02-03 MED ORDER — VALSARTAN 320 MG PO TABS: 320.0000 mg | ORAL_TABLET | Freq: Every day | ORAL | Status: DC

## 2011-02-03 NOTE — Telephone Encounter (Signed)
Done

## 2011-02-03 NOTE — Telephone Encounter (Signed)
[facility] [address]

## 2011-02-04 ENCOUNTER — Ambulatory Visit (INDEPENDENT_AMBULATORY_CARE_PROVIDER_SITE_OTHER): Payer: Medicare Other | Admitting: *Deleted

## 2011-02-04 DIAGNOSIS — D735 Infarction of spleen: Secondary | ICD-10-CM

## 2011-02-04 DIAGNOSIS — D7389 Other diseases of spleen: Secondary | ICD-10-CM

## 2011-02-04 DIAGNOSIS — Z7901 Long term (current) use of anticoagulants: Secondary | ICD-10-CM

## 2011-03-07 ENCOUNTER — Ambulatory Visit (INDEPENDENT_AMBULATORY_CARE_PROVIDER_SITE_OTHER): Payer: Medicare Other | Admitting: *Deleted

## 2011-03-07 DIAGNOSIS — D735 Infarction of spleen: Secondary | ICD-10-CM

## 2011-03-07 DIAGNOSIS — Z7901 Long term (current) use of anticoagulants: Secondary | ICD-10-CM

## 2011-03-07 DIAGNOSIS — D7389 Other diseases of spleen: Secondary | ICD-10-CM

## 2011-03-26 ENCOUNTER — Other Ambulatory Visit: Payer: Self-pay | Admitting: Internal Medicine

## 2011-04-01 ENCOUNTER — Other Ambulatory Visit: Payer: Self-pay | Admitting: Internal Medicine

## 2011-04-07 ENCOUNTER — Ambulatory Visit (INDEPENDENT_AMBULATORY_CARE_PROVIDER_SITE_OTHER): Payer: Medicare Other | Admitting: *Deleted

## 2011-04-07 DIAGNOSIS — Z7901 Long term (current) use of anticoagulants: Secondary | ICD-10-CM

## 2011-04-07 DIAGNOSIS — D735 Infarction of spleen: Secondary | ICD-10-CM

## 2011-04-07 DIAGNOSIS — D7389 Other diseases of spleen: Secondary | ICD-10-CM

## 2011-04-07 LAB — POCT INR: INR: 2.8

## 2011-05-06 ENCOUNTER — Ambulatory Visit (INDEPENDENT_AMBULATORY_CARE_PROVIDER_SITE_OTHER): Payer: Medicare Other | Admitting: *Deleted

## 2011-05-06 DIAGNOSIS — D735 Infarction of spleen: Secondary | ICD-10-CM

## 2011-05-06 DIAGNOSIS — Z7901 Long term (current) use of anticoagulants: Secondary | ICD-10-CM

## 2011-05-06 DIAGNOSIS — D7389 Other diseases of spleen: Secondary | ICD-10-CM

## 2011-05-06 LAB — POCT INR: INR: 2.5

## 2011-05-19 ENCOUNTER — Encounter: Payer: Medicare Other | Admitting: *Deleted

## 2011-06-02 ENCOUNTER — Other Ambulatory Visit: Payer: Self-pay | Admitting: Internal Medicine

## 2011-06-03 ENCOUNTER — Ambulatory Visit (INDEPENDENT_AMBULATORY_CARE_PROVIDER_SITE_OTHER): Payer: Medicare Other

## 2011-06-03 DIAGNOSIS — Z7901 Long term (current) use of anticoagulants: Secondary | ICD-10-CM

## 2011-06-03 DIAGNOSIS — D735 Infarction of spleen: Secondary | ICD-10-CM

## 2011-06-03 DIAGNOSIS — D7389 Other diseases of spleen: Secondary | ICD-10-CM

## 2011-06-03 NOTE — Telephone Encounter (Signed)
Patient is due for appointment in May 2013.  Refilled prescriptions until then.

## 2011-06-09 ENCOUNTER — Other Ambulatory Visit: Payer: Self-pay | Admitting: Internal Medicine

## 2011-06-09 NOTE — Telephone Encounter (Signed)
Prescription sent to pharmacy.

## 2011-06-20 ENCOUNTER — Telehealth: Payer: Self-pay | Admitting: Internal Medicine

## 2011-06-20 MED ORDER — LORAZEPAM 1 MG PO TABS: ORAL_TABLET | ORAL | Status: DC

## 2011-06-20 NOTE — Telephone Encounter (Signed)
Refill for  Lorazepam 1MG  Tablet No qty listed Take 1/2 to 1 tablet every 8 hours as needed for agitation

## 2011-06-20 NOTE — Telephone Encounter (Signed)
RX sent

## 2011-07-06 ENCOUNTER — Ambulatory Visit (INDEPENDENT_AMBULATORY_CARE_PROVIDER_SITE_OTHER): Payer: Medicare Other | Admitting: *Deleted

## 2011-07-06 DIAGNOSIS — D735 Infarction of spleen: Secondary | ICD-10-CM

## 2011-07-06 DIAGNOSIS — Z7901 Long term (current) use of anticoagulants: Secondary | ICD-10-CM

## 2011-07-06 DIAGNOSIS — D7389 Other diseases of spleen: Secondary | ICD-10-CM

## 2011-07-11 ENCOUNTER — Other Ambulatory Visit: Payer: Self-pay | Admitting: Internal Medicine

## 2011-07-11 NOTE — Telephone Encounter (Signed)
A1C 250.00 

## 2011-08-02 ENCOUNTER — Other Ambulatory Visit: Payer: Self-pay | Admitting: Internal Medicine

## 2011-08-02 DIAGNOSIS — Z1231 Encounter for screening mammogram for malignant neoplasm of breast: Secondary | ICD-10-CM

## 2011-08-05 ENCOUNTER — Ambulatory Visit (INDEPENDENT_AMBULATORY_CARE_PROVIDER_SITE_OTHER): Payer: Medicare Other | Admitting: *Deleted

## 2011-08-05 DIAGNOSIS — D735 Infarction of spleen: Secondary | ICD-10-CM

## 2011-08-05 DIAGNOSIS — D7389 Other diseases of spleen: Secondary | ICD-10-CM

## 2011-08-05 DIAGNOSIS — Z7901 Long term (current) use of anticoagulants: Secondary | ICD-10-CM

## 2011-08-08 ENCOUNTER — Telehealth: Payer: Self-pay | Admitting: Internal Medicine

## 2011-08-08 NOTE — Telephone Encounter (Signed)
08-08-11  lmm @ 335pm for pt to call for July pacer with taylor/mt

## 2011-08-09 ENCOUNTER — Ambulatory Visit: Payer: Medicare Other

## 2011-08-19 ENCOUNTER — Ambulatory Visit (INDEPENDENT_AMBULATORY_CARE_PROVIDER_SITE_OTHER): Payer: Medicare Other | Admitting: *Deleted

## 2011-08-19 ENCOUNTER — Other Ambulatory Visit: Payer: Self-pay | Admitting: Internal Medicine

## 2011-08-19 DIAGNOSIS — Z7901 Long term (current) use of anticoagulants: Secondary | ICD-10-CM

## 2011-08-19 DIAGNOSIS — D7389 Other diseases of spleen: Secondary | ICD-10-CM

## 2011-08-19 DIAGNOSIS — D735 Infarction of spleen: Secondary | ICD-10-CM

## 2011-08-31 ENCOUNTER — Other Ambulatory Visit: Payer: Self-pay | Admitting: Internal Medicine

## 2011-09-01 ENCOUNTER — Other Ambulatory Visit: Payer: Self-pay | Admitting: Internal Medicine

## 2011-09-16 ENCOUNTER — Ambulatory Visit (INDEPENDENT_AMBULATORY_CARE_PROVIDER_SITE_OTHER): Payer: Medicare Other | Admitting: *Deleted

## 2011-09-16 DIAGNOSIS — Z7901 Long term (current) use of anticoagulants: Secondary | ICD-10-CM

## 2011-09-16 DIAGNOSIS — D7389 Other diseases of spleen: Secondary | ICD-10-CM

## 2011-09-16 DIAGNOSIS — D735 Infarction of spleen: Secondary | ICD-10-CM

## 2011-10-14 ENCOUNTER — Ambulatory Visit (INDEPENDENT_AMBULATORY_CARE_PROVIDER_SITE_OTHER): Payer: Medicare Other | Admitting: *Deleted

## 2011-10-14 DIAGNOSIS — D735 Infarction of spleen: Secondary | ICD-10-CM

## 2011-10-14 DIAGNOSIS — D7389 Other diseases of spleen: Secondary | ICD-10-CM

## 2011-10-14 DIAGNOSIS — Z7901 Long term (current) use of anticoagulants: Secondary | ICD-10-CM

## 2011-10-14 LAB — POCT INR: INR: 2.3

## 2011-10-31 ENCOUNTER — Other Ambulatory Visit: Payer: Self-pay | Admitting: Internal Medicine

## 2011-11-11 ENCOUNTER — Ambulatory Visit (INDEPENDENT_AMBULATORY_CARE_PROVIDER_SITE_OTHER): Payer: Medicare Other | Admitting: *Deleted

## 2011-11-11 DIAGNOSIS — D735 Infarction of spleen: Secondary | ICD-10-CM

## 2011-11-11 DIAGNOSIS — D7389 Other diseases of spleen: Secondary | ICD-10-CM

## 2011-11-11 DIAGNOSIS — Z7901 Long term (current) use of anticoagulants: Secondary | ICD-10-CM

## 2011-11-22 ENCOUNTER — Encounter: Payer: Self-pay | Admitting: Internal Medicine

## 2011-11-22 ENCOUNTER — Encounter: Payer: Self-pay | Admitting: Cardiology

## 2011-11-22 ENCOUNTER — Ambulatory Visit (INDEPENDENT_AMBULATORY_CARE_PROVIDER_SITE_OTHER): Payer: Medicare Other | Admitting: Cardiology

## 2011-11-22 VITALS — BP 116/60 | HR 64 | Ht 66.0 in | Wt 178.0 lb

## 2011-11-22 DIAGNOSIS — R001 Bradycardia, unspecified: Secondary | ICD-10-CM

## 2011-11-22 DIAGNOSIS — Z950 Presence of cardiac pacemaker: Secondary | ICD-10-CM

## 2011-11-22 DIAGNOSIS — I498 Other specified cardiac arrhythmias: Secondary | ICD-10-CM

## 2011-11-22 LAB — PACEMAKER DEVICE OBSERVATION
AL AMPLITUDE: 2 mv
BAMS-0001: 150 {beats}/min
DEVICE MODEL PM: 2056953
RV LEAD AMPLITUDE: 3.5 mv
RV LEAD THRESHOLD: 2 V
VENTRICULAR PACING PM: 1

## 2011-11-22 NOTE — Progress Notes (Signed)
ELECTROPHYSIOLOGY OFFICE NOTE  Patient ID: [name] MRN: [id number], DOB/AGE: 1940/05/20   Date of Visit: 11/22/2011  Primary Physician: Marga Melnick, MD Primary Cardiologist: Lewayne Bunting, MD Reason for Visit: Device follow-up  History of Present Illness [name] is a 71 year old woman with symptomatic bradycardia s/p PPM and dementia who presents today for device follow-up. She is accompanied by her husband and son who assists with history. She denies complaints and her family states she is doing well. She has not had any trouble with chest pain, SOB or syncope.  Past Medical History  Diagnosis Date  . Dysphagia   . Diabetes mellitus   . Alzheimer disease   . Hypokalemia   . Anemia   . Cardiac pacemaker in situ 10/06/2009    Past Surgical History  Procedure Date  . Syncope pacer 05/2001     Allergies/Intolerances Allergies  Allergen Reactions  . Ace Inhibitors     REACTION: cough    Current Home Medications Current Outpatient Prescriptions  Medication Sig Dispense Refill  . AMBULATORY NON FORMULARY MEDICATION Rich Red Blood Cells: 1 by mouth daily       . [facility] Ultra Thin Lancets MISC USE AS DIRECTED 1 TO 2 TIMES A DAY  100 each  4  . DIOVAN 320 MG tablet TAKE 1 TABLET BY MOUTH DAILY  30 tablet  5  . donepezil (ARICEPT) 10 MG tablet TAKE 1 TABLET BY MOUTH EVERY DAY  30 tablet  5  . folic acid (FOLVITE) 1 MG tablet Take 1 mg by mouth daily.        Marland Kitchen FREESTYLE LITE test strip USE AS DIRECTED 1 TO 2 TIMES A DAY  100 each  3  . insulin glargine (LANTUS) 100 UNIT/ML injection       . KLOR-CON M20 20 MEQ tablet TAKE 1 TABLET TWICE DAILY  60 tablet  5  . LANTUS 100 UNIT/ML injection USE AS DIRECTED  20 mL  1  . LORazepam (ATIVAN) 1 MG tablet Take 1/2-1 tablet every 8 hours as needed for agitation.  60 tablet  1  . metFORMIN (GLUCOPHAGE) 500 MG tablet Take 500 mg by mouth 2 (two) times daily with a meal.       . NAMENDA 10 MG tablet TAKE 1 TABLET TWICE DAILY  60 tablet   5  . pravastatin (PRAVACHOL) 40 MG tablet TAKE 1 TABLET BY MOUTH EVERY DAY  30 tablet  11  . warfarin (COUMADIN) 5 MG tablet TAKE AS DIRECTED BY ANTICOAGULATION CLINIC  40 tablet  3    Social History Social History  . Marital Status: Married   Social History Main Topics  . Smoking status: Never Smoker   . Smokeless tobacco: Not on file   Comment: Patient exposed to second hand smoke daily   . Alcohol Use: No  . Drug Use: No  . Sexually Active: Not on file   Social History Narrative   Household includes her husband, one daughter, one granddaughter, one great-granddaughter and a son who is a Ecologist and spends most of the time outside the house.    Review of Systems - difficut to obtain from patient; per family - General: No chills, fever, night sweats or weight changes Cardiovascular: No chest pain, dyspnea on exertion, edema, orthopnea, palpitations, paroxysmal nocturnal dyspnea Dermatological: No rash, lesions or masses Respiratory: No cough, dyspnea Urologic: No hematuria, dysuria Abdominal: No nausea, vomiting, diarrhea, bright red blood per rectum, melena, or hematemesis Neurologic:  No visual changes, weakness, changes in mental status All other systems reviewed and are otherwise negative except as noted above.  Physical Exam Blood pressure 116/60, pulse 64, height 5\' 6"  (1.676 m), weight 178 lb (80.74 kg).  General: Well developed, well appearing 71 year old female in no acute distress. HEENT: Normocephalic, atraumatic. EOMs intact. Sclera nonicteric. Oropharynx clear. +Dentures.  Neck: Supple. No JVD. Lungs:  Respirations regular and unlabored, CTA bilaterally. No wheezes, rales or rhonchi. Heart: RRR. S1, S2 present. No murmurs, rub, S3 or S4. Abdomen: Soft, non-distended. Extremities: No clubbing, cyanosis or edema.  Skin: Implant site intact and well healed.   Diagnostics Device interrogation shows normal device function with good battery status and stable  lead measurements/parameters; see PaceArt report  Assessment and Plan 1. Symptomatic bradycardia s/p PPM - normal device function; continue routine device follow-up every 6 months unless needed sooner  Ms. Lynde's family expressed verbal understanding and agree with this plan of care. Signed, Rick Duff, PA-C 11/22/2011, 2:48 PM

## 2011-11-22 NOTE — Patient Instructions (Addendum)
Your physician wants you to follow-up in: 6 months with the device clinic and 12 months with Dr Taylor You will receive a reminder letter in the mail two months in advance. If you don't receive a letter, please call our office to schedule the follow-up appointment.  

## 2011-11-30 ENCOUNTER — Other Ambulatory Visit: Payer: Self-pay | Admitting: Internal Medicine

## 2011-12-01 NOTE — Telephone Encounter (Signed)
A1C 250.00 

## 2011-12-08 ENCOUNTER — Ambulatory Visit: Payer: Medicare Other | Admitting: Internal Medicine

## 2011-12-08 DIAGNOSIS — Z0289 Encounter for other administrative examinations: Secondary | ICD-10-CM

## 2011-12-09 ENCOUNTER — Ambulatory Visit (INDEPENDENT_AMBULATORY_CARE_PROVIDER_SITE_OTHER): Payer: Medicare Other | Admitting: *Deleted

## 2011-12-09 DIAGNOSIS — Z7901 Long term (current) use of anticoagulants: Secondary | ICD-10-CM

## 2011-12-09 DIAGNOSIS — D735 Infarction of spleen: Secondary | ICD-10-CM

## 2011-12-09 DIAGNOSIS — D7389 Other diseases of spleen: Secondary | ICD-10-CM

## 2011-12-09 LAB — POCT INR: INR: 2.3

## 2011-12-16 ENCOUNTER — Encounter: Payer: Self-pay | Admitting: Internal Medicine

## 2011-12-16 ENCOUNTER — Ambulatory Visit (INDEPENDENT_AMBULATORY_CARE_PROVIDER_SITE_OTHER): Payer: Medicare Other | Admitting: Internal Medicine

## 2011-12-16 VITALS — BP 126/70 | HR 56 | Temp 98.2°F | Wt 179.8 lb

## 2011-12-16 DIAGNOSIS — G309 Alzheimer's disease, unspecified: Secondary | ICD-10-CM

## 2011-12-16 DIAGNOSIS — E785 Hyperlipidemia, unspecified: Secondary | ICD-10-CM

## 2011-12-16 DIAGNOSIS — E119 Type 2 diabetes mellitus without complications: Secondary | ICD-10-CM

## 2011-12-16 DIAGNOSIS — I10 Essential (primary) hypertension: Secondary | ICD-10-CM

## 2011-12-16 MED ORDER — VALSARTAN 160 MG PO TABS: 160.0000 mg | ORAL_TABLET | Freq: Two times a day (BID) | ORAL | Status: DC

## 2011-12-16 NOTE — Assessment & Plan Note (Addendum)
A1c will be checked; as noted she's on insulin every other day because of fasting sugars in the 70s  12/18/11: A1c 6 %. Insulin D/Ced ; A1c in 6 weeks.

## 2011-12-16 NOTE — Addendum Note (Signed)
Addended by: Silvio Pate D on: 12/16/2011 06:26 PM   Modules accepted: Orders

## 2011-12-16 NOTE — Assessment & Plan Note (Signed)
Lipids will be drawn; these are approximately 6.5-7 hours post meal

## 2011-12-16 NOTE — Patient Instructions (Addendum)
Blood Pressure Goal  Ideally is an AVERAGE < 135/85. This AVERAGE should be calculated from @ least 5-7 BP readings taken @ different times of day on different days of week. You should not respond to isolated BP readings , but rather the AVERAGE for that week.  If you activate My Chart; the results can be released to you as soon as they populate from the lab. If you choose not to use this program; the labs have to be reviewed, copied & mailed   causing a delay in getting the results to you.  

## 2011-12-16 NOTE — Assessment & Plan Note (Signed)
Blood pressure today is excellent. Blood pressure goals discussed. Diovan will be changed to generic

## 2011-12-16 NOTE — Progress Notes (Signed)
  Subjective:    Patient ID: [name], female    DOB: 20-Mar-1941, 71 y.o.   MRN: [id number]  HPI  Her [name] and his wife [name] have lived with the films for last 3 months and are  monitoring the diabetes. They are historians as she is unable to provide any meaningful information.  HYPERTENSION: Disease Monitoring: Blood pressure range-not monitored Medications: Compliance-yes     DIABETES: Disease Monitoring: Blood Sugar ranges-FBS were in 70s, prompting changing insulin to as needed ; now on qod insulin X 2 weeks   Hypoglycemic symptoms- no  HYPERLIPIDEMIA: Disease Monitoring: See symptoms for Hypertension Medications: Compliance- yes            Review of Systems Chest pain, palpitations-no       Dyspnea- no Lightheadedness,Syncope- occasional dizziness with fatigue    Edema- no Polyuria/phagia/dipsia-polydipsia       Visual problems- blurred due to cataracts Abd pain, bowel changes- some watery stool X 3 mos   Muscle aches- no      Objective:   Physical Exam Gen.:  well-nourished in appearance.Quiet but  cooperative throughout exam.   Eyes: No corneal or conjunctival inflammation noted.  Mouth: Oral mucosa and oropharynx reveal no lesions or exudates. Teeth in good repair. She has new dentures with dramatic improvement in the facial symmetry Neck: No deformities, masses, or tenderness noted.  Thyroid normal. Lungs: Normal respiratory effort; chest expands symmetrically. Lungs are clear to auscultation without rales, wheezes, or increased work of breathing. Heart: Slightly irregular, slow rate and rhythm.  No murmur. Abdomen: Bowel sounds normal; abdomen soft and nontender. No masses, organomegaly or hernias noted.                                                                               Musculoskeletal/extremities: Lordosis noted of  the thoracic  spine. Minimal  Clubbing. No cyanosis, edema, or deformity noted. Joints normal. Nail health   good. Vascular: Carotid, radial artery, dorsalis pedis and  posterior tibial pulses are full and equal. No bruits present. Neurologic:  Deep tendon reflexes symmetrical and normal.          Skin: Intact without suspicious lesions or rashes. Lymph: No cervical, axillary lymphadenopathy present. Psych: Mood and affect are normal. More  interactive  than previously                                                                                       Assessment & Plan:

## 2011-12-17 LAB — TSH: TSH: 1.681 u[IU]/mL (ref 0.350–4.500)

## 2011-12-17 LAB — BASIC METABOLIC PANEL
CO2: 25 mEq/L (ref 19–32)
Calcium: 9.1 mg/dL (ref 8.4–10.5)
Chloride: 106 mEq/L (ref 96–112)
Creat: 1.06 mg/dL (ref 0.50–1.10)
Glucose, Bld: 89 mg/dL (ref 70–99)

## 2011-12-17 LAB — LIPID PANEL
Cholesterol: 158 mg/dL (ref 0–200)
HDL: 42 mg/dL (ref >39)
Triglycerides: 226 mg/dL — ABNORMAL HIGH (ref <150)

## 2011-12-18 NOTE — Addendum Note (Signed)
Addended byPecola Lawless on: 12/18/2011 02:35 PM   Modules accepted: Orders

## 2011-12-23 ENCOUNTER — Telehealth: Payer: Self-pay

## 2011-12-23 ENCOUNTER — Other Ambulatory Visit: Payer: Self-pay | Admitting: Internal Medicine

## 2011-12-23 NOTE — Telephone Encounter (Signed)
Son states received labs asked if insulin dosage should be decrease to 17.5 or completely dc  After I reviewed lab results I advised pt to: A1c is in NON Diabetic range.Stop insulin completely; recheck A1c in 6 weeks .PLEASE BRING THESE INSTRUCTIONS TO FOLLOW UP LAB APPOINTMENT.This will guarantee correct labs are drawn, eliminating need for repeat blood sampling ( needle sticks ! ). Diagnoses /Codes: 250.00.  Pt would like to know should she stop metformin as well?    MW

## 2011-12-23 NOTE — Telephone Encounter (Signed)
STOP insulin completely; continue Metformin. Check A1c in 6 weeks off insulin.

## 2011-12-23 NOTE — Telephone Encounter (Signed)
Pt scheduled for lab 10/31.

## 2011-12-30 ENCOUNTER — Other Ambulatory Visit: Payer: Self-pay | Admitting: Internal Medicine

## 2011-12-30 NOTE — Telephone Encounter (Signed)
Rx sent.    MW 

## 2012-01-20 ENCOUNTER — Encounter (INDEPENDENT_AMBULATORY_CARE_PROVIDER_SITE_OTHER): Payer: Self-pay

## 2012-01-20 DIAGNOSIS — R0989 Other specified symptoms and signs involving the circulatory and respiratory systems: Secondary | ICD-10-CM

## 2012-01-29 ENCOUNTER — Other Ambulatory Visit: Payer: Self-pay | Admitting: Internal Medicine

## 2012-01-30 NOTE — Telephone Encounter (Signed)
Refill # 60; last labs were 9/13. Please  schedule A1c . Diagnoses /Codes: 250.00

## 2012-01-30 NOTE — Telephone Encounter (Signed)
Rx sent.    MW 

## 2012-01-30 NOTE — Telephone Encounter (Signed)
Called pt to schedule A1c lab and phone cut out while trying to see if person that answered the phone was the pt. Called back and person answering phone isn't the pt and seemed to not understand me so message may not get passed for pt to call in.     MW

## 2012-01-31 NOTE — Telephone Encounter (Signed)
Patient is already scheduled for A1c on 02/02/12.

## 2012-02-02 ENCOUNTER — Other Ambulatory Visit: Payer: Self-pay

## 2012-02-03 ENCOUNTER — Ambulatory Visit (INDEPENDENT_AMBULATORY_CARE_PROVIDER_SITE_OTHER): Payer: Self-pay

## 2012-02-03 DIAGNOSIS — Z7901 Long term (current) use of anticoagulants: Secondary | ICD-10-CM

## 2012-02-03 DIAGNOSIS — D735 Infarction of spleen: Secondary | ICD-10-CM

## 2012-03-02 ENCOUNTER — Other Ambulatory Visit: Payer: Self-pay | Admitting: Internal Medicine

## 2012-03-02 NOTE — Telephone Encounter (Signed)
Rx sent.    MW 

## 2012-03-09 ENCOUNTER — Ambulatory Visit (INDEPENDENT_AMBULATORY_CARE_PROVIDER_SITE_OTHER): Payer: Medicare Other | Admitting: *Deleted

## 2012-03-09 DIAGNOSIS — D7389 Other diseases of spleen: Secondary | ICD-10-CM

## 2012-03-09 DIAGNOSIS — D735 Infarction of spleen: Secondary | ICD-10-CM

## 2012-03-09 DIAGNOSIS — Z7901 Long term (current) use of anticoagulants: Secondary | ICD-10-CM

## 2012-03-09 LAB — POCT INR: INR: 2.6

## 2012-04-01 ENCOUNTER — Other Ambulatory Visit: Payer: Self-pay | Admitting: Internal Medicine

## 2012-04-02 NOTE — Telephone Encounter (Signed)
Refill done.  

## 2012-04-13 ENCOUNTER — Ambulatory Visit (INDEPENDENT_AMBULATORY_CARE_PROVIDER_SITE_OTHER): Payer: Medicare Other | Admitting: *Deleted

## 2012-04-13 DIAGNOSIS — D735 Infarction of spleen: Secondary | ICD-10-CM

## 2012-04-13 DIAGNOSIS — D7389 Other diseases of spleen: Secondary | ICD-10-CM

## 2012-04-13 DIAGNOSIS — Z7901 Long term (current) use of anticoagulants: Secondary | ICD-10-CM

## 2012-05-02 ENCOUNTER — Other Ambulatory Visit: Payer: Self-pay | Admitting: Internal Medicine

## 2012-05-03 NOTE — Telephone Encounter (Signed)
A1c 250.00 

## 2012-05-15 ENCOUNTER — Encounter: Payer: Self-pay | Admitting: *Deleted

## 2012-05-22 ENCOUNTER — Ambulatory Visit (INDEPENDENT_AMBULATORY_CARE_PROVIDER_SITE_OTHER): Payer: Medicare Other | Admitting: *Deleted

## 2012-05-22 DIAGNOSIS — D7389 Other diseases of spleen: Secondary | ICD-10-CM

## 2012-05-22 DIAGNOSIS — Z7901 Long term (current) use of anticoagulants: Secondary | ICD-10-CM

## 2012-05-22 DIAGNOSIS — D735 Infarction of spleen: Secondary | ICD-10-CM

## 2012-05-22 LAB — POCT INR: INR: 3.4

## 2012-06-03 ENCOUNTER — Other Ambulatory Visit: Payer: Self-pay | Admitting: Internal Medicine

## 2012-06-04 NOTE — Telephone Encounter (Signed)
A1c 250.00 

## 2012-07-02 ENCOUNTER — Other Ambulatory Visit: Payer: Self-pay | Admitting: Internal Medicine

## 2012-07-03 NOTE — Telephone Encounter (Signed)
Future order already placed

## 2012-07-27 ENCOUNTER — Telehealth: Payer: Self-pay | Admitting: Internal Medicine

## 2012-07-27 NOTE — Telephone Encounter (Signed)
Patients son called in stating that patient has been having diarrhea and was wanting to know if this was a side effect to any of her medications?

## 2012-07-29 NOTE — Telephone Encounter (Signed)
Hold Metformin & get A1c & urinr microalbumin (250.02)

## 2012-07-30 NOTE — Telephone Encounter (Signed)
Called patient's son, no answer (leaving Vm was not an option) I will try to call patient again later

## 2012-07-31 NOTE — Telephone Encounter (Signed)
Called patient's son at listed number, unable to leave message. Will try to reach patient again later

## 2012-08-01 NOTE — Telephone Encounter (Signed)
Spoke with Casimiro Needle (patient's son), verbalized understanding to STOP metformin. Patient will have A1c checked on Friday

## 2012-08-01 NOTE — Telephone Encounter (Signed)
Called home number, unable to leave a message, called cell phone and left message with female to have [name] return call when available.

## 2012-08-03 ENCOUNTER — Other Ambulatory Visit (INDEPENDENT_AMBULATORY_CARE_PROVIDER_SITE_OTHER): Payer: Medicare Other

## 2012-08-03 DIAGNOSIS — E119 Type 2 diabetes mellitus without complications: Secondary | ICD-10-CM

## 2012-08-07 ENCOUNTER — Other Ambulatory Visit: Payer: Self-pay | Admitting: Internal Medicine

## 2012-08-08 ENCOUNTER — Other Ambulatory Visit: Payer: Self-pay | Admitting: General Practice

## 2012-08-08 NOTE — Telephone Encounter (Signed)
Med filled.  

## 2012-08-15 ENCOUNTER — Telehealth: Payer: Self-pay | Admitting: Internal Medicine

## 2012-08-15 DIAGNOSIS — I10 Essential (primary) hypertension: Secondary | ICD-10-CM

## 2012-08-15 NOTE — Telephone Encounter (Signed)
Called and left a message for pt son to call to schedule an Office visit for his mom. Orders for labs placed.

## 2012-08-15 NOTE — Telephone Encounter (Signed)
Patient's son would like to know if pt needs to restart the metformin. He states she was told to stop it due to loose stool.

## 2012-08-15 NOTE — Telephone Encounter (Signed)
Before restarting metformin; she should have repeat kidney function test (BMET) in followup office visit with all meds. Codes: 250.02, 401.9.

## 2012-08-17 ENCOUNTER — Ambulatory Visit (INDEPENDENT_AMBULATORY_CARE_PROVIDER_SITE_OTHER): Payer: Medicare Other | Admitting: *Deleted

## 2012-08-17 ENCOUNTER — Telehealth: Payer: Self-pay | Admitting: Internal Medicine

## 2012-08-17 DIAGNOSIS — Z7901 Long term (current) use of anticoagulants: Secondary | ICD-10-CM

## 2012-08-17 DIAGNOSIS — D735 Infarction of spleen: Secondary | ICD-10-CM

## 2012-08-17 DIAGNOSIS — D7389 Other diseases of spleen: Secondary | ICD-10-CM

## 2012-08-17 LAB — POCT INR: INR: 2.6

## 2012-08-17 NOTE — Telephone Encounter (Signed)
Pt son came in and filled out a walk in form for her. Pt son wanted to ask a question about a request for a letter on deeming Alexias competent for 24 hrs to provide it for family lawyer to grant power of attorney to be able to handle healthcare and financial information on her behalf.  thANKS

## 2012-08-21 ENCOUNTER — Other Ambulatory Visit: Payer: Self-pay | Admitting: Internal Medicine

## 2012-08-21 NOTE — Telephone Encounter (Signed)
Patient's son is calling regarding message below. Please advise.

## 2012-08-22 ENCOUNTER — Telehealth: Payer: Self-pay | Admitting: *Deleted

## 2012-08-22 NOTE — Telephone Encounter (Signed)
Spoke with son who is requesting a letter to deem his mother mentally competent so that he can obtain a POA for her. I advised the son that according to the previous visit notes the patient is NOT competent to make decisions and he would need to come to the office and speak with the MD concerning this. Son stated that his mother should be due soon for an OV for DM mgmt. Appt made for Friday September 14, 2012 at 9am.

## 2012-09-04 ENCOUNTER — Other Ambulatory Visit: Payer: Self-pay | Admitting: Internal Medicine

## 2012-09-14 ENCOUNTER — Ambulatory Visit (INDEPENDENT_AMBULATORY_CARE_PROVIDER_SITE_OTHER): Payer: Medicare Other | Admitting: Internal Medicine

## 2012-09-14 ENCOUNTER — Telehealth: Payer: Self-pay | Admitting: Internal Medicine

## 2012-09-14 ENCOUNTER — Ambulatory Visit (INDEPENDENT_AMBULATORY_CARE_PROVIDER_SITE_OTHER): Payer: Medicare Other | Admitting: *Deleted

## 2012-09-14 VITALS — BP 128/70 | HR 95 | Resp 14

## 2012-09-14 DIAGNOSIS — Z7901 Long term (current) use of anticoagulants: Secondary | ICD-10-CM

## 2012-09-14 DIAGNOSIS — E119 Type 2 diabetes mellitus without complications: Secondary | ICD-10-CM

## 2012-09-14 DIAGNOSIS — D7389 Other diseases of spleen: Secondary | ICD-10-CM

## 2012-09-14 DIAGNOSIS — D735 Infarction of spleen: Secondary | ICD-10-CM

## 2012-09-14 DIAGNOSIS — E785 Hyperlipidemia, unspecified: Secondary | ICD-10-CM

## 2012-09-14 DIAGNOSIS — F0280 Dementia in other diseases classified elsewhere without behavioral disturbance: Secondary | ICD-10-CM

## 2012-09-14 LAB — POCT INR: INR: 3.5

## 2012-09-14 MED ORDER — INSULIN DETEMIR 100 UNIT/ML FLEXPEN: 15.0000 [IU] | PEN_INJECTOR | Freq: Every day | SUBCUTANEOUS | Status: DC

## 2012-09-14 MED ORDER — INSULIN PEN NEEDLE 32G X 6 MM MISC: Status: DC

## 2012-09-14 NOTE — Progress Notes (Signed)
  Subjective:    Patient ID: [name], female    DOB: Sep 04, 1940, 72 y.o.   MRN: [id number]  HPI In September 2013 her A1c was 6% on insulin 37 units daily and metformin 500 mg bid. The insulin was discontinued and metformin continued. She ran out of metformin 2 weeks ago. She had been having loose to watery stools while on metformin and this resolved off metformin. Her most recent A1c was 8% while on the metformin  5/2/ 2014. This would correlate with an average sugar of 183 and long-term risk of 60%.  Fasting blood sugars have been 136-160. She is not having hypoglycemia.    Review of Systems She is on pravastatin 40 mg; her triglycerides were 226 of September 2013. At that time HDL was 42 and LDL 71. Creatinine was 1.06  She is no longer taking generic Aricept; she continues Namenda. It is unclear when she last saw the neurologist.     Objective:   Physical Exam Gen.: Adequately nourished in appearance. Disengaged & non verbal throughout exam.  Eyes: No corneal or conjunctival inflammation noted.  Mouth: Oral mucosa and oropharynx reveal no lesions or exudates. Dentures in good repair. Neck: No deformities, masses, or tenderness noted.  Thyroid normal. Lungs: Normal respiratory effort; chest expands symmetrically. Lungs are clear to auscultation without rales, wheezes, or increased work of breathing. Heart: Normal rate and rhythm. Normal S1 and S2. No gallop, click, or rub. S4 w/o murmur. Abdomen: Bowel sounds normal; abdomen soft and nontender. No masses, organomegaly or hernias noted. Genitalia: Genitalia normal except for left varices. Prostate is normal without enlargement, asymmetry, nodularity, or induration.   As per Gyn                                  Musculoskeletal/extremities: Accentuated curvature of mid thoracic  Spine.  No clubbing, cyanosis, edema, or significant extremity  deformity noted. Range of motion normal .Tone & strength  Normal. Joints normal / reveal  mild  DJD DIP changes. Nail health good. Able to lie down & sit up w/o help. Negative SLR bilaterally Vascular: Carotid, radial artery, dorsalis pedis and  posterior tibial pulses are equal; but pedal pulses decreased. No bruits present. Neurologic: Flipping through magazine & not interactive.        Skin: Intact without suspicious lesions or rashes. Lymph: No cervical, axillary lymphadenopathy present. Psych: Mood and affect are normal. Normally interactive                                                                                        Assessment & Plan:

## 2012-09-14 NOTE — Patient Instructions (Addendum)
Please review the medication list in the After Visit Summary provided.Please verify the medication name (this may be  brand or generic) & correct dosage. Write the name of the prescribing physician to the right of the medication and share this with all medical staff seen at each appointment. This will help provide continuity of care; help optimize therapeutic interventions;and help prevent drug:drug adverse reaction.  Verify the average fasting blood sugar each week based on 7 daily measurements. Increase the daily dose of insulin by 2 units each day every Monday if the fasting blood sugars are  averaging over 200. Continue this titration by 2 units daily each week  up to a max of 35 units. At that time an A1c will be rechecked to determine optimal therapy.   Please  schedule fasting Labs in 12 -14 weeks after medication changes: BMET,Lipids, hepatic panel, TSH, A1c. PLEASE BRING THESE INSTRUCTIONS TO FOLLOW UP  LAB APPOINTMENT.This will guarantee correct labs are drawn, eliminating need for repeat blood sampling ( needle sticks ! ). Diagnoses /Codes: 250.00, 272.4,995.20.

## 2012-09-14 NOTE — Assessment & Plan Note (Signed)
The most important goal is to avoid hypoglycemia. An A1c of 8% isn't adequate goal; but she's been having bowel issues with the metformin. It is unclear whether this is a sustained release form.  Basal insulin may be the easiest way to keep the A1c at goal with minimal risk. It will be reinitiated at 15 units daily at the same time. Guidelines discussed.A1c after 3 months

## 2012-09-14 NOTE — Assessment & Plan Note (Signed)
Triglycerides have been elevated suggesting suboptimal nutritional but LDL has been at goal. Lipids should be checked in October.

## 2012-09-14 NOTE — Telephone Encounter (Signed)
Patient seen by Dr. Alwyn Ren today, 09/14/12.  Patient's son Casimiro Needle calling, and would like to stay within [place], and is requesting a referral to patient can get an appointment as soon as possible.

## 2012-09-16 ENCOUNTER — Other Ambulatory Visit: Payer: Self-pay | Admitting: Internal Medicine

## 2012-09-16 ENCOUNTER — Encounter: Payer: Self-pay | Admitting: Internal Medicine

## 2012-09-18 NOTE — Telephone Encounter (Signed)
Per Dr.Hopper ok to place referral for neurology

## 2012-09-18 NOTE — Telephone Encounter (Signed)
She should see Neurologist whose name is listed on the Namenda; the letter addresses dementia issue & POA

## 2012-09-18 NOTE — Telephone Encounter (Signed)
No Neurology referral has been entered yet, only a letter to neurology.  Will you please enter?

## 2012-09-20 ENCOUNTER — Telehealth: Payer: Self-pay | Admitting: Internal Medicine

## 2012-09-20 NOTE — Telephone Encounter (Signed)
In relation to phone note 09/14/12, I have spoken with patient's son, Casimiro Needle, informed him Dr. Alwyn Ren advised patient to see Neurologist whose name is listed on the Namenda.  Casimiro Needle checked the prescription bottle, and it has Dr. Frederik Pear name on it.  He has no idea who patient prior to now for neurology.  I have called several neurology practices trying to help, but no luck.  Please advise.

## 2012-09-21 NOTE — Telephone Encounter (Signed)
Is the old paper chart available? As she ismarkedly improved under the care of him & his wife ; I'll continue to refill Namenda.

## 2012-09-24 NOTE — Telephone Encounter (Signed)
I found in patient's paper chart that she is a prior patient of Dr. Deneen Harts at [facility].  He is no longer there, but their medical records department still have her records on site.  I have re-referred her to [facility], they will contact her son, Casimiro Needle to schedule.  I will notify Casimiro Needle by phone.

## 2012-10-02 ENCOUNTER — Other Ambulatory Visit: Payer: Self-pay | Admitting: Internal Medicine

## 2012-10-02 NOTE — Telephone Encounter (Signed)
Refill done per protocol./Chrae [facility] CMA.

## 2012-10-04 ENCOUNTER — Ambulatory Visit (INDEPENDENT_AMBULATORY_CARE_PROVIDER_SITE_OTHER): Payer: Medicare Other | Admitting: *Deleted

## 2012-10-04 DIAGNOSIS — I498 Other specified cardiac arrhythmias: Secondary | ICD-10-CM

## 2012-10-04 LAB — PACEMAKER DEVICE OBSERVATION
BAMS-0001: 150 {beats}/min
BAMS-0003: 60 {beats}/min
BATTERY VOLTAGE: 2.8 V
BRDY-0002RV: 50 {beats}/min
BRDY-0003RV: 90 {beats}/min
RV LEAD AMPLITUDE: 3 mv
RV LEAD THRESHOLD: 1.75 V

## 2012-10-04 NOTE — Progress Notes (Signed)
PPM check in office. 

## 2012-10-09 ENCOUNTER — Ambulatory Visit (INDEPENDENT_AMBULATORY_CARE_PROVIDER_SITE_OTHER): Payer: Medicare Other | Admitting: *Deleted

## 2012-10-09 DIAGNOSIS — D735 Infarction of spleen: Secondary | ICD-10-CM

## 2012-10-09 DIAGNOSIS — Z7901 Long term (current) use of anticoagulants: Secondary | ICD-10-CM

## 2012-10-09 DIAGNOSIS — D7389 Other diseases of spleen: Secondary | ICD-10-CM

## 2012-10-09 LAB — POCT INR: INR: 3.2

## 2012-10-19 ENCOUNTER — Ambulatory Visit (INDEPENDENT_AMBULATORY_CARE_PROVIDER_SITE_OTHER): Payer: Medicare Other | Admitting: Pharmacist

## 2012-10-19 DIAGNOSIS — D735 Infarction of spleen: Secondary | ICD-10-CM

## 2012-10-19 DIAGNOSIS — D7389 Other diseases of spleen: Secondary | ICD-10-CM

## 2012-10-19 DIAGNOSIS — Z7901 Long term (current) use of anticoagulants: Secondary | ICD-10-CM

## 2012-10-19 LAB — POCT INR: INR: 2.7

## 2012-10-25 ENCOUNTER — Encounter: Payer: Self-pay | Admitting: Internal Medicine

## 2012-10-30 ENCOUNTER — Ambulatory Visit (INDEPENDENT_AMBULATORY_CARE_PROVIDER_SITE_OTHER): Payer: Medicare Other | Admitting: Neurology

## 2012-10-30 ENCOUNTER — Encounter: Payer: Self-pay | Admitting: Neurology

## 2012-10-30 VITALS — BP 145/80 | HR 55 | Ht 67.0 in | Wt 170.0 lb

## 2012-10-30 DIAGNOSIS — G309 Alzheimer's disease, unspecified: Secondary | ICD-10-CM

## 2012-10-30 DIAGNOSIS — F0280 Dementia in other diseases classified elsewhere without behavioral disturbance: Secondary | ICD-10-CM

## 2012-10-30 NOTE — Progress Notes (Signed)
Reason for visit: Alzheimer's disease  [name] is a 72 y.o. female  History of present illness:  [name] is a 72 year old right-handed black female with a history of a progressive dementia that spans over 10 years or more. The patient was seen through this office in 2010. The patient was on Namenda and Aricept at that time. The patient has continued to progress, and she still lives with Kristy family. The patient requires supervision 24 hours a day. The patient requires assistance with most activities of daily living such as bathing, dressing, and occasionally she needs some help with feeding himself. The patient is not always able to tell when she needs use the bathroom, and she may have some incontinence issues. The patient occasionally will wander out of the house. The patient has decreased verbal output, generally she does not speak unless spoken to. The patient will sleep up to 12 hours a day. The patient has not had significant problems with agitation. The patient does not recognize family members with exception of [name]. The patient can state Kristy name. The patient has not demonstrated any dangerous activities or behaviors. The patient comes to this office for an evaluation. She remains on Namenda at this time.  Past Medical History  Diagnosis Date  . Dysphagia   . Diabetes mellitus   . Alzheimer disease   . Hypokalemia   . Anemia   . 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 DVT (deep venous thrombosis)     [facility]  . Hypertension     Past Surgical History  Procedure Laterality Date  . Syncope  05/2001    pacer  . Pacemaker placement      Family History  Problem Relation Age of Onset  . Alzheimer's disease    . Diabetes    . Stroke Father   . Diabetes Brother   . Seizures Brother   . Diabetes Brother     Social history:  reports that she has never smoked. She does not have any smokeless tobacco history on file. She reports that she does not drink  alcohol or use illicit drugs.  Medications:  Current Outpatient Prescriptions on File Prior to Visit  Medication Sig Dispense Refill  . DIOVAN 320 MG tablet TAKE 1 TABLET BY MOUTH DAILY  30 tablet  5  . Insulin Pen Needle (NOVOFINE) 32G X 6 MM MISC DX: 250.02, Use once daily with Levimir Injection  100 each  3  . NAMENDA 10 MG tablet TAKE 1 TABLET BY MOUTH TWICE A DAY  60 tablet  5  . potassium chloride SA (KLOR-CON M20) 20 MEQ tablet TAKE 1 TABLET BY MOUTH TWICE A DAY  60 tablet  5  . pravastatin (PRAVACHOL) 40 MG tablet TAKE 1 TABLET BY MOUTH EVERY DAY  90 tablet  0  . warfarin (COUMADIN) 5 MG tablet TAKE AS DIRECTED BY ANTICOAGULATION CLINIC  40 tablet  3   No current facility-administered medications on file prior to visit.    Allergies:  Allergies  Allergen Reactions  . Ace Inhibitors     REACTION: cough    ROS:  Out of a complete 14 system review of symptoms, the patient complains only of the following symptoms, and all other reviewed systems are negative.  Memory loss, confusion Too much sleep  Blood pressure 145/80, pulse 55, height 5\' 7"  (1.702 m), weight 170 lb (77.111 kg).  Physical Exam  The patient is alert and cooperative, minimally verbal. The patient is  minimally obese.  Head: Pupils are equal, round, and reactive to light. Discs are flat bilaterally.  Neck: The neck is supple, no carotid bruits are noted.  Respiratory: The respiratory examination is clear.  Cardiovascular: The cardiovascular examination reveals a regular rate and rhythm, no obvious murmurs or rubs are noted.  Skin: Extremities are without significant edema.  Neurologic Exam  Mental status:  Cranial nerves: Facial symmetry is present. There is good sensation of the face to pinprick and soft touch bilaterally. The strength of the facial muscles and the muscles to head turning and shoulder shrug are normal bilaterally. Speech is well enunciated, no aphasia or dysarthria is noted.  Extraocular movements are full. Visual fields are full to threat..  Motor: The motor testing reveals 5 over 5 strength of all 4 extremities. Good symmetric motor tone is noted throughout.  Sensory: Sensory testing is intact to pinprick, soft touch, vibration sensation on all 4 extremities. No evidence of extinction is noted.  Coordination: Cerebellar testing reveals good finger-nose-finger and heel-to-shin bilaterally. Some apraxia with the use of the lower extremities is noted.  Gait and station: Gait is normal. Tandem gait was not attempted. Romberg is negative. No drift is seen.  Reflexes: Deep tendon reflexes are symmetric and normal bilaterally. Toes are downgoing bilaterally.   Assessment/Plan:  One. Alzheimer's disease, end-stage  The patient has severe dementia at this point. The patient will continue on Namenda, as it does have some benefit with behavior. I see no indication for starting Aricept again at this point. The family needs a letter regarding Kristy cognitive condition for guardianship purposes.  Marlan Palau MD 10/30/2012 6:56 PM  [facility] [address] [place], [place] [address]  Phone [phone] Fax [phone]

## 2012-11-01 ENCOUNTER — Telehealth: Payer: Self-pay | Admitting: Internal Medicine

## 2012-11-01 NOTE — Telephone Encounter (Signed)
Patient's son, Latice Waitman, is calling and states that he needs a reference letter stating that the patient has good appearance and that he has been taking good caretaker for her. This letter is for himself, and he would not tell me what exactly the letter was for. Says he will come pick up when ready.

## 2012-11-02 NOTE — Telephone Encounter (Signed)
Hopp please advise on patient's son request

## 2012-11-03 ENCOUNTER — Other Ambulatory Visit: Payer: Self-pay | Admitting: Internal Medicine

## 2012-11-03 ENCOUNTER — Encounter: Payer: Self-pay | Admitting: Internal Medicine

## 2012-11-09 ENCOUNTER — Ambulatory Visit (INDEPENDENT_AMBULATORY_CARE_PROVIDER_SITE_OTHER): Payer: Medicare Other

## 2012-11-09 DIAGNOSIS — D7389 Other diseases of spleen: Secondary | ICD-10-CM

## 2012-11-09 DIAGNOSIS — D735 Infarction of spleen: Secondary | ICD-10-CM

## 2012-11-09 DIAGNOSIS — Z7901 Long term (current) use of anticoagulants: Secondary | ICD-10-CM

## 2012-11-09 LAB — POCT INR: INR: 2.2

## 2013-01-15 ENCOUNTER — Telehealth: Payer: Self-pay | Admitting: Internal Medicine

## 2013-01-15 ENCOUNTER — Ambulatory Visit: Payer: Medicare Other | Admitting: Internal Medicine

## 2013-01-15 DIAGNOSIS — Z0289 Encounter for other administrative examinations: Secondary | ICD-10-CM

## 2013-01-15 MED ORDER — GLUCOSE BLOOD VI STRP: ORAL_STRIP | Status: DC

## 2013-01-15 NOTE — Telephone Encounter (Signed)
Freestyle Lite test strips refill sent to pharmacy

## 2013-01-15 NOTE — Telephone Encounter (Signed)
Patient called and requested a refill on her freestyle light strips to test her blood sugar. Thanks     Pharmacy [facility] [address]

## 2013-01-16 ENCOUNTER — Ambulatory Visit (INDEPENDENT_AMBULATORY_CARE_PROVIDER_SITE_OTHER): Payer: Medicare Other | Admitting: *Deleted

## 2013-01-16 DIAGNOSIS — Z7901 Long term (current) use of anticoagulants: Secondary | ICD-10-CM

## 2013-01-16 DIAGNOSIS — D7389 Other diseases of spleen: Secondary | ICD-10-CM

## 2013-01-16 DIAGNOSIS — D735 Infarction of spleen: Secondary | ICD-10-CM

## 2013-01-16 LAB — POCT INR: INR: 1.6

## 2013-01-18 ENCOUNTER — Encounter: Payer: Self-pay | Admitting: Internal Medicine

## 2013-01-18 ENCOUNTER — Ambulatory Visit (INDEPENDENT_AMBULATORY_CARE_PROVIDER_SITE_OTHER): Payer: Medicare Other | Admitting: Internal Medicine

## 2013-01-18 VITALS — BP 149/77 | HR 55 | Temp 96.8°F | Wt 172.0 lb

## 2013-01-18 DIAGNOSIS — I10 Essential (primary) hypertension: Secondary | ICD-10-CM

## 2013-01-18 DIAGNOSIS — F0280 Dementia in other diseases classified elsewhere without behavioral disturbance: Secondary | ICD-10-CM

## 2013-01-18 DIAGNOSIS — IMO0001 Reserved for inherently not codable concepts without codable children: Secondary | ICD-10-CM

## 2013-01-18 DIAGNOSIS — E785 Hyperlipidemia, unspecified: Secondary | ICD-10-CM

## 2013-01-18 LAB — BASIC METABOLIC PANEL
BUN: 15 mg/dL (ref 6–23)
Calcium: 8.9 mg/dL (ref 8.4–10.5)
Chloride: 101 mEq/L (ref 96–112)
Creatinine, Ser: 1.1 mg/dL (ref 0.4–1.2)

## 2013-01-18 LAB — LIPID PANEL
Cholesterol: 200 mg/dL (ref 0–200)
HDL: 51.9 mg/dL (ref >39.00)
Total CHOL/HDL Ratio: 4
Triglycerides: 225 mg/dL — ABNORMAL HIGH (ref 0.0–149.0)

## 2013-01-18 LAB — HEPATIC FUNCTION PANEL
ALT: 14 U/L (ref 0–35)
Bilirubin, Direct: 0.1 mg/dL (ref 0.0–0.3)
Total Bilirubin: 0.5 mg/dL (ref 0.3–1.2)
Total Protein: 7.5 g/dL (ref 6.0–8.3)

## 2013-01-18 LAB — LDL CHOLESTEROL, DIRECT: Direct LDL: 111.9 mg/dL

## 2013-01-18 LAB — TSH: TSH: 0.53 u[IU]/mL (ref 0.35–5.50)

## 2013-01-18 NOTE — Progress Notes (Signed)
  Subjective:    Patient ID: [name], female    DOB: 1940/09/19, 72 y.o.   MRN: [id number]  HPI  Fasting blood sugars have ranged from 70-150. The highest glucose 2 hours after meal was 303. She is eating approximately 3 meals a day on average. She's had no hypoglycemic episodes. She is not on an exercise program.  She has seen the ophthalmologist who diagnosed cataracts. He is hesitant to do the laser surgery because of Alzheimer's.  Blood pressure  not  monitored at home.  Her last A1c was 8% on 08/03/12. Last lipids on record were 12/16/11. Triglycerides were 226; this represents a significant improvement from her prior value of 371. HDL was mildly reduced at 42. LDL was excellent at 71 renal function was normal at that time. TSH was therapeutic.     Review of Systems  Her warfarin is managed through the [facility]; it was increased slightly due to a PT/INR 1.6 on 10/15.  Dr. Anne Hahn has recommended continuing Namenda but not the generic Aricept. His last office visit 10/30/12 was reviewed.     Objective:   Physical Exam Gen.:  well-nourished in appearance. Not communicable but  cooperative throughout exam.  Eyes: No corneal or conjunctival inflammation noted.  Mouth: Oral mucosa and oropharynx reveal no lesions or exudates. Dentures in good repair. Neck: No deformities, masses, or tenderness noted. Thyroid normal. Lungs: Normal respiratory effort; chest expands symmetrically. Lungs are clear to auscultation without rales, wheezes, or increased work of breathing. Heart: Slow rate and regular rhythm. Normal S1 and S2. No gallop, click, or rub. No  murmur. Abdomen: Bowel sounds normal; abdomen soft and nontender. No masses, organomegaly or hernias noted.                               Musculoskeletal/extremities:   Accentuated curvature of  thoracic spine. No clubbing, cyanosis, edema, or significant extremity  deformity noted. Range of motion normal .Tone increased. Joints  normal . Nail health good. Able to lie down & sit up with some help. Negative SLR bilaterally Vascular: Carotid, radial artery, dorsalis pedis and  posterior tibial pulses are full and equal. No bruits present. Neurologic: Disoriented x3. Deep tendon reflexes symmetrical and normal.         Skin: Intact without suspicious lesions or rashes. Lymph: No cervical, axillary lymphadenopathy present. Psych: Mood and affect are flat                                                                                      Assessment & Plan:  See Current Assessment & Plan in Problem List under specific Diagnosis

## 2013-01-18 NOTE — Assessment & Plan Note (Signed)
BMET BP goals discussed 

## 2013-01-18 NOTE — Assessment & Plan Note (Signed)
A1c BMET 

## 2013-01-18 NOTE — Patient Instructions (Signed)
Check glucose once daily if possible Fasting or morning glucose recommended M, W, F, & Sun if possible. Goal= 100-150 Glucose 2 hours after breakfast Tues, after lunch Thurs & 2 hrs after eve meal Sat if possible. Goal = < 180 Minimal Blood Pressure Goal= AVERAGE < 140/90;  Ideal is an AVERAGE < 135/85. This AVERAGE should be calculated from @ least 5-7 BP readings taken @ different times of day on different days of week. You should not respond to isolated BP readings , but rather the AVERAGE for that week .Please bring your  blood pressure cuff to office visits to verify that it is reliable.It  can also be checked against the blood pressure device at the pharmacy. Finger or wrist cuffs are not dependable; an arm cuff is. 

## 2013-01-18 NOTE — Assessment & Plan Note (Signed)
D/C generic Aricept

## 2013-01-18 NOTE — Assessment & Plan Note (Signed)
fasting Labs : BMET,Lipids, hepatic panel,TSH 

## 2013-01-20 ENCOUNTER — Other Ambulatory Visit: Payer: Self-pay | Admitting: Internal Medicine

## 2013-01-20 DIAGNOSIS — IMO0001 Reserved for inherently not codable concepts without codable children: Secondary | ICD-10-CM

## 2013-01-21 ENCOUNTER — Encounter: Payer: Self-pay | Admitting: *Deleted

## 2013-01-21 ENCOUNTER — Telehealth: Payer: Self-pay | Admitting: *Deleted

## 2013-01-21 NOTE — Telephone Encounter (Signed)
Message copied by Baldwin [place] on Mon Jan 21, 2013  8:47 AM ------      Message from: Pecola Lawless      Created: Sun Jan 20, 2013 12:46 PM       A1c assesses average 24 hour  glucose over prior 6-12 weeks. A1c GOALS:      No Diabetes risk if < 6.1%       "Pre Diabetes" :6.2-6.4 %      Good diabetic control: 6.5-7 %      Fair diabetic control: 7-8 %      Poor diabetic control: greater than 8 % ( except with additional factors such as  advanced age; significant coronary or neurologic disease,etc).       Mrs. Hally's A1c goal is 8%.      Goals for home glucose monitoring are : fasting  or morning glucose goal of  100-150. Two hours after any meal , goal = < 180, preferably < 160.      Report any low blood glucoses immediately.      Average the fasting sugars each week; increase the daily insulin  dose each week  by 2 units (Ex 21 to 23 to 25 etc ) until the average fasting blood sugar for the week  is 140-160.Repeat the A1c in 3-4 months .      Please bring your diary of glucose & blood pressure readings & all actual pill bottles to all appts.                     ------

## 2013-01-21 NOTE — Telephone Encounter (Signed)
Spoke with pts son made aware of lab results and Dr. Frederik Pear recommendations of increasing basal insulin. Letter also sent with instructions.

## 2013-01-21 NOTE — Progress Notes (Signed)
Letter sent to patient with results.

## 2013-01-30 ENCOUNTER — Ambulatory Visit (INDEPENDENT_AMBULATORY_CARE_PROVIDER_SITE_OTHER): Payer: Medicare Other | Admitting: Pharmacist

## 2013-01-30 DIAGNOSIS — Z7901 Long term (current) use of anticoagulants: Secondary | ICD-10-CM

## 2013-01-30 DIAGNOSIS — D7389 Other diseases of spleen: Secondary | ICD-10-CM

## 2013-01-30 DIAGNOSIS — D735 Infarction of spleen: Secondary | ICD-10-CM

## 2013-02-22 ENCOUNTER — Ambulatory Visit (INDEPENDENT_AMBULATORY_CARE_PROVIDER_SITE_OTHER): Payer: Medicare Other | Admitting: General Practice

## 2013-02-22 ENCOUNTER — Encounter (INDEPENDENT_AMBULATORY_CARE_PROVIDER_SITE_OTHER): Payer: Self-pay

## 2013-02-22 DIAGNOSIS — D7389 Other diseases of spleen: Secondary | ICD-10-CM

## 2013-02-22 DIAGNOSIS — Z7901 Long term (current) use of anticoagulants: Secondary | ICD-10-CM

## 2013-02-22 DIAGNOSIS — D735 Infarction of spleen: Secondary | ICD-10-CM

## 2013-02-24 ENCOUNTER — Other Ambulatory Visit: Payer: Self-pay | Admitting: Internal Medicine

## 2013-03-04 ENCOUNTER — Other Ambulatory Visit: Payer: Self-pay | Admitting: Internal Medicine

## 2013-03-05 NOTE — Telephone Encounter (Signed)
Valsartan, Namenda and Diovan refilled per protocol.

## 2013-04-03 ENCOUNTER — Emergency Department (HOSPITAL_COMMUNITY)
Admission: EM | Admit: 2013-04-03 | Discharge: 2013-04-03 | Disposition: A | Discharge status: Home / Self Care | Payer: Medicare Other | Attending: Emergency Medicine | Admitting: Emergency Medicine

## 2013-04-03 ENCOUNTER — Other Ambulatory Visit: Payer: Self-pay | Admitting: Internal Medicine

## 2013-04-03 ENCOUNTER — Encounter (HOSPITAL_COMMUNITY): Payer: Self-pay | Admitting: Emergency Medicine

## 2013-04-03 ENCOUNTER — Emergency Department (HOSPITAL_COMMUNITY): Payer: Medicare Other

## 2013-04-03 DIAGNOSIS — Z86718 Personal history of other venous thrombosis and embolism: Secondary | ICD-10-CM | POA: Insufficient documentation

## 2013-04-03 DIAGNOSIS — Z794 Long term (current) use of insulin: Secondary | ICD-10-CM | POA: Insufficient documentation

## 2013-04-03 DIAGNOSIS — F0280 Dementia in other diseases classified elsewhere without behavioral disturbance: Secondary | ICD-10-CM | POA: Insufficient documentation

## 2013-04-03 DIAGNOSIS — E876 Hypokalemia: Secondary | ICD-10-CM | POA: Insufficient documentation

## 2013-04-03 DIAGNOSIS — E119 Type 2 diabetes mellitus without complications: Secondary | ICD-10-CM | POA: Insufficient documentation

## 2013-04-03 DIAGNOSIS — R55 Syncope and collapse: Secondary | ICD-10-CM | POA: Insufficient documentation

## 2013-04-03 DIAGNOSIS — Z79899 Other long term (current) drug therapy: Secondary | ICD-10-CM | POA: Insufficient documentation

## 2013-04-03 DIAGNOSIS — Z862 Personal history of diseases of the blood and blood-forming organs and certain disorders involving the immune mechanism: Secondary | ICD-10-CM | POA: Insufficient documentation

## 2013-04-03 DIAGNOSIS — Z7901 Long term (current) use of anticoagulants: Secondary | ICD-10-CM | POA: Insufficient documentation

## 2013-04-03 DIAGNOSIS — Z950 Presence of cardiac pacemaker: Secondary | ICD-10-CM | POA: Insufficient documentation

## 2013-04-03 DIAGNOSIS — G309 Alzheimer's disease, unspecified: Secondary | ICD-10-CM | POA: Insufficient documentation

## 2013-04-03 DIAGNOSIS — I10 Essential (primary) hypertension: Secondary | ICD-10-CM | POA: Insufficient documentation

## 2013-04-03 LAB — CBC WITH DIFFERENTIAL/PLATELET
Basophils Absolute: 0 10*3/uL (ref 0.0–0.1)
Basophils Relative: 0 % (ref 0–1)
Eosinophils Absolute: 0.1 10*3/uL (ref 0.0–0.7)
Eosinophils Relative: 1 % (ref 0–5)
MCH: 28.8 pg (ref 26.0–34.0)
MCV: 88.3 fL (ref 78.0–100.0)
Neutro Abs: 6.9 10*3/uL (ref 1.7–7.7)
Neutrophils Relative %: 75 % (ref 43–77)
Platelets: 188 10*3/uL (ref 150–400)
RBC: 4.45 MIL/uL (ref 3.87–5.11)
RDW: 12.9 % (ref 11.5–15.5)
WBC: 9.1 10*3/uL (ref 4.0–10.5)

## 2013-04-03 LAB — BASIC METABOLIC PANEL
CO2: 25 mEq/L (ref 19–32)
Calcium: 9.5 mg/dL (ref 8.4–10.5)
Creatinine, Ser: 1 mg/dL (ref 0.50–1.10)
GFR calc non Af Amer: 55 mL/min — ABNORMAL LOW (ref >90)
Glucose, Bld: 271 mg/dL — ABNORMAL HIGH (ref 70–99)
Sodium: 138 mEq/L (ref 137–147)

## 2013-04-03 LAB — URINALYSIS, ROUTINE W REFLEX MICROSCOPIC
Glucose, UA: 100 mg/dL — AB
Ketones, ur: NEGATIVE mg/dL
Leukocytes, UA: NEGATIVE
Protein, ur: NEGATIVE mg/dL
Urobilinogen, UA: 0.2 mg/dL (ref 0.0–1.0)
pH: 5.5 (ref 5.0–8.0)

## 2013-04-03 LAB — POCT I-STAT TROPONIN I: Troponin i, poc: 0 ng/mL (ref 0.00–0.08)

## 2013-04-03 LAB — PROTIME-INR
INR: 1.83 — ABNORMAL HIGH (ref 0.00–1.49)
Prothrombin Time: 20.6 seconds — ABNORMAL HIGH (ref 11.6–15.2)

## 2013-04-03 MED ORDER — INSULIN ASPART 100 UNIT/ML ~~LOC~~ SOLN: 10.0000 [IU] | Freq: Once | SUBCUTANEOUS | Status: DC

## 2013-04-03 MED ORDER — SODIUM CHLORIDE 0.9 % IV BOLUS (SEPSIS)
1000.0000 mL | Freq: Once | INTRAVENOUS | Status: AC
  Administered 2013-04-03: 1000 mL via INTRAVENOUS

## 2013-04-03 NOTE — ED Provider Notes (Signed)
CSN: [id number]     Arrival date & time 04/03/13  1256 History   First MD Initiated Contact with Patient 04/03/13 1309     Chief Complaint  Patient presents with  . Loss of Consciousness   (Consider location/radiation/quality/duration/timing/severity/associated sxs/prior Treatment) HPI Comments: Patient is a 72 year old female with a past medical history of diabetes, alzheimer's, and hypertension who presents after 2 syncopal episodes that occurred prior to arrival. Patient was at home with her son and daughter and law who provide the history. Patient was getting up from a seated position when she suddenly "seemd wobbly" so she sat back down on the bed. When she tried to get back up again, she "went limp and her eyed rolled back in her head." Patient had a syncopal episode and was unconscious for about 3 seconds before patient "woke up." The same episode happened when patient tried to get up from a seated position. Patient's family became concerned and brought her to the ED. No associated symptoms. No abdominal pain, chest pain, SOB, fever. No aggravating/alleviating factors.    Past Medical History  Diagnosis Date  . Dysphagia   . Diabetes mellitus   . Alzheimer disease   . Hypokalemia   . Anemia   . 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 DVT (deep venous thrombosis)     [facility]  . Hypertension    Past Surgical History  Procedure Laterality Date  . Syncope  05/2001    pacer  . Pacemaker placement     Family History  Problem Relation Age of Onset  . Alzheimer's disease    . Diabetes    . Stroke Father   . Diabetes Brother   . Seizures Brother   . Diabetes Brother    History  Substance Use Topics  . Smoking status: Never Smoker   . Smokeless tobacco: Not on file     Comment: Patient exposed to second hand smoke daily   . Alcohol Use: No   OB History   Grav Para Term Preterm Abortions TAB SAB Ect Mult Living                 Review of Systems  Constitutional:  Negative for fever, chills and fatigue.  HENT: Negative for trouble swallowing.   Eyes: Negative for visual disturbance.  Respiratory: Negative for shortness of breath.   Cardiovascular: Negative for chest pain and palpitations.  Gastrointestinal: Negative for nausea, vomiting, abdominal pain and diarrhea.  Genitourinary: Negative for dysuria and difficulty urinating.  Musculoskeletal: Negative for arthralgias and neck pain.  Skin: Negative for color change.  Neurological: Positive for syncope. Negative for dizziness and weakness.  Psychiatric/Behavioral: Negative for dysphoric mood.    Allergies  Ace inhibitors  Home Medications   Current Outpatient Rx  Name  Route  Sig  Dispense  Refill  . Insulin Detemir 100 UNIT/ML SOPN   Subcutaneous   Inject 19 Units into the skin daily.         . memantine (NAMENDA) 10 MG tablet   Oral   Take 10 mg by mouth 2 (two) times daily.         . potassium chloride SA (K-DUR,KLOR-CON) 20 MEQ tablet   Oral   Take 20 mEq by mouth 2 (two) times daily.         . pravastatin (PRAVACHOL) 40 MG tablet   Oral   Take 40 mg by mouth daily.         . valsartan (DIOVAN)  320 MG tablet   Oral   Take 320 mg by mouth daily.         Marland Kitchen warfarin (COUMADIN) 5 MG tablet   Oral   Take 5-7.5 mg by mouth daily. Take 7.5mg  on Saturday. Take 5mg  the rest of the week.         Marland Kitchen glucose blood (FREESTYLE LITE) test strip      Test blood sugar before each meal and at bedtime   100 each   12     Diagnosis Code: 250.00   . Insulin Pen Needle (NOVOFINE) 32G X 6 MM MISC      DX: 250.02, Use once daily with Levimir Injection   100 each   3    BP 120/82  Pulse 56  Temp(Src) 97.3 F (36.3 C) (Oral)  Resp 16  SpO2 96% Physical Exam  Nursing note and vitals reviewed. Constitutional: She appears well-developed and well-nourished. No distress.  HENT:  Head: Normocephalic and atraumatic.  Eyes: Conjunctivae and EOM are normal.  Neck: Normal  range of motion.  Cardiovascular: Normal rate and regular rhythm.  Exam reveals no gallop and no friction rub.   No murmur heard. Pulmonary/Chest: Effort normal and breath sounds normal. She has no wheezes. She has no rales. She exhibits no tenderness.  Abdominal: Soft. She exhibits no distension. There is no tenderness. There is no rebound and no guarding.  Musculoskeletal: Normal range of motion.  Neurological: She is alert. Coordination normal.  Patient responds to commands. Extremity strength and sensation equal and intact bilaterally. Moves limbs without ataxia.   Skin: Skin is warm and dry.  Psychiatric: Her behavior is normal.    ED Course  Procedures (including critical care time) Labs Review Labs Reviewed  BASIC METABOLIC PANEL - Abnormal; Notable for the following:    Glucose, Bld 271 (*)    GFR calc non Af Amer 55 (*)    GFR calc Af Amer 64 (*)    All other components within normal limits  URINALYSIS, ROUTINE W REFLEX MICROSCOPIC - Abnormal; Notable for the following:    Glucose, UA 100 (*)    All other components within normal limits  PROTIME-INR - Abnormal; Notable for the following:    Prothrombin Time 20.6 (*)    INR 1.83 (*)    All other components within normal limits  URINE CULTURE  CBC WITH DIFFERENTIAL  POCT I-STAT TROPONIN I   Imaging Review Dg Chest 2 View  04/03/2013   CLINICAL DATA:  Syncope  EXAM: CHEST  2 VIEW  COMPARISON:  05/28/2007  FINDINGS: Borderline cardiomegaly. Dual lead cardiac pacemaker is unchanged in position. No acute infiltrate or pulmonary edema. Osteopenia and mild degenerative changes thoracic spine.  IMPRESSION: No active cardiopulmonary disease.   Electronically Signed   By: Natasha Mead M.D.   On: 04/03/2013 13:55    EKG Interpretation    Date/Time:  Wednesday April 03 2013 13:13:37 EST Ventricular Rate:  56 PR Interval:  203 QRS Duration: 111 QT Interval:  436 QTC Calculation: 421 R Axis:   5 Text Interpretation:  Sinus  rhythm Abnormal T, consider ischemia, diffuse leads Confirmed by DELOS  MD, DOUGLAS ([id number]) on 04/03/2013 2:35:02 PM            MDM   1. Syncopal episodes     1:43 PM Labs, chest xray, EKG and urinalysis pending. Vitals stable and patient afebrile. Patient is orthostatic. Patient will have IV fluids to improve volume depletion.  4:20 PM  Patient's labs, urinalysis and chest xray unremarkable. Negative troponin. Patient is hyperglycemia without anion gap. Patient will be discharged after IV hydration. Patient will follow up with PCP. Patient will return to the ED with worsening or concerning symptoms. Patient doing better after IV hydration.    Emilia Beck, PA-C 04/03/13 1630

## 2013-04-03 NOTE — ED Notes (Signed)
Per ems: pt from home. Husband states pt was bending over to get something from floor and "seemed wobbly" so he sat her in the bed. Then she got up to walk to kitchen with daughter in law and daughter in law states the pt "went limp and eyes rolled back in head." pt did not fall or get hurt and shortly after became alert to her normal level. Pt has alzheimer's and normally nonverbal. Pt denies any complaints. Pt has pacemaker, PR was brady around 50 en route. cbg 211, bp 108/64, sats 99%RA en route.

## 2013-04-04 LAB — URINE CULTURE
Colony Count: NO GROWTH
Culture: NO GROWTH
Special Requests: NORMAL

## 2013-04-04 NOTE — ED Provider Notes (Signed)
Medical screening examination/treatment/procedure(s) were performed by non-physician practitioner and as supervising physician I was immediately available for consultation/collaboration.  EKG Interpretation    Date/Time:  Wednesday April 03 2013 13:13:37 EST Ventricular Rate:  56 PR Interval:  203 QRS Duration: 111 QT Interval:  436 QTC Calculation: 421 R Axis:   5 Text Interpretation:  Sinus rhythm Abnormal T, consider ischemia, diffuse leads Confirmed by Beau Fanny  MD, Roxanne Panek ([id number]) on 04/03/2013 2:35:02 PM             Veryl Speak, MD 04/04/13 [phone]

## 2013-04-05 NOTE — Telephone Encounter (Signed)
Pravastatin refilled per protocol. JG//CMA 

## 2013-04-20 ENCOUNTER — Emergency Department (HOSPITAL_COMMUNITY): Payer: Medicare Other

## 2013-04-20 ENCOUNTER — Inpatient Hospital Stay (HOSPITAL_COMMUNITY)
Admission: EM | Admit: 2013-04-20 | Discharge: 2013-04-23 | DRG: 310 | Disposition: A | Discharge status: Home / Self Care | Payer: Medicare Other | Attending: Cardiology | Admitting: Cardiology

## 2013-04-20 ENCOUNTER — Encounter (HOSPITAL_COMMUNITY): Payer: Self-pay | Admitting: Emergency Medicine

## 2013-04-20 DIAGNOSIS — Z86718 Personal history of other venous thrombosis and embolism: Secondary | ICD-10-CM

## 2013-04-20 DIAGNOSIS — F0280 Dementia in other diseases classified elsewhere without behavioral disturbance: Secondary | ICD-10-CM | POA: Diagnosis present

## 2013-04-20 DIAGNOSIS — R55 Syncope and collapse: Secondary | ICD-10-CM

## 2013-04-20 DIAGNOSIS — Z950 Presence of cardiac pacemaker: Secondary | ICD-10-CM

## 2013-04-20 DIAGNOSIS — Z7901 Long term (current) use of anticoagulants: Secondary | ICD-10-CM

## 2013-04-20 DIAGNOSIS — G309 Alzheimer's disease, unspecified: Secondary | ICD-10-CM | POA: Diagnosis present

## 2013-04-20 DIAGNOSIS — I4892 Unspecified atrial flutter: Secondary | ICD-10-CM | POA: Diagnosis present

## 2013-04-20 DIAGNOSIS — Z794 Long term (current) use of insulin: Secondary | ICD-10-CM

## 2013-04-20 DIAGNOSIS — I4891 Unspecified atrial fibrillation: Principal | ICD-10-CM | POA: Diagnosis present

## 2013-04-20 DIAGNOSIS — Z79899 Other long term (current) drug therapy: Secondary | ICD-10-CM

## 2013-04-20 DIAGNOSIS — I480 Paroxysmal atrial fibrillation: Secondary | ICD-10-CM

## 2013-04-20 DIAGNOSIS — I10 Essential (primary) hypertension: Secondary | ICD-10-CM

## 2013-04-20 DIAGNOSIS — I951 Orthostatic hypotension: Secondary | ICD-10-CM

## 2013-04-20 DIAGNOSIS — E119 Type 2 diabetes mellitus without complications: Secondary | ICD-10-CM | POA: Diagnosis present

## 2013-04-20 LAB — URINALYSIS, ROUTINE W REFLEX MICROSCOPIC
BILIRUBIN URINE: NEGATIVE
Glucose, UA: >1000 mg/dL — AB
HGB URINE DIPSTICK: NEGATIVE
Ketones, ur: NEGATIVE mg/dL
Leukocytes, UA: NEGATIVE
Nitrite: NEGATIVE
PH: 5 (ref 5.0–8.0)
Protein, ur: NEGATIVE mg/dL
Specific Gravity, Urine: 1.027 (ref 1.005–1.030)
Urobilinogen, UA: 0.2 mg/dL (ref 0.0–1.0)

## 2013-04-20 LAB — CBC WITH DIFFERENTIAL/PLATELET
BASOS PCT: 1 % (ref 0–1)
Basophils Absolute: 0.1 10*3/uL (ref 0.0–0.1)
EOS PCT: 3 % (ref 0–5)
Eosinophils Absolute: 0.2 10*3/uL (ref 0.0–0.7)
HEMATOCRIT: 38.3 % (ref 36.0–46.0)
HEMOGLOBIN: 12.4 g/dL (ref 12.0–15.0)
LYMPHS PCT: 27 % (ref 12–46)
Lymphs Abs: 1.8 10*3/uL (ref 0.7–4.0)
MCH: 28.4 pg (ref 26.0–34.0)
MCHC: 32.4 g/dL (ref 30.0–36.0)
MCV: 87.8 fL (ref 78.0–100.0)
MONO ABS: 0.4 10*3/uL (ref 0.1–1.0)
MONOS PCT: 7 % (ref 3–12)
NEUTROS ABS: 4.2 10*3/uL (ref 1.7–7.7)
Neutrophils Relative %: 63 % (ref 43–77)
Platelets: 184 10*3/uL (ref 150–400)
RBC: 4.36 MIL/uL (ref 3.87–5.11)
RDW: 13 % (ref 11.5–15.5)
WBC: 6.8 10*3/uL (ref 4.0–10.5)

## 2013-04-20 LAB — URINE MICROSCOPIC-ADD ON

## 2013-04-20 LAB — POCT I-STAT, CHEM 8
BUN: 19 mg/dL (ref 6–23)
CHLORIDE: 106 meq/L (ref 96–112)
CREATININE: 0.9 mg/dL (ref 0.50–1.10)
Calcium, Ion: 1.11 mmol/L — ABNORMAL LOW (ref 1.13–1.30)
GLUCOSE: 182 mg/dL — AB (ref 70–99)
HCT: 41 % (ref 36.0–46.0)
Hemoglobin: 13.9 g/dL (ref 12.0–15.0)
Potassium: 4.2 mEq/L (ref 3.7–5.3)
Sodium: 140 mEq/L (ref 137–147)
TCO2: 23 mmol/L (ref 0–100)

## 2013-04-20 LAB — POCT I-STAT TROPONIN I: Troponin i, poc: 0.01 ng/mL (ref 0.00–0.08)

## 2013-04-20 LAB — PROTIME-INR
INR: 1.44 (ref 0.00–1.49)
Prothrombin Time: 17.2 seconds — ABNORMAL HIGH (ref 11.6–15.2)

## 2013-04-20 NOTE — ED Provider Notes (Signed)
CSN: [id number]     Arrival date & time 04/20/13  1335 History   First MD Initiated Contact with Patient 04/20/13 1419     Chief Complaint  Patient presents with  . Near Syncope   (Consider location/radiation/quality/duration/timing/severity/associated sxs/prior Treatment) HPI Comments: Patient presents with a syncopal episode. She has a history of diabetes mellitus, pacemaker placement, dementia, DVT on Coumadin , and hypertension. She lives at home with family. Her husband noted that she was getting out of the bed today she became unresponsive this. He helped her to the floor. She did not fall or hit her head.  She was seen here on December 31st for similar episode of syncope. She had a negative evaluation and was noted to be orthostatic. She was discharged after IV fluids. She's fairly noncommunicative due to her dementia. Her family doesn't note any recent illnesses such as fever vomiting increased congestion. She hasn't been complaining of any pain or discomfort.  Patient is a 73 y.o. female presenting with near-syncope.  Near Syncope    Past Medical History  Diagnosis Date  . Dysphagia   . Diabetes mellitus   . Alzheimer disease   . Hypokalemia   . Anemia   . 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 DVT (deep venous thrombosis)     [facility]  . Hypertension    Past Surgical History  Procedure Laterality Date  . Syncope  05/2001    pacer  . Pacemaker placement     Family History  Problem Relation Age of Onset  . Alzheimer's disease    . Diabetes    . Stroke Father   . Diabetes Brother   . Seizures Brother   . Diabetes Brother    History  Substance Use Topics  . Smoking status: Never Smoker   . Smokeless tobacco: Not on file     Comment: Patient exposed to second hand smoke daily   . Alcohol Use: No   OB History   Grav Para Term Preterm Abortions TAB SAB Ect Mult Living                 Review of Systems  Unable to perform ROS Cardiovascular: Positive for  near-syncope.    Allergies  Ace inhibitors  Home Medications   Current Outpatient Rx  Name  Route  Sig  Dispense  Refill  . glucose blood (FREESTYLE LITE) test strip      Test blood sugar before each meal and at bedtime   100 each   12     Diagnosis Code: 250.00   . Insulin Detemir 100 UNIT/ML SOPN   Subcutaneous   Inject 19 Units into the skin daily.         . Insulin Pen Needle (NOVOFINE) 32G X 6 MM MISC      DX: 250.02, Use once daily with Levimir Injection   100 each   3   . memantine (NAMENDA) 10 MG tablet   Oral   Take 10 mg by mouth 2 (two) times daily.         . potassium chloride SA (K-DUR,KLOR-CON) 20 MEQ tablet   Oral   Take 20 mEq by mouth 2 (two) times daily.         . pravastatin (PRAVACHOL) 40 MG tablet      TAKE 1 TABLET BY MOUTH EVERY DAY   90 tablet   1   . valsartan (DIOVAN) 320 MG tablet   Oral   Take 320 mg by  mouth daily.         Marland Kitchen warfarin (COUMADIN) 5 MG tablet   Oral   Take 5-7.5 mg by mouth daily. Take 7.5mg  on Saturday. Take 5mg  the rest of the week.          BP 121/96  Pulse 58  Temp(Src) 98.1 F (36.7 C) (Oral)  Resp 13  SpO2 98% Physical Exam  Constitutional: She appears well-developed and well-nourished.  HENT:  Head: Normocephalic and atraumatic.  Mouth/Throat: Oropharynx is clear and moist.  Eyes: Pupils are equal, round, and reactive to light.  Neck: Normal range of motion. Neck supple.  Cardiovascular: Normal rate, regular rhythm and normal heart sounds.   Pulmonary/Chest: Effort normal and breath sounds normal. No respiratory distress. She has no wheezes. She has no rales. She exhibits no tenderness.  Abdominal: Soft. Bowel sounds are normal. There is no tenderness. There is no rebound and no guarding.  Musculoskeletal: Normal range of motion. She exhibits no edema.  Lymphadenopathy:    She has no cervical adenopathy.  Neurological: She is alert.  Sitting up in bed with her eyes closed but will open  her eyes to verbal stimuli. She will follow simple commands. She has equal grip strength bilaterally. She's able to raise both legs symmetrically. There's no obvious facial drooping. She is nonverbal.  Skin: Skin is warm and dry. No rash noted.  Psychiatric: She has a normal mood and affect.    ED Course  Procedures (including critical care time) Labs Review Results for orders placed during the hospital encounter of 04/20/13  CBC WITH DIFFERENTIAL      Result Value Range   WBC 6.8  4.0 - 10.5 K/uL   RBC 4.36  3.87 - 5.11 MIL/uL   Hemoglobin 12.4  12.0 - 15.0 g/dL   HCT 38.3  36.0 - 46.0 %   MCV 87.8  78.0 - 100.0 fL   MCH 28.4  26.0 - 34.0 pg   MCHC 32.4  30.0 - 36.0 g/dL   RDW 13.0  11.5 - 15.5 %   Platelets 184  150 - 400 K/uL   Neutrophils Relative % 63  43 - 77 %   Neutro Abs 4.2  1.7 - 7.7 K/uL   Lymphocytes Relative 27  12 - 46 %   Lymphs Abs 1.8  0.7 - 4.0 K/uL   Monocytes Relative 7  3 - 12 %   Monocytes Absolute 0.4  0.1 - 1.0 K/uL   Eosinophils Relative 3  0 - 5 %   Eosinophils Absolute 0.2  0.0 - 0.7 K/uL   Basophils Relative 1  0 - 1 %   Basophils Absolute 0.1  0.0 - 0.1 K/uL  URINALYSIS, ROUTINE W REFLEX MICROSCOPIC      Result Value Range   Color, Urine YELLOW  YELLOW   APPearance CLEAR  CLEAR   Specific Gravity, Urine 1.027  1.005 - 1.030   pH 5.0  5.0 - 8.0   Glucose, UA >1000 (*) NEGATIVE mg/dL   Hgb urine dipstick NEGATIVE  NEGATIVE   Bilirubin Urine NEGATIVE  NEGATIVE   Ketones, ur NEGATIVE  NEGATIVE mg/dL   Protein, ur NEGATIVE  NEGATIVE mg/dL   Urobilinogen, UA 0.2  0.0 - 1.0 mg/dL   Nitrite NEGATIVE  NEGATIVE   Leukocytes, UA NEGATIVE  NEGATIVE  URINE MICROSCOPIC-ADD ON      Result Value Range   Squamous Epithelial / LPF RARE  RARE   WBC, UA 0-2  <3 WBC/hpf  RBC / HPF 0-2  <3 RBC/hpf   Bacteria, UA RARE  RARE  PROTIME-INR      Result Value Range   Prothrombin Time 17.2 (*) 11.6 - 15.2 seconds   INR 1.44  0.00 - 1.49  POCT I-STAT, CHEM 8       Result Value Range   Sodium 140  137 - 147 mEq/L   Potassium 4.2  3.7 - 5.3 mEq/L   Chloride 106  96 - 112 mEq/L   BUN 19  6 - 23 mg/dL   Creatinine, Ser 0.90  0.50 - 1.10 mg/dL   Glucose, Bld 182 (*) 70 - 99 mg/dL   Calcium, Ion 1.11 (*) 1.13 - 1.30 mmol/L   TCO2 23  0 - 100 mmol/L   Hemoglobin 13.9  12.0 - 15.0 g/dL   HCT 41.0  36.0 - 46.0 %  POCT I-STAT TROPONIN I      Result Value Range   Troponin i, poc 0.01  0.00 - 0.08 ng/mL   Comment 3            Dg Chest 2 View  04/20/2013   CLINICAL DATA:  Fainting episode this morning  EXAM: CHEST  2 VIEW  COMPARISON:  PA and lateral chest x-ray of April 03, 2013  FINDINGS: The lungs are reasonably well inflated. There is no focal infiltrate. There is no pleural effusion. They perihilar interstitial markings are slightly more conspicuous today on the right than on the earlier study. The cardiopericardial silhouette is mildly enlarged. A permanent pacemaker is in place and appears unchanged. The observed portions of the bony thorax exhibit no acute abnormalities.  IMPRESSION: 1. There is no evidence of pneumonia. There is minimal density just above the right hemidiaphragm which may reflect subsegmental atelectasis. 2. There is mild enlargement of the cardiac silhouette which is stable. There is no pulmonary vascular congestion or pleural effusion.   Electronically Signed   By: David  Martinique   On: 04/20/2013 16:16   Dg Chest 2 View  04/03/2013   CLINICAL DATA:  Syncope  EXAM: CHEST  2 VIEW  COMPARISON:  05/28/2007  FINDINGS: Borderline cardiomegaly. Dual lead cardiac pacemaker is unchanged in position. No acute infiltrate or pulmonary edema. Osteopenia and mild degenerative changes thoracic spine.  IMPRESSION: No active cardiopulmonary disease.   Electronically Signed   By: Lahoma Crocker M.D.   On: 04/03/2013 13:55   Ct Head Wo Contrast  04/20/2013   CLINICAL DATA:  syncope  EXAM: CT HEAD WITHOUT CONTRAST  TECHNIQUE: Contiguous axial images were  obtained from the base of the skull through the vertex without intravenous contrast.  COMPARISON:  CT HEAD W/O CM dated 08/19/2010  FINDINGS: No acute intracranial hemorrhage. No focal mass lesion. No CT evidence of acute infarction. No midline shift or mass effect. No hydrocephalus. Basilar cisterns are patent. There is extensive cortical atrophy and proportional ventricular dilatation. Mild periventricular white matter hypodensities. Paranasal sinuses and mastoid air cells are clear.  IMPRESSION: 1. No acute intracranial findings.  No change from prior. 2. Extensive cortical atrophy and trace nodule dilatation   Electronically Signed   By: Suzy Bouchard M.D.   On: 04/20/2013 16:18     Imaging Review Dg Chest 2 View  04/20/2013   CLINICAL DATA:  Fainting episode this morning  EXAM: CHEST  2 VIEW  COMPARISON:  PA and lateral chest x-ray of April 03, 2013  FINDINGS: The lungs are reasonably well inflated. There is no focal infiltrate. There  is no pleural effusion. They perihilar interstitial markings are slightly more conspicuous today on the right than on the earlier study. The cardiopericardial silhouette is mildly enlarged. A permanent pacemaker is in place and appears unchanged. The observed portions of the bony thorax exhibit no acute abnormalities.  IMPRESSION: 1. There is no evidence of pneumonia. There is minimal density just above the right hemidiaphragm which may reflect subsegmental atelectasis. 2. There is mild enlargement of the cardiac silhouette which is stable. There is no pulmonary vascular congestion or pleural effusion.   Electronically Signed   By: David  Martinique   On: 04/20/2013 16:16   Ct Head Wo Contrast  04/20/2013   CLINICAL DATA:  syncope  EXAM: CT HEAD WITHOUT CONTRAST  TECHNIQUE: Contiguous axial images were obtained from the base of the skull through the vertex without intravenous contrast.  COMPARISON:  CT HEAD W/O CM dated 08/19/2010  FINDINGS: No acute intracranial  hemorrhage. No focal mass lesion. No CT evidence of acute infarction. No midline shift or mass effect. No hydrocephalus. Basilar cisterns are patent. There is extensive cortical atrophy and proportional ventricular dilatation. Mild periventricular white matter hypodensities. Paranasal sinuses and mastoid air cells are clear.  IMPRESSION: 1. No acute intracranial findings.  No change from prior. 2. Extensive cortical atrophy and trace nodule dilatation   Electronically Signed   By: Suzy Bouchard M.D.   On: 04/20/2013 16:18    EKG Interpretation    Date/Time:  Saturday April 20 2013 13:46:22 EST Ventricular Rate:  57 PR Interval:  191 QRS Duration: 106 QT Interval:  461 QTC Calculation: 449 R Axis:   19 Text Interpretation:  Sinus rhythm Borderline low voltage, extremity leads Abnormal T, consider ischemia, diffuse leads since last tracing no significant change Confirmed by Kiasha Bellin  MD, Korbyn Chopin ([id number]) on 04/20/2013 2:59:55 PM            MDM   1. Syncope    Pt presents with syncope.  SHe had 2 prior syncopal episodes 2 weeks ago.  I don't find any infectious etiologies. She has no other change in mental status or suggestions of an acute CVA. I am concerned that she is bradycardic in the 50s with an implanted pacemaker. She had her pacemaker changed out in 2012. I spoke with Dr. Oleta Mouse with cardiology who will admit the patient. We did attempt to interrogate the pacemaker in the ED but it was unsuccessful. We will contact the [employer] rep to come in for a pacemaker interrogation.    Malvin Johns, MD 04/20/13 1946

## 2013-04-20 NOTE — ED Notes (Signed)
Aaron Edelman called back and advised he couldn't transmit remotely and would come in to see the patient in the next 30 minutes or so.

## 2013-04-20 NOTE — ED Notes (Signed)
Spoke to Pataha from [facility] to see about having the patient's pacemaker interrogated.  Advised he would call back shortly.  Dr. Tamera Punt.

## 2013-04-20 NOTE — H&P (Signed)
Physician History and Physical    [name] MRN: [id number] DOB/AGE: 1940-04-19 73 y.o. Admit date: 04/20/2013  Primary Cardiologist:  Dr. Lovena Le  CC:  Recurrent Syncope  HPI:  73 yo female with d/o HTN, DM, DVT, dementia and PPM placement for symptomatic bradycardia, h/o syncope and unstable gaits, who presented to ED today for another episode of syncope. Patient family described the syncope as first started when she was sitting with diaphoresis and then collapsed before husband caught her. She did not hit ground. When EMS arrived, she had a normal BG level and borderline low BP.  Her last PPM interrogation was unremarkable but her HR was found to be brady to 50 in ER. Similar episode happened during new year's eve and was found to have orthostatic hypotension, but this time orthostatic signs were negative.   Patient is in advanced dementia and is non-verbal therefore symptoms history is limited. In ER an PPM interrogation did not show any arrhythmia, mode switching or malfunction. Her base rate was set as 50 bpm.   Review of systems: A review of 10 organ systems was done and is negative except as stated above in HPI  Past Medical History  Diagnosis Date  . Dysphagia   . Diabetes mellitus   . Alzheimer disease   . Hypokalemia   . Anemia   . 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 DVT (deep venous thrombosis)     [facility]  . Hypertension    Past Surgical History  Procedure Laterality Date  . Syncope  05/2001    pacer  . Pacemaker placement     History   Social History  . Marital Status: Married    Spouse Name: N/A    Number of Children: N/A  . Years of Education: N/A   Occupational History  . Not on file.   Social History Main Topics  . Smoking status: Never Smoker   . Smokeless tobacco: Not on file     Comment: Patient exposed to second hand smoke daily   . Alcohol Use: No  . Drug Use: No  . Sexual Activity: Not on file   Other Topics Concern  . Not  on file   Social History Narrative   Household includes her husband, one daughter, one granddaughter, one Product manager and a son who is a Dietitian and spends most of the time outside the house.    Family History  Problem Relation Age of Onset  . Alzheimer's disease    . Diabetes    . Stroke Father   . Diabetes Brother   . Seizures Brother   . Diabetes Brother      Allergies  Allergen Reactions  . Ace Inhibitors     REACTION: cough     (Not in a hospital admission) No current facility-administered medications for this encounter.   Current Outpatient Prescriptions  Medication Sig Dispense Refill  . glucose blood (FREESTYLE LITE) test strip Test blood sugar before each meal and at bedtime  100 each  12  . Insulin Detemir 100 UNIT/ML SOPN Inject 19 Units into the skin daily.      . Insulin Pen Needle (NOVOFINE) 32G X 6 MM MISC DX: 250.02, Use once daily with Levimir Injection  100 each  3  . memantine (NAMENDA) 10 MG tablet Take 10 mg by mouth 2 (two) times daily.      . potassium chloride SA (K-DUR,KLOR-CON) 20 MEQ tablet Take 20 mEq by mouth 2 (two)  times daily.      . pravastatin (PRAVACHOL) 40 MG tablet TAKE 1 TABLET BY MOUTH EVERY DAY  90 tablet  1  . valsartan (DIOVAN) 320 MG tablet Take 320 mg by mouth daily.      Marland Kitchen warfarin (COUMADIN) 5 MG tablet Take 5-7.5 mg by mouth daily. Take 7.5mg  on Saturday. Take 5mg  the rest of the week.       Physical Exam: Blood pressure 121/96, pulse 58, temperature 98.1 F (36.7 C), temperature source Oral, resp. rate 13, SpO2 98.00%.; There is no weight on file to calculate BMI. Temp:  [98.1 F (36.7 C)] 98.1 F (36.7 C) (01/17 1348) Pulse Rate:  [55-104] 58 (01/17 1845) Resp:  [13-17] 13 (01/17 1845) BP: (115-139)/(57-96) 121/96 mmHg (01/17 1845) SpO2:  [97 %-100 %] 98 % (01/17 1845)  No intake or output data in the 24 hours ending 04/20/13 2014 General: NAD Heent: MMM Neck: No JVD  CV: Nondisplaced PMI.  RRR, nl S1/S2,  no S3/S4, no murmur. No carotid bruit   Lungs: Clear to auscultation bilaterally with normal respiratory effort Abdomen: Soft, nontender, nondistended Extremities: No clubbing or cyanosis.  Normal pedal pulses. No pedal edema Skin: Intact without lesions or rashes  Neurologic: Alert and oriented x 3, grossly nonfocal  Psych: Normal mood and affect    Labs: No results found for this basename: CKTOTAL, CKMB, TROPONINI,  in the last 72 hours Lab Results  Component Value Date   WBC 6.8 04/20/2013   HGB 13.9 04/20/2013   HCT 41.0 04/20/2013   MCV 87.8 04/20/2013   PLT 184 04/20/2013    Recent Labs Lab 04/20/13 1438  NA 140  K 4.2  CL 106  BUN 19  CREATININE 0.90  GLUCOSE 182*   Lab Results  Component Value Date   CHOL 200 01/18/2013   HDL 51.90 01/18/2013   LDLCALC 71 12/16/2011   TRIG 225.0* 01/18/2013       EKG:  Sinus rhythm, low voltage with lateral TWI, no changes compared to ECG 04/03/13. Radiology:  Dg Chest 2 View  04/20/2013   CLINICAL DATA:  Fainting episode this morning  EXAM: CHEST  2 VIEW  COMPARISON:  PA and lateral chest x-ray of April 03, 2013  FINDINGS: The lungs are reasonably well inflated. There is no focal infiltrate. There is no pleural effusion. They perihilar interstitial markings are slightly more conspicuous today on the right than on the earlier study. The cardiopericardial silhouette is mildly enlarged. A permanent pacemaker is in place and appears unchanged. The observed portions of the bony thorax exhibit no acute abnormalities.  IMPRESSION: 1. There is no evidence of pneumonia. There is minimal density just above the right hemidiaphragm which may reflect subsegmental atelectasis. 2. There is mild enlargement of the cardiac silhouette which is stable. There is no pulmonary vascular congestion or pleural effusion.   Electronically Signed   By: David  Martinique   On: 04/20/2013 16:16   Ct Head Wo Contrast  04/20/2013   CLINICAL DATA:  syncope  EXAM: CT  HEAD WITHOUT CONTRAST  TECHNIQUE: Contiguous axial images were obtained from the base of the skull through the vertex without intravenous contrast.  COMPARISON:  CT HEAD W/O CM dated 08/19/2010  FINDINGS: No acute intracranial hemorrhage. No focal mass lesion. No CT evidence of acute infarction. No midline shift or mass effect. No hydrocephalus. Basilar cisterns are patent. There is extensive cortical atrophy and proportional ventricular dilatation. Mild periventricular white matter hypodensities. Paranasal sinuses and mastoid  air cells are clear.  IMPRESSION: 1. No acute intracranial findings.  No change from prior. 2. Extensive cortical atrophy and trace nodule dilatation   Electronically Signed   By: Suzy Bouchard M.D.   On: 04/20/2013 16:18    ASSESSMENT:  73 yo female with d/o HTN, DM, DVT, dementia and PPM placement for symptomatic bradycardia, h/o syncope and unstable gaits, who presented to ED today for another episode of syncope.  IMPRESSIONS: 1. Syncope with unclear etiology, PPM interrogation is negative for arrhythmia or PPM malfunction, not likely PE in the setting of anticoagulation and lack of hypoxia/tachycardia, not likely ischemic due to lack of ECG and enzyme changes, likely neurocardiogenic, but will rule out cardiac and carotid structural etiologies.  2. H/o DVT, on Warfarin 3. Advanced dementia  PLAN:  1. Cycle Troponin x3 2. Echocardiogram 3. Carotid doppler 4. D-dimer, if elevated, consider CTA rule out PE.  5. Adequate hydration.   Signed: Manus Gunning, MD Cardiology Fellow 04/20/2013, 8:14 PM

## 2013-04-20 NOTE — ED Notes (Signed)
Per EMS- pt was helped to floor by family. Family was unable to provide EMS with much information. They were called out for a possible syncopal episode. Family reports that pt did not fall though. Pt has alzheimer's and has limited speech

## 2013-04-21 ENCOUNTER — Observation Stay (HOSPITAL_COMMUNITY): Payer: Medicare Other

## 2013-04-21 DIAGNOSIS — R55 Syncope and collapse: Secondary | ICD-10-CM

## 2013-04-21 DIAGNOSIS — Z950 Presence of cardiac pacemaker: Secondary | ICD-10-CM

## 2013-04-21 DIAGNOSIS — G309 Alzheimer's disease, unspecified: Secondary | ICD-10-CM

## 2013-04-21 DIAGNOSIS — F0280 Dementia in other diseases classified elsewhere without behavioral disturbance: Secondary | ICD-10-CM

## 2013-04-21 LAB — CBC
HCT: 33.5 % — ABNORMAL LOW (ref 36.0–46.0)
Hemoglobin: 11 g/dL — ABNORMAL LOW (ref 12.0–15.0)
MCH: 28.6 pg (ref 26.0–34.0)
MCHC: 32.8 g/dL (ref 30.0–36.0)
MCV: 87 fL (ref 78.0–100.0)
Platelets: 168 10*3/uL (ref 150–400)
RBC: 3.85 MIL/uL — ABNORMAL LOW (ref 3.87–5.11)
RDW: 13 % (ref 11.5–15.5)
WBC: 6.2 10*3/uL (ref 4.0–10.5)

## 2013-04-21 LAB — BASIC METABOLIC PANEL
BUN: 19 mg/dL (ref 6–23)
CHLORIDE: 99 meq/L (ref 96–112)
CO2: 20 mEq/L (ref 19–32)
Calcium: 8.9 mg/dL (ref 8.4–10.5)
Creatinine, Ser: 0.85 mg/dL (ref 0.50–1.10)
GFR, EST AFRICAN AMERICAN: 77 mL/min — AB (ref >90)
GFR, EST NON AFRICAN AMERICAN: 67 mL/min — AB (ref >90)
Glucose, Bld: 205 mg/dL — ABNORMAL HIGH (ref 70–99)
POTASSIUM: 4.4 meq/L (ref 3.7–5.3)
SODIUM: 136 meq/L — AB (ref 137–147)

## 2013-04-21 LAB — GLUCOSE, CAPILLARY
GLUCOSE-CAPILLARY: 227 mg/dL — AB (ref 70–99)
GLUCOSE-CAPILLARY: 208 mg/dL — AB (ref 70–99)
Glucose-Capillary: 296 mg/dL — ABNORMAL HIGH (ref 70–99)
Glucose-Capillary: 326 mg/dL — ABNORMAL HIGH (ref 70–99)
Glucose-Capillary: 393 mg/dL — ABNORMAL HIGH (ref 70–99)
Glucose-Capillary: 328 mg/dL — ABNORMAL HIGH (ref 70–99)

## 2013-04-21 LAB — TROPONIN I: Troponin I: <0.3 ng/mL (ref <0.30)

## 2013-04-21 LAB — PROTIME-INR
INR: 1.29 (ref 0.00–1.49)
PROTHROMBIN TIME: 15.8 s — AB (ref 11.6–15.2)

## 2013-04-21 LAB — TSH: TSH: 2.985 u[IU]/mL (ref 0.350–4.500)

## 2013-04-21 LAB — D-DIMER, QUANTITATIVE (NOT AT ARMC): D DIMER QUANT: 2.03 ug{FEU}/mL — AB (ref 0.00–0.48)

## 2013-04-21 LAB — T4, FREE: FREE T4: 1.03 ng/dL (ref 0.80–1.80)

## 2013-04-21 MED ORDER — MEMANTINE HCL 10 MG PO TABS
10.0000 mg | ORAL_TABLET | Freq: Two times a day (BID) | ORAL | Status: DC
  Administered 2013-04-21 – 2013-04-23 (×5): 10 mg via ORAL
  Filled 2013-04-21 (×7): qty 1

## 2013-04-21 MED ORDER — WARFARIN - PHYSICIAN DOSING INPATIENT
Freq: Every day | Status: DC
Start: 2013-04-21 — End: 2013-04-21

## 2013-04-21 MED ORDER — WARFARIN SODIUM 7.5 MG PO TABS
7.5000 mg | ORAL_TABLET | Freq: Once | ORAL | Status: AC
  Administered 2013-04-21: 7.5 mg via ORAL
  Filled 2013-04-21: qty 1

## 2013-04-21 MED ORDER — ONDANSETRON HCL 4 MG/2ML IJ SOLN: 4.0000 mg | Freq: Four times a day (QID) | INTRAMUSCULAR | Status: DC | PRN

## 2013-04-21 MED ORDER — ACETAMINOPHEN 325 MG PO TABS: 650.0000 mg | ORAL_TABLET | ORAL | Status: DC | PRN

## 2013-04-21 MED ORDER — SIMVASTATIN 20 MG PO TABS
20.0000 mg | ORAL_TABLET | Freq: Every day | ORAL | Status: DC
  Administered 2013-04-21 – 2013-04-22 (×2): 20 mg via ORAL
  Filled 2013-04-21 (×3): qty 1

## 2013-04-21 MED ORDER — WARFARIN SODIUM 5 MG PO TABS
5.0000 mg | ORAL_TABLET | ORAL | Status: DC
Start: 2013-04-21 — End: 2013-04-21
  Filled 2013-04-21: qty 1

## 2013-04-21 MED ORDER — ENOXAPARIN SODIUM 40 MG/0.4ML ~~LOC~~ SOLN
40.0000 mg | SUBCUTANEOUS | Status: DC
  Filled 2013-04-21: qty 0.4

## 2013-04-21 MED ORDER — INSULIN ASPART 100 UNIT/ML ~~LOC~~ SOLN
0.0000 [IU] | Freq: Three times a day (TID) | SUBCUTANEOUS | Status: DC
  Administered 2013-04-21: 11 [IU] via SUBCUTANEOUS

## 2013-04-21 MED ORDER — SODIUM CHLORIDE 0.9 % IV SOLN
INTRAVENOUS | Status: AC
  Administered 2013-04-21: 13:00:00 via INTRAVENOUS

## 2013-04-21 MED ORDER — IOHEXOL 350 MG/ML SOLN
50.0000 mL | Freq: Once | INTRAVENOUS | Status: AC | PRN
  Administered 2013-04-21: 50 mL via INTRAVENOUS

## 2013-04-21 MED ORDER — ENOXAPARIN SODIUM 40 MG/0.4ML ~~LOC~~ SOLN
40.0000 mg | SUBCUTANEOUS | Status: DC
  Administered 2013-04-22 – 2013-04-23 (×2): 40 mg via SUBCUTANEOUS
  Filled 2013-04-21 (×2): qty 0.4

## 2013-04-21 MED ORDER — INSULIN DETEMIR 100 UNIT/ML FLEXPEN: 19.0000 [IU] | PEN_INJECTOR | Freq: Every day | SUBCUTANEOUS | Status: DC

## 2013-04-21 MED ORDER — INSULIN ASPART 100 UNIT/ML ~~LOC~~ SOLN: 0.0000 [IU] | Freq: Three times a day (TID) | SUBCUTANEOUS | Status: DC

## 2013-04-21 MED ORDER — POTASSIUM CHLORIDE CRYS ER 20 MEQ PO TBCR
20.0000 meq | EXTENDED_RELEASE_TABLET | Freq: Two times a day (BID) | ORAL | Status: DC
  Administered 2013-04-21 – 2013-04-23 (×5): 20 meq via ORAL
  Filled 2013-04-21 (×6): qty 1

## 2013-04-21 MED ORDER — WARFARIN - PHARMACIST DOSING INPATIENT
Freq: Every day | Status: DC
  Administered 2013-04-22: 18:00:00

## 2013-04-21 MED ORDER — INSULIN DETEMIR 100 UNIT/ML ~~LOC~~ SOLN
19.0000 [IU] | Freq: Every day | SUBCUTANEOUS | Status: DC
  Administered 2013-04-21 – 2013-04-23 (×3): 19 [IU] via SUBCUTANEOUS
  Filled 2013-04-21 (×3): qty 0.19

## 2013-04-21 MED ORDER — WARFARIN SODIUM 7.5 MG PO TABS: 7.5000 mg | ORAL_TABLET | ORAL | Status: DC

## 2013-04-21 NOTE — Progress Notes (Signed)
Pt's Ddimer back at 2.03. Dr. Oleta Mouse notified. Will cont to monitor.

## 2013-04-21 NOTE — Progress Notes (Signed)
Utilization review completed.  

## 2013-04-21 NOTE — Progress Notes (Signed)
Primary cardiologist: Dr. Cristopher Peru  Subjective:    The patient is nonverbal. No distress.  Objective:   Temp:  [98.1 F (36.7 C)-98.6 F (37 C)] 98.6 F (37 C) (01/18 0540) Pulse Rate:  [53-104] 69 (01/18 0542) Resp:  [12-18] 18 (01/18 0540) BP: (97-147)/(57-129) 144/129 mmHg (01/18 0542) SpO2:  [94 %-100 %] 98 % (01/18 0542) Weight:  [172 lb 1.6 oz (78.064 kg)] 172 lb 1.6 oz (78.064 kg) (01/18 0109)    Filed Weights   04/21/13 0109  Weight: 172 lb 1.6 oz (78.064 kg)    Intake/Output Summary (Last 24 hours) at 04/21/13 1229 Last data filed at 04/21/13 0600  Gross per 24 hour  Intake      0 ml  Output      0 ml  Net      0 ml    Telemetry: Sinus rhythm, occasional ectopy, artifact.  Exam:  General: No distress.  Lungs: Nonlabored, clear anteriorly.  Cardiac: RRR, no gallop.  Extremities: No pitting.   Lab Results:  Basic Metabolic Panel:  Recent Labs Lab 04/20/13 1438 04/21/13 0444  NA 140 136*  K 4.2 4.4  CL 106 99  CO2  --  20  GLUCOSE 182* 205*  BUN 19 19  CREATININE 0.90 0.85  CALCIUM  --  8.9    CBC:  Recent Labs Lab 04/20/13 1414 04/20/13 1438 04/21/13 0820  WBC 6.8  --  6.2  HGB 12.4 13.9 11.0*  HCT 38.3 41.0 33.5*  MCV 87.8  --  87.0  PLT 184  --  168    Cardiac Enzymes:  Recent Labs Lab 04/21/13 0441  TROPONINI <0.30    D-dimer 2.03   ECG: Sinus rhythm with inferolateral ST-T wave changes, cannot exclude ischemia or prior event.   Imaging CT HEAD WITHOUT CONTRAST  TECHNIQUE:  Contiguous axial images were obtained from the base of the skull  through the vertex without intravenous contrast.  COMPARISON: CT HEAD W/O CM dated 08/19/2010  FINDINGS:  No acute intracranial hemorrhage. No focal mass lesion. No CT  evidence of acute infarction. No midline shift or mass effect. No  hydrocephalus. Basilar cisterns are patent. There is extensive  cortical atrophy and proportional ventricular dilatation. Mild    periventricular white matter hypodensities. Paranasal sinuses and  mastoid air cells are clear.  IMPRESSION:  1. No acute intracranial findings. No change from prior.  2. Extensive cortical atrophy and trace nodule dilatation   Medications:   Scheduled Medications: . [START ON 04/22/2013] enoxaparin (LOVENOX) injection  40 mg Subcutaneous Q24H  . insulin detemir  19 Units Subcutaneous Daily  . memantine  10 mg Oral BID  . potassium chloride SA  20 mEq Oral BID  . simvastatin  20 mg Oral q1800  . warfarin  5 mg Oral Custom  . [START ON 04/27/2013] warfarin  7.5 mg Oral Custom  . Warfarin - Physician Dosing Inpatient   Does not apply q1800      PRN Medications:  acetaminophen, ondansetron (ZOFRAN) IV   Assessment:   1. Recurrent syncope. History is very limited as patient has advanced dementia and does not communicate verbally. She has a history of symptomatic bradycardia status post pacemaker placement with normal device function over time. Interrogation per fellow admission note demonstrates no malfunction. Patient does have an elevated d-dimer of 2.0, however has been on Coumadin, presently subtherapeutic however. Pulmonary embolus still seems unlikely. She also has evidence of orthostasis on vital sign checks this  morning. Antihypertensives are being held.  2. History of DVT, on Coumadin.  3. Advanced dementia.   Plan/Discussion:    Diovan being held at present. Will hydrate gently, may actually need to be on a volume expander such as Florinef. CTA angiogram of the chest will be obtained to exclude pulmonary embolus, although this is less likely in the differential. Coumadin is being followed by pharmacy.   Satira Sark, M.D., F.A.C.C.

## 2013-04-21 NOTE — Progress Notes (Signed)
ANTICOAGULATION CONSULT NOTE - Initial Consult  Pharmacy Consult for coumadin Indication: DVT  Allergies  Allergen Reactions  . Ace Inhibitors     REACTION: cough    Patient Measurements: Height: 5\' 8"  (172.7 cm) Weight: 172 lb 1.6 oz (78.064 kg) IBW/kg (Calculated) : 63.9 Heparin Dosing Weight:   Vital Signs: Temp: 98.6 F (37 C) (01/18 0540) Temp src: Oral (01/18 0540) BP: 144/129 mmHg (01/18 0542) Pulse Rate: 69 (01/18 0542)  Labs:  Recent Labs  04/20/13 1414 04/20/13 1438 04/20/13 1528 04/21/13 0441 04/21/13 0444 04/21/13 0820  HGB 12.4 13.9  --   --   --  11.0*  HCT 38.3 41.0  --   --   --  33.5*  PLT 184  --   --   --   --  168  LABPROT  --   --  17.2*  --  15.8*  --   INR  --   --  1.44  --  1.29  --   CREATININE  --  0.90  --   --  0.85  --   TROPONINI  --   --   --  <0.30  --   --     Estimated Creatinine Clearance: 65.7 ml/min (by C-G formula based on Cr of 0.85).   Medical History: Past Medical History  Diagnosis Date  . Dysphagia   . Diabetes mellitus   . Alzheimer disease   . Hypokalemia   . Anemia   . 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 DVT (deep venous thrombosis)     [facility]  . Hypertension     Medications:  Prescriptions prior to admission  Medication Sig Dispense Refill  . glucose blood (FREESTYLE LITE) test strip Test blood sugar before each meal and at bedtime  100 each  12  . Insulin Detemir 100 UNIT/ML SOPN Inject 19 Units into the skin daily.      . Insulin Pen Needle (NOVOFINE) 32G X 6 MM MISC DX: 250.02, Use once daily with Levimir Injection  100 each  3  . memantine (NAMENDA) 10 MG tablet Take 10 mg by mouth 2 (two) times daily.      . potassium chloride SA (K-DUR,KLOR-CON) 20 MEQ tablet Take 20 mEq by mouth 2 (two) times daily.      . pravastatin (PRAVACHOL) 40 MG tablet TAKE 1 TABLET BY MOUTH EVERY DAY  90 tablet  1  . valsartan (DIOVAN) 320 MG tablet Take 320 mg by mouth daily.      Marland Kitchen warfarin (COUMADIN) 5 MG  tablet Take 5-7.5 mg by mouth daily. Take 7.5mg  on Saturday. Take 5mg  the rest of the week.       Scheduled:  . [START ON 04/22/2013] enoxaparin (LOVENOX) injection  40 mg Subcutaneous Q24H  . insulin detemir  19 Units Subcutaneous Daily  . memantine  10 mg Oral BID  . potassium chloride SA  20 mEq Oral BID  . simvastatin  20 mg Oral q1800   Infusions:  . sodium chloride      Assessment: 73 yo who was admitted for recurrent syncope. He has been on coumadin for DVT. His admission INR was subtherapeutic. D-dimer was 2. CT is being ordered to r/o PE. Restarting coumadin.  Goal of Therapy:  INR 2-3 Monitor platelets by anticoagulation protocol: Yes   Plan:   Coumadin 7.5mg  PO x1 Daily INR F/u with CT

## 2013-04-21 NOTE — Progress Notes (Signed)
VASCULAR LAB PRELIMINARY  PRELIMINARY  PRELIMINARY  PRELIMINARY  Carotid Dopplers completed.    Preliminary report:  1-39% ICA stenosis.  Vertebral artery flow is antegrade.  Infinity Jeffords, RVT 04/21/2013, 11:56 AM

## 2013-04-22 DIAGNOSIS — R55 Syncope and collapse: Secondary | ICD-10-CM

## 2013-04-22 DIAGNOSIS — I951 Orthostatic hypotension: Secondary | ICD-10-CM

## 2013-04-22 DIAGNOSIS — I480 Paroxysmal atrial fibrillation: Secondary | ICD-10-CM | POA: Diagnosis present

## 2013-04-22 DIAGNOSIS — I10 Essential (primary) hypertension: Secondary | ICD-10-CM

## 2013-04-22 DIAGNOSIS — I4891 Unspecified atrial fibrillation: Principal | ICD-10-CM

## 2013-04-22 DIAGNOSIS — Z7901 Long term (current) use of anticoagulants: Secondary | ICD-10-CM

## 2013-04-22 LAB — BASIC METABOLIC PANEL
BUN: 18 mg/dL (ref 6–23)
CALCIUM: 8.9 mg/dL (ref 8.4–10.5)
CO2: 25 mEq/L (ref 19–32)
Chloride: 102 mEq/L (ref 96–112)
Creatinine, Ser: 0.97 mg/dL (ref 0.50–1.10)
GFR calc Af Amer: 66 mL/min — ABNORMAL LOW (ref >90)
GFR, EST NON AFRICAN AMERICAN: 57 mL/min — AB (ref >90)
GLUCOSE: 180 mg/dL — AB (ref 70–99)
Potassium: 4 mEq/L (ref 3.7–5.3)
Sodium: 139 mEq/L (ref 137–147)

## 2013-04-22 LAB — GLUCOSE, CAPILLARY
GLUCOSE-CAPILLARY: 238 mg/dL — AB (ref 70–99)
Glucose-Capillary: 130 mg/dL — ABNORMAL HIGH (ref 70–99)
Glucose-Capillary: 172 mg/dL — ABNORMAL HIGH (ref 70–99)
Glucose-Capillary: 200 mg/dL — ABNORMAL HIGH (ref 70–99)
Glucose-Capillary: 187 mg/dL — ABNORMAL HIGH (ref 70–99)

## 2013-04-22 LAB — CBC
HEMATOCRIT: 33.6 % — AB (ref 36.0–46.0)
Hemoglobin: 10.9 g/dL — ABNORMAL LOW (ref 12.0–15.0)
MCH: 28.2 pg (ref 26.0–34.0)
MCHC: 32.4 g/dL (ref 30.0–36.0)
MCV: 87 fL (ref 78.0–100.0)
Platelets: 172 10*3/uL (ref 150–400)
RBC: 3.86 MIL/uL — AB (ref 3.87–5.11)
RDW: 13.1 % (ref 11.5–15.5)
WBC: 6.3 10*3/uL (ref 4.0–10.5)

## 2013-04-22 LAB — PROTIME-INR
INR: 1.23 (ref 0.00–1.49)
Prothrombin Time: 15.2 seconds (ref 11.6–15.2)

## 2013-04-22 MED ORDER — DIGOXIN 125 MCG PO TABS
0.1250 mg | ORAL_TABLET | Freq: Every day | ORAL | Status: DC
  Administered 2013-04-22 – 2013-04-23 (×2): 0.125 mg via ORAL
  Filled 2013-04-22 (×2): qty 1

## 2013-04-22 MED ORDER — INSULIN ASPART 100 UNIT/ML ~~LOC~~ SOLN
0.0000 [IU] | Freq: Three times a day (TID) | SUBCUTANEOUS | Status: DC
  Administered 2013-04-22: 2 [IU] via SUBCUTANEOUS
  Administered 2013-04-22: 3 [IU] via SUBCUTANEOUS
  Administered 2013-04-22: 2 [IU] via SUBCUTANEOUS
  Administered 2013-04-23: 5 [IU] via SUBCUTANEOUS
  Administered 2013-04-23: 1 [IU] via SUBCUTANEOUS

## 2013-04-22 MED ORDER — INSULIN ASPART 100 UNIT/ML ~~LOC~~ SOLN: 0.0000 [IU] | Freq: Three times a day (TID) | SUBCUTANEOUS | Status: DC

## 2013-04-22 MED ORDER — WARFARIN SODIUM 7.5 MG PO TABS
7.5000 mg | ORAL_TABLET | Freq: Once | ORAL | Status: AC
  Administered 2013-04-22: 7.5 mg via ORAL
  Filled 2013-04-22: qty 1

## 2013-04-22 NOTE — Progress Notes (Signed)
ANTICOAGULATION CONSULT NOTE - Follow Up Consult  Pharmacy Consult for Coumadin Indication: hx DVT  Allergies  Allergen Reactions  . Ace Inhibitors     REACTION: cough    Patient Measurements: Height: 5\' 8"  (172.7 cm) Weight: 170 lb 12.8 oz (77.474 kg) IBW/kg (Calculated) : 63.9  Vital Signs: Temp: 98.1 F (36.7 C) (01/19 0556) Temp src: Oral (01/19 0556) BP: 155/77 mmHg (01/19 0556) Pulse Rate: 71 (01/19 0556)  Labs:  Recent Labs  04/20/13 1414 04/20/13 1438 04/20/13 1528 04/21/13 0441 04/21/13 0444 04/21/13 0820 04/21/13 1310 04/21/13 2030 04/22/13 0447  HGB 12.4 13.9  --   --   --  11.0*  --   --  10.9*  HCT 38.3 41.0  --   --   --  33.5*  --   --  33.6*  PLT 184  --   --   --   --  168  --   --  172  LABPROT  --   --  17.2*  --  15.8*  --   --   --  15.2  INR  --   --  1.44  --  1.29  --   --   --  1.23  CREATININE  --  0.90  --   --  0.85  --   --   --  0.97  TROPONINI  --   --   --  <0.30  --   --  <0.30 <0.30  --     Estimated Creatinine Clearance: 57.4 ml/min (by C-G formula based on Cr of 0.97).  Assessment: 72yof continues on coumadin for hx DVT. PE ruled out. INR remains subtherapeutic at 1.23. Appears no coumadin was given 1/17. CBC is stable. No bleeding reported.  Goal of Therapy:  INR 2-3 Monitor platelets by anticoagulation protocol: Yes   Plan:  1) Repeat coumadin 7.5mg  x 1 2) INR in AM  Deboraha Sprang 04/22/2013,9:45 AM

## 2013-04-22 NOTE — Progress Notes (Signed)
UR completed. Patient changed to inpatient r/t requiring con't medication adjustments with known cardiac disease.

## 2013-04-22 NOTE — Progress Notes (Signed)
  Echocardiogram 2D Echocardiogram has been performed.  Donata Clay 04/22/2013, 1:49 PM

## 2013-04-22 NOTE — Progress Notes (Signed)
    Subjective:  No complaints, demented, laying in bed, husband at bedside, daughter present.  Objective:  Vital Signs in the last 24 hours: Temp:  [97.6 F (36.4 C)-98.1 F (36.7 C)] 98.1 F (36.7 C) (01/19 0556) Pulse Rate:  [63-73] 71 (01/19 0556) Resp:  [16-18] 18 (01/19 0556) BP: (124-155)/(64-79) 155/77 mmHg (01/19 0556) SpO2:  [95 %-99 %] 97 % (01/19 0556) Weight:  [170 lb 12.8 oz (77.474 kg)] 170 lb 12.8 oz (77.474 kg) (01/19 0556)  Intake/Output from previous day: 01/18 0701 - 01/19 0700 In: 33 [P.O.:960] Out: 400 [Urine:400]   Physical Exam: General: Elderly, eyes shut, demented. Head:  Normocephalic and atraumatic. Lungs: Clear to auscultation and percussion. Heart: Normal S1 and S2.  No murmur, rubs or gallops. No ectopy Abdomen: soft, non-tender, positive bowel sounds. Extremities: No clubbing or cyanosis. No edema. Neurologic: Eyes shut, nonverbal    Lab Results:  Recent Labs  04/21/13 0820 04/22/13 0447  WBC 6.2 6.3  HGB 11.0* 10.9*  PLT 168 172    Recent Labs  04/21/13 0444 04/22/13 0447  NA 136* 139  K 4.4 4.0  CL 99 102  CO2 20 25  GLUCOSE 205* 180*  BUN 19 18  CREATININE 0.85 0.97    Recent Labs  04/21/13 1310 04/21/13 2030  TROPONINI <0.30 <0.30   Imaging: CT angiogram-no PE   Telemetry: Paroxysmal atrial fibrillation, heart rate of 122 beats per minute. Transient atrial pacing, currently sinus rhythm/sinus bradycardia. Personally viewed.   Cardiac Studies:  Echocardiogram-2004-EF normal 65%  Assessment/Plan:  Active Problems:   Syncope  73 year old female with syncope, previous symptomatic bradycardia status post pacemaker placement, paroxysmal atrial fibrillation, chronic anticoagulation, Alzheimer's dementia.  1. Syncope-pulmonary embolism excluded. Blood pressure medicines withdrawn, Diovan 320 mg. In review of her previous office note, I do not see any rate controlling medications, i.e. beta blockers or calcium  channel blockers or digoxin. Percocet was described as prodrome of diaphoresis while sitting, collapsed, husband caught her. Borderline low blood pressure when EMS arrived. I did discover refill episodes of paroxysmal atrial fibrillation, lasting less than 20 minutes duration on telemetry monitor. Episode occurred at approximate 7 AM this morning. Since she has had issues with orthostatic hypotension in the past, I will add digoxin 125 mcg. Normally, we would proceed with either calcium channel blocker or beta blocker in this situation. However given her limited mobility, digoxin seems like a reasonable choice.  2. Orthostatic hypotension-she has had episodes of this in the past, and new years. According to history and physical, this time orthostatic signs were negative. I agree with discontinuation of medications, antihypertensives.  3. Pacemaker-functioning properly. According to history and physical, her last pacemaker interrogation was unremarkable. In ER, pacemaker interrogation did not show any arrhythmia mode switching or malfunction. I do see brief periods of atrial fibrillation, at 7 AM. Could consider re\re interrogation of pacemaker but this is not necessary at this time. Will go ahead and treat medically.  4. Atrial fibrillation-paroxysmal, brief period. Adding digoxin. Are ready on chronic anticoagulation. INR currently 1.44. On anticoagulation with Coumadin. Allowed to drift upward.  5. History of DVT- chronic anticoagulation  Hopeful discharge tomorrow if stable.  SKAINS, [place] 04/22/2013, 8:06 AM

## 2013-04-23 LAB — PROTIME-INR
INR: 1.32 (ref 0.00–1.49)
Prothrombin Time: 16.1 seconds — ABNORMAL HIGH (ref 11.6–15.2)

## 2013-04-23 LAB — GLUCOSE, CAPILLARY
Glucose-Capillary: 125 mg/dL — ABNORMAL HIGH (ref 70–99)
Glucose-Capillary: 255 mg/dL — ABNORMAL HIGH (ref 70–99)

## 2013-04-23 MED ORDER — DIGOXIN 125 MCG PO TABS: 0.1250 mg | ORAL_TABLET | Freq: Every day | ORAL | Status: DC

## 2013-04-23 MED ORDER — WARFARIN SODIUM 10 MG PO TABS
10.0000 mg | ORAL_TABLET | Freq: Once | ORAL | Status: DC
  Filled 2013-04-23: qty 1

## 2013-04-23 NOTE — Progress Notes (Signed)
Reviewed discharge instructions with patient's daughter and she stated her understanding.  Education given on atrial fibrillation and digoxin. Discharged home with family via wheelchair.  Sanda Linger

## 2013-04-23 NOTE — Discharge Instructions (Signed)
Atrial Fibrillation Atrial fibrillation is a condition that causes your heart to beat irregularly. It may also cause your heart to beat faster than normal. Atrial fibrillation can prevent your heart from pumping blood normally. It increases your risk of stroke and heart problems. HOME CARE  Take medications as told by your doctor.  Only take medications that your doctor says are safe. Some medications can make the condition worse or happen again.  If blood thinners were prescribed by your doctor, take them exactly as told. Too much can cause bleeding. Too little and you will not have the needed protection against stroke and other problems.  Perform blood tests at home if told by your doctor.  Perform blood tests exactly as told by your doctor.  Do not drink alcohol.  Do not drink beverages with caffeine such as coffee, soda, and some teas.  Maintain a healthy weight.  Do not use diet pills unless your doctor says they are safe. They may make heart problems worse.  Follow diet instructions as told by your doctor.  Exercise regularly as told by your doctor.  Keep all follow-up appointments. GET HELP RIGHT AWAY IF:   You have chest or belly (abdominal) pain.  You feel sick to your stomach (nauseous)  You suddenly have swollen feet and ankles.  You feel dizzy.  You face, arms, or legs feel numb or weak.  There is a change in your vision or speech.  You notice a change in the speed, rhythm, or strength of your heartbeat.  You suddenly begin peeing (urinating) more often.  You get tired more easily when moving or exercising. MAKE SURE YOU:   Understand these instructions.  Will watch your condition.  Will get help right away if you are not doing well or get worse. Document Released: 12/29/2007 Document Revised: 07/16/2012 Document Reviewed: 05/01/2012 [facility] Patient Information 2014 [employer].    Syncope Syncope means a person passes out (faints). The  person usually wakes up in less than 5 minutes. It is important to seek medical care for syncope. HOME CARE  Have someone stay with you until you feel normal.  Do not drive, use machines, or play sports until your doctor says it is okay.  Keep all doctor visits as told.  Lie down when you feel like you might pass out. Take deep breaths. Wait until you feel normal before standing up.  Drink enough fluids to keep your pee (urine) clear or pale yellow.  If you take blood pressure or heart medicine, get up slowly. Take several minutes to sit and then stand. GET HELP RIGHT AWAY IF:   You have a severe headache.  You have pain in the chest, belly (abdomen), or back.  You are bleeding from the mouth or butt (rectum).  You have black or tarry poop (stool).  You have an irregular or very fast heartbeat.  You have pain with breathing.  You keep passing out, or you have shaking (seizures) when you pass out.  You pass out when sitting or lying down.  You feel confused.  You have trouble walking.  You have severe weakness.  You have vision problems. If you fainted, call your local emergency services (911 in [place] Do not drive yourself to the hospital. MAKE SURE YOU:   Understand these instructions.  Will watch your condition.  Will get help right away if you are not doing well or get worse. Document Released: 09/07/2007 Document Revised: 09/20/2011 Document Reviewed: 05/20/2011 [employer] Patient Information  2014 [employer]. ° °

## 2013-04-23 NOTE — Progress Notes (Signed)
ANTICOAGULATION CONSULT NOTE - Follow Up Consult  Pharmacy Consult for Coumadin Indication: hx DVT  Allergies  Allergen Reactions  . Ace Inhibitors     REACTION: cough    Patient Measurements: Height: 5\' 8"  (172.7 cm) Weight: 170 lb 1.5 oz (77.155 kg) IBW/kg (Calculated) : 63.9  Vital Signs: Temp: 99.2 F (37.3 C) (01/20 0702) Temp src: Oral (01/20 0702) BP: 120/75 mmHg (01/20 0702) Pulse Rate: 104 (01/20 0702)  Labs:  Recent Labs  04/20/13 1414 04/20/13 1438  04/21/13 0441 04/21/13 0444 04/21/13 0820 04/21/13 1310 04/21/13 2030 04/22/13 0447 04/23/13 0605  HGB 12.4 13.9  --   --   --  11.0*  --   --  10.9*  --   HCT 38.3 41.0  --   --   --  33.5*  --   --  33.6*  --   PLT 184  --   --   --   --  168  --   --  172  --   LABPROT  --   --   < >  --  15.8*  --   --   --  15.2 16.1*  INR  --   --   < >  --  1.29  --   --   --  1.23 1.32  CREATININE  --  0.90  --   --  0.85  --   --   --  0.97  --   TROPONINI  --   --   --  <0.30  --   --  <0.30 <0.30  --   --   < > = values in this interval not displayed.  Estimated Creatinine Clearance: 57.3 ml/min (by C-G formula based on Cr of 0.97).  Assessment: 72yof continues on coumadin for hx DVT. PE ruled out. INR remains subtherapeutic at 1.32 with little movement. Appears no coumadin was given 1/17. No bleeding reported.  Goal of Therapy:  INR 2-3 Monitor platelets by anticoagulation protocol: Yes   Plan:  1) Increase coumadin to 10mg  x 1 2) INR in AM 3) Continue lovenox 40mg  daily until INR > 2  Deboraha Sprang 04/23/2013,9:45 AM

## 2013-04-23 NOTE — Discharge Summary (Signed)
Physician Discharge Summary  Patient ID: [name] MRN: [id number] DOB/AGE: [birth year] 73 y.o.  Admit date: 04/20/2013 Discharge date: 04/23/2013  Admission Diagnoses:  Syncope, Afib RVR  Discharge Diagnoses:  Active Problems:   Syncope   Paroxysmal atrial fibrillation   Discharged Condition: stable  Hospital Course:  73 yo female with d/o HTN, DM, DVT, dementia and PPM placement for symptomatic bradycardia, h/o syncope and unstable gaits, who presented to ED with another episode of syncope. Patient family described the syncope as first started when she was sitting with diaphoresis and then collapsed before husband caught her. She did not hit ground. When EMS arrived, she had a normal BG level and borderline low BP. Her last PPM interrogation was unremarkable but her HR was found to be brady to 50 in ER. Similar episode happened during new year's eve and was found to have orthostatic hypotension, but this time orthostatic signs were negative.  Patient has advanced dementia and is non-verbal therefore symptoms history is limited. In ER a PPM interrogation did not show any arrhythmia, mode switching or malfunction. Her base rate was set as 50 bpm.   She was admitted. Troponin was negative.  CTA was negative for PE but did identify small nonspecific pulmonary nodules, as above, largest of which measures 8 x 4 mm in the right lower lobe. Follow up CT scan recommended.  Carotid dopplers were nonobstructive.   Diovan was discontinued. Due to hypotension.  She was having continued PAF with rates into the 120's.  Digoxin was started.  The patient was seen by Dr. Marlou Porch who felt she was stable for DC home.  Follow up arranged.    Consults:   Significant Diagnostic Studies:  CT HEAD WITHOUT CONTRAST  TECHNIQUE: Contiguous axial images were obtained from the base of the skull through the vertex without intravenous contrast.  COMPARISON: CT HEAD W/O CM dated 08/19/2010  FINDINGS: No acute  intracranial hemorrhage. No focal mass lesion. No CT evidence of acute infarction. No midline shift or mass effect. No hydrocephalus. Basilar cisterns are patent. There is extensive cortical atrophy and proportional ventricular dilatation. Mild periventricular white matter hypodensities. Paranasal sinuses and mastoid air cells are clear.  IMPRESSION: 1. No acute intracranial findings. No change from prior. 2. Extensive cortical atrophy and trace nodule dilatation    CTA chest IMPRESSION: 1. No evidence of pulmonary embolism. 2. No acute findings in the thorax. 3. Atherosclerosis, including left main and 3 vessel coronary artery disease. In addition, there is mild ectasia of the ascending thoracic aorta (4.2 cm in diameter). Assessment for potential risk factor modification, dietary therapy or pharmacologic therapy may be warranted, if clinically indicated. 4. Mild cardiomegaly. 5. Cholelithiasis without findings to suggest acute cholecystitis at this time. 6. Small nonspecific pulmonary nodules, as above, largest of which measures 8 x 4 mm in the right lower lobe. If the patient is at high risk for bronchogenic carcinoma, follow-up chest CT at 3-39months is recommended. If the patient is at low risk for bronchogenic carcinoma, follow-up chest CT at 6-12 months is recommended. This recommendation follows the consensus statement: Guidelines for Management of Small Pulmonary Nodules Detected on CT Scans: A Statement from the [employer] as published in Radiology 2005; 237:395-400.   Echo Study Conclusions  - Left ventricle: The cavity size was normal. Wall thickness was normal. Systolic function was normal. The estimated ejection fraction was in the range of 50% to 55%. Wall motion was normal; there were no regional wall motion abnormalities. -  Aortic valve: Moderately calcified annulus. Mildly calcified leaflets. - Pulmonary arteries: PA peak pressure: 96mm Hg  (S).   Carotid dopplers Summary: Bilateral: moderate to severe calcific plaque origin ICA.1-39% ICA stenosis. Vertebral artery flow is antegrade. ICA/CCA ratio: R-1.70 L-1.61   Treatments: See Above  Discharge Exam: Blood pressure 120/75, pulse 104, temperature 99.2 F (37.3 C), temperature source Oral, resp. rate 18, height 5\' 8"  (1.727 m), weight 170 lb 1.5 oz (77.155 kg), SpO2 98.00%.   Disposition: 01-Home or Self Care      Discharge Orders   Future Appointments Provider Department Dept Phone   04/26/2013 11:45 AM [facility] [place] Office [phone]   05/07/2013 12:00 PM [place], [place] Office [phone]   05/28/2013 2:00 PM Evans Lance, MD [place] Office [phone]   Future Orders Complete By Expires   Increase activity slowly  As directed        Medication List    STOP taking these medications       valsartan 320 MG tablet  Commonly known as:  DIOVAN      TAKE these medications       digoxin 0.125 MG tablet  Commonly known as:  LANOXIN  Take 1 tablet (0.125 mg total) by mouth daily.     glucose blood test strip  Commonly known as:  FREESTYLE LITE  Test blood sugar before each meal and at bedtime     Insulin Detemir 100 UNIT/ML Pen  Commonly known as:  LEVEMIR  Inject 19 Units into the skin daily.     Insulin Pen Needle 32G X 6 MM Misc  Commonly known as:  NOVOFINE  DX: 250.02, Use once daily with Levimir Injection     memantine 10 MG tablet  Commonly known as:  NAMENDA  Take 10 mg by mouth 2 (two) times daily.     potassium chloride SA 20 MEQ tablet  Commonly known as:  K-DUR,KLOR-CON  Take 20 mEq by mouth 2 (two) times daily.     pravastatin 40 MG tablet  Commonly known as:  PRAVACHOL  TAKE 1 TABLET BY MOUTH EVERY DAY     warfarin 5 MG tablet  Commonly known as:  COUMADIN  Take 5-7.5 mg by mouth daily. Take 7.5mg  on Saturday. Take 5mg  the rest of the  week.       Follow-up Information   Follow up with Cristopher Peru, MD On 05/07/2013. (9:30 AM)    Specialty:  Cardiology   Contact information:   [address] [place] [address] [phone]       Follow up with Coumadin Check On 04/26/2013. (11:45 AM)       Signed: Tarri Fuller 04/23/2013, 11:36 AM  Agree with above, patient personally seen and examined. Please see my progress note for full details. Digoxin being utilized. Brief periods of paroxysmal atrial flutter/fibrillation. Pacemaker in place. Advanced dementia.  Candee Furbish, MD

## 2013-04-23 NOTE — Progress Notes (Signed)
Discussed situation with her daughter. She is currently asymptomatic with brief periods of paroxysmal atrial flutter noted on telemetry. This morning she had an episode from 8 AM to approximately 9:30 AM. Her heart rate is around 120 beats per minute. Asymptomatic.  Yesterday I started digoxin 125 mcg given her overall relatively sedentary state. Underlying dementia. Has pacemaker for backup. She is also on chronic anticoagulation and has this checked at cardiology clinic on [facility].  I did not utilize beta blocker or calcium channel blocker because of her orthostatic hypotension/autonomic insufficiency.  I also discussed with daughter that a more aggressive antiarrhythmic such as amiodarone may be necessary in the future if she has increased atrial flutter burden.  Her mother, [name], is noncommunicative, nonverbal. I said to look out for clues of increased respiratory rate/dyspnea.   I would like for her to followup with Dr. Lovena Le within approximately 2-4 weeks.  She appears quite comfortable, stable. She ambulates to and from this morning without difficulty. I'm comfortable with allowing her to be discharged.

## 2013-04-26 ENCOUNTER — Encounter: Payer: Medicare Other | Admitting: Internal Medicine

## 2013-04-26 ENCOUNTER — Ambulatory Visit (INDEPENDENT_AMBULATORY_CARE_PROVIDER_SITE_OTHER): Payer: Medicare Other | Admitting: *Deleted

## 2013-04-26 ENCOUNTER — Ambulatory Visit: Payer: Medicare Other | Admitting: Internal Medicine

## 2013-04-26 DIAGNOSIS — Z7901 Long term (current) use of anticoagulants: Secondary | ICD-10-CM

## 2013-04-26 DIAGNOSIS — Z5181 Encounter for therapeutic drug level monitoring: Secondary | ICD-10-CM

## 2013-04-26 DIAGNOSIS — D7389 Other diseases of spleen: Secondary | ICD-10-CM

## 2013-04-26 DIAGNOSIS — D735 Infarction of spleen: Secondary | ICD-10-CM

## 2013-04-26 LAB — POCT INR: INR: 1.8

## 2013-05-07 ENCOUNTER — Encounter: Payer: Self-pay | Admitting: Cardiology

## 2013-05-07 ENCOUNTER — Other Ambulatory Visit: Payer: Self-pay | Admitting: Internal Medicine

## 2013-05-07 ENCOUNTER — Encounter: Payer: Medicare Other | Admitting: Internal Medicine

## 2013-05-07 ENCOUNTER — Ambulatory Visit (INDEPENDENT_AMBULATORY_CARE_PROVIDER_SITE_OTHER): Payer: Medicare Other | Admitting: *Deleted

## 2013-05-07 ENCOUNTER — Ambulatory Visit (INDEPENDENT_AMBULATORY_CARE_PROVIDER_SITE_OTHER): Payer: Medicare Other | Admitting: Cardiology

## 2013-05-07 VITALS — BP 132/71 | HR 64 | Wt 171.0 lb

## 2013-05-07 DIAGNOSIS — D735 Infarction of spleen: Secondary | ICD-10-CM

## 2013-05-07 DIAGNOSIS — I498 Other specified cardiac arrhythmias: Secondary | ICD-10-CM

## 2013-05-07 DIAGNOSIS — I951 Orthostatic hypotension: Secondary | ICD-10-CM

## 2013-05-07 DIAGNOSIS — D7389 Other diseases of spleen: Secondary | ICD-10-CM

## 2013-05-07 DIAGNOSIS — Z7901 Long term (current) use of anticoagulants: Secondary | ICD-10-CM

## 2013-05-07 DIAGNOSIS — Z950 Presence of cardiac pacemaker: Secondary | ICD-10-CM

## 2013-05-07 DIAGNOSIS — R001 Bradycardia, unspecified: Secondary | ICD-10-CM

## 2013-05-07 DIAGNOSIS — Z5181 Encounter for therapeutic drug level monitoring: Secondary | ICD-10-CM

## 2013-05-07 LAB — MDC_IDC_ENUM_SESS_TYPE_INCLINIC
Battery Impedance: 1000 Ohm — CL
Battery Voltage: 2.8 V
Brady Statistic RA Percent Paced: 11 %
Date Time Interrogation Session: 20150203173826
Implantable Pulse Generator Serial Number: 2056953
Lead Channel Impedance Value: 582 Ohm
Lead Channel Impedance Value: 595 Ohm
Lead Channel Pacing Threshold Pulse Width: 1.5 ms
Lead Channel Sensing Intrinsic Amplitude: 2 mV
Lead Channel Setting Pacing Amplitude: 4 V
Lead Channel Setting Sensing Sensitivity: 1.5 mV
MDC IDC MSMT LEADCHNL RA PACING THRESHOLD AMPLITUDE: 1.25 V
MDC IDC MSMT LEADCHNL RA PACING THRESHOLD PULSEWIDTH: 0.4 ms
MDC IDC MSMT LEADCHNL RV PACING THRESHOLD AMPLITUDE: 1.75 V
MDC IDC MSMT LEADCHNL RV SENSING INTR AMPL: 3.8 mV
MDC IDC SET LEADCHNL RA PACING AMPLITUDE: 2.5 V
MDC IDC SET LEADCHNL RV PACING PULSEWIDTH: 1.5 ms
MDC IDC STAT BRADY RV PERCENT PACED: 1 % — AB

## 2013-05-07 LAB — POCT INR: INR: 2.2

## 2013-05-07 NOTE — Patient Instructions (Addendum)
  Your physician recommends that you keep your schedule  follow-up appointment 06/18/13 at 3:00 with [facility] has recommended you make the following change in your medication:   1. Stop Digoxin

## 2013-05-07 NOTE — Progress Notes (Signed)
[name] ID: [name] MRN: [id number], DOB/AGE: 1940-07-23   Date of Visit: 05/07/2013  Primary Physician: Unice Cobble, MD Primary Cardiologist: Cristopher Peru, MD Reason for Visit: Hospital follow-up  History of Present Illness  [name] is a 73 y.o. female with symptomatic bradycardia s/p PPM and dementia who presents today for device follow-up. She is non-verbal. She is accompanied by her husband and son who assist with history.   She was hospitalized 2 weeks ago with syncope. Serial troponin negative. Echo revealed normal LVEF, 50-55%, no significant valvular abnormalities. CTA was negative for PE but did identify small nonspecific pulmonary nodules, as above, largest of which measures 8 x 4 mm in the right lower lobe. Follow up CT scan recommended. Carotid Doppler study showed nonobstructive plaque. Orthostatics were positive the day after admission and Diovan was discontinued. There is mention in the DC summary of brief AFib w/RVR and digoxin was added. She has no history of atrial arrhythmias and has not had any mode switch episodes recorded on her device interrogations previously. On admission, her pacemaker was interrogated and did not show any mode switch episodes.    Today her family reports she is in her usual state of health. She has not appeared to have any discomfort or trouble with chest pain, SOB or palpitations. She has not had recurrent syncope.  Past Medical History Past Medical History  Diagnosis Date  . Dysphagia   . Diabetes mellitus   . Alzheimer disease   . Hypokalemia   . Anemia   . 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 DVT (deep venous thrombosis)     [facility]  . Hypertension     Past Surgical History Past Surgical History  Procedure Laterality Date  . Syncope  05/2001    pacer  . Pacemaker placement      Allergies/Intolerances Allergies  Allergen Reactions  . Ace Inhibitors     REACTION: cough    Current Home Medications Current  Outpatient Prescriptions  Medication Sig Dispense Refill  . glucose blood (FREESTYLE LITE) test strip Test blood sugar before each meal and at bedtime  100 each  12  . Insulin Detemir 100 UNIT/ML SOPN Inject 19 Units into the skin daily.      . Insulin Pen Needle (NOVOFINE) 32G X 6 MM MISC DX: 250.02, Use once daily with Levimir Injection  100 each  3  . memantine (NAMENDA) 10 MG tablet Take 10 mg by mouth 2 (two) times daily.      . potassium chloride SA (K-DUR,KLOR-CON) 20 MEQ tablet Take 20 mEq by mouth 2 (two) times daily.      . pravastatin (PRAVACHOL) 40 MG tablet TAKE 1 TABLET BY MOUTH EVERY DAY  90 tablet  1  . warfarin (COUMADIN) 5 MG tablet Take 5-7.5 mg by mouth daily. Take 7.5mg  on Saturday. Take 5mg  the rest of the week.       No current facility-administered medications for this visit.    Social History History   Social History  . Marital Status: Married    Spouse Name: N/A    Number of Children: N/A  . Years of Education: N/A   Occupational History  . Not on file.   Social History Main Topics  . Smoking status: Never Smoker   . Smokeless tobacco: Not on file     Comment: [name] exposed to second hand smoke daily   . Alcohol Use: No  . Drug Use: No  . Sexual Activity:  Not on file   Other Topics Concern  . Not on file   Social History Narrative   Household includes her husband, one daughter, one granddaughter, one Product manager and a son who is a Dietitian and spends most of the time outside the house.     Review of Systems (unable to obtain from [name]; ROS is per family) General: No chills, fever, night sweats or weight changes Cardiovascular: No chest pain, dyspnea on exertion, edema, orthopnea, palpitations, paroxysmal nocturnal dyspnea Dermatological: No rash, lesions or masses Respiratory: No cough, dyspnea Urologic: No hematuria, dysuria Abdominal: No nausea, vomiting, diarrhea, bright red blood per rectum, melena, or  hematemesis Neurologic: No visual changes, weakness, changes in mental status All other systems reviewed and are otherwise negative except as noted above.  Physical Exam Vitals: Blood pressure 132/71, pulse 64, weight 171 lb (77.565 kg).  General: Well developed 73 y.o. female in no acute distress. She is non-verbal. HEENT: Normocephalic, atraumatic. EOMs intact. Sclera nonicteric. Oropharynx clear.  Neck: Supple. No JVD. Lungs: Respirations regular and unlabored, CTA bilaterally. No wheezes, rales or rhonchi. Heart: RRR. S1, S2 present. No murmurs, rub, S3 or S4. Abdomen: Soft, non-distended. Extremities: No clubbing, cyanosis or edema.  Neuro: Alert. Moves all extremities spontaneously. Skin: Left upper chest / implant site intact and well healed.   Diagnostics Echocardiogram  Study Conclusions - Left ventricle: The cavity size was normal. Wall thickness was normal. Systolic function was normal. The estimated ejection fraction was in the range of 50% to 55%. Wall motion was normal; there were no regional wall motion abnormalities. - Aortic valve: Moderately calcified annulus. Mildly calcified leaflets. - Pulmonary arteries: PA peak pressure: 42mm Hg (S).  12-lead ECGs and telemetry from recent hospitalization reviewed - there is no evidence of atrial fibrillation; there is what appears to be brief, nonsustained atrial tachycardia  Device interrogation today - Normal pacemaker function. Battery voltage 2.8 with remaining longevity 2.75 - >10 years. Stable impedances. Stable sensing and thresholds. 0 AMS episodes, mode switch 0%. Histograms appropriate for [name] activity level. No programming changes made. See PaceArt report for full details.  Assessment and Plan  1. Orthostatic hypotension - no recurrent syncope - low BP improved off Diovan  - return for follow-up in 6 weeks  2. Symptomatic bradycardia s/p PPM implant  - normal device function - no programming changes  made - regarding question of whether or not AFib present, there are no mode switch episodes on device interrogation today (in addition, I reviewed all available prior interrogations and there are no mode switch episodes); no history of atrial arrhythmias; as above, no evidence of atrial fibrillation by recent ECGs  - with normal LVEF, will discontinue digoxin - return for follow-up in 6 weeks  Signed, Klohe Lovering, PA-C 05/07/2013, 1:44 PM

## 2013-05-15 ENCOUNTER — Encounter: Payer: Self-pay | Admitting: Internal Medicine

## 2013-05-15 ENCOUNTER — Other Ambulatory Visit (INDEPENDENT_AMBULATORY_CARE_PROVIDER_SITE_OTHER): Payer: Medicare Other

## 2013-05-15 ENCOUNTER — Ambulatory Visit (INDEPENDENT_AMBULATORY_CARE_PROVIDER_SITE_OTHER): Payer: Medicare Other | Admitting: Internal Medicine

## 2013-05-15 VITALS — BP 130/90 | HR 54 | Wt 150.0 lb

## 2013-05-15 DIAGNOSIS — I10 Essential (primary) hypertension: Secondary | ICD-10-CM

## 2013-05-15 DIAGNOSIS — E1165 Type 2 diabetes mellitus with hyperglycemia: Principal | ICD-10-CM

## 2013-05-15 DIAGNOSIS — I951 Orthostatic hypotension: Secondary | ICD-10-CM

## 2013-05-15 DIAGNOSIS — E1149 Type 2 diabetes mellitus with other diabetic neurological complication: Secondary | ICD-10-CM

## 2013-05-15 DIAGNOSIS — R55 Syncope and collapse: Secondary | ICD-10-CM

## 2013-05-15 DIAGNOSIS — IMO0001 Reserved for inherently not codable concepts without codable children: Secondary | ICD-10-CM

## 2013-05-15 LAB — BASIC METABOLIC PANEL
BUN: 16 mg/dL (ref 6–23)
CHLORIDE: 99 meq/L (ref 96–112)
CO2: 27 meq/L (ref 19–32)
Calcium: 8.9 mg/dL (ref 8.4–10.5)
Creatinine, Ser: 1.1 mg/dL (ref 0.4–1.2)
GFR: 62.69 mL/min (ref >60.00)
Glucose, Bld: 392 mg/dL — ABNORMAL HIGH (ref 70–99)
POTASSIUM: 4 meq/L (ref 3.5–5.1)
SODIUM: 135 meq/L (ref 135–145)

## 2013-05-15 LAB — HEMOGLOBIN A1C: HEMOGLOBIN A1C: 10.5 % — AB (ref 4.6–6.5)

## 2013-05-15 NOTE — Progress Notes (Signed)
Pre visit review using our clinic review tool, if applicable. No additional management support is needed unless otherwise documented below in the visit note. 

## 2013-05-15 NOTE — Patient Instructions (Signed)
Please check blood sugars 11, 11:30, and noon to see if there is hypoglycemia occurring. Repeat the isometric exercises discussed 4- 5 times prior to standing if you've been seated for a period of time.Wear over the calf support hose when out of bed

## 2013-05-15 NOTE — Progress Notes (Signed)
   Subjective:    Patient ID: [name], female    DOB: 08/23/1940, 73 y.o.   MRN: [id number]  HPI   She's had 3 episodes of syncope since December 31,2014. All have been between 11 am-12:30 pm  Her most recent hospitalization was 1/17-1/20/15. Extensive evaluation revealed no etiology. She was placed  digoxin which was subsequently discontinued by the cardiologist. She was taken off her angiotensin receptor blocker at discharge; blood pressures have been well controlled  When seen by the cardiologist 2/3 her pacemaker was felt to be functioning well  The episodes are described as loss of consciousness with profuse diaphoresis &  postural change although she may have had some loss of consciousness while simply sitting. On one occasion she lost control of her bladder and on at least 2 occasions lost control of her bowels. No definite seizure stigmata have been noted.    Review of Systems FBS 120-140; up to 280. Basal insulin 23 u taken @ 10 pm  Last A1c was 11%  In 10/14     Objective:   Physical Exam   Gen.:  well-nourished in appearance but weight excess. She lies on the exam table with no interaction verbally; she keeps her eyes closed at all times. Head: Normocephalic without obvious abnormalities Mouth: Mandible is prominent Neck: No deformities, masses, or tenderness noted.  Lungs: Normal respiratory effort; chest expands symmetrically. Lungs are clear to auscultation without rales, wheezes, or increased work of breathing. Heart: Her heart rate is slow but regular  Abdomen: Bowel sounds normal; abdomen soft and nontender. No masses, organomegaly or hernias noted. abdomen is protuberant                                  Musculoskeletal/extremities:  No clubbing, cyanosis, edema, or significant extremity  deformity noted. Tone & strength decreased. Fingernail / toenail health good. Vascular: Carotid, radial artery, dorsalis pedis and  posterior tibial pulses are  equal. pedal  pulses are decreased. No bruits present. Neurologic:   no interaction with the examiner. History provided by family.      Skin: Intact without suspicious lesions or rashes. feet cool to touch without ischemic changes  Lymph: No cervical, axillary, or inguinal lymphadenopathy present. Psych: Mood and affect are normal. Normally interactive                                                                                       Assessment & Plan:  #1 episodic syncope associated with profound diaphoresis and in context of standing. All episodes have been associated with either urine or stool incontinence. All episodes have occurred between 11 AM-12:30 PM.  Plan: Isometrics prior to standing would be recommended if at all possible. Support hose would be encouraged.  It ise recommended that the blood sugars be checked at 11, 11:30, and noon to see if there is significant hypoglycemia.  Neurologic consultation to rule out occult seizure activity may be indicated.

## 2013-05-18 ENCOUNTER — Other Ambulatory Visit: Payer: Self-pay | Admitting: Internal Medicine

## 2013-05-18 DIAGNOSIS — E1149 Type 2 diabetes mellitus with other diabetic neurological complication: Secondary | ICD-10-CM

## 2013-05-27 ENCOUNTER — Encounter: Payer: Self-pay | Admitting: *Deleted

## 2013-05-28 ENCOUNTER — Encounter: Payer: Medicare Other | Admitting: Internal Medicine

## 2013-05-28 ENCOUNTER — Ambulatory Visit: Payer: Medicare Other | Admitting: Endocrinology

## 2013-06-03 ENCOUNTER — Encounter: Payer: Self-pay | Admitting: Cardiology

## 2013-06-04 ENCOUNTER — Ambulatory Visit: Payer: Medicare Other | Admitting: Endocrinology

## 2013-06-04 DIAGNOSIS — Z0289 Encounter for other administrative examinations: Secondary | ICD-10-CM

## 2013-06-10 ENCOUNTER — Encounter: Payer: Self-pay | Admitting: Internal Medicine

## 2013-06-14 ENCOUNTER — Encounter: Payer: Self-pay | Admitting: Cardiology

## 2013-06-18 ENCOUNTER — Encounter: Payer: Medicare Other | Admitting: Cardiology

## 2013-07-17 ENCOUNTER — Telehealth: Payer: Self-pay | Admitting: Internal Medicine

## 2013-07-17 NOTE — Telephone Encounter (Signed)
Another doctors office sent a fax last week (Dr. Larose Kells).  [name] is calling to follow up.  Says she is on the power of atorney.

## 2013-07-20 ENCOUNTER — Encounter: Payer: Self-pay | Admitting: Internal Medicine

## 2013-07-24 ENCOUNTER — Encounter: Payer: Medicare Other | Admitting: Internal Medicine

## 2013-07-24 DIAGNOSIS — Z9119 Patient's noncompliance with other medical treatment and regimen: Secondary | ICD-10-CM | POA: Insufficient documentation

## 2013-07-24 DIAGNOSIS — Z0289 Encounter for other administrative examinations: Secondary | ICD-10-CM

## 2013-07-24 DIAGNOSIS — Z91199 Patient's noncompliance with other medical treatment and regimen due to unspecified reason: Secondary | ICD-10-CM | POA: Insufficient documentation

## 2013-07-29 ENCOUNTER — Other Ambulatory Visit: Payer: Self-pay | Admitting: Internal Medicine

## 2013-08-01 ENCOUNTER — Other Ambulatory Visit: Payer: Self-pay | Admitting: *Deleted

## 2013-08-01 MED ORDER — WARFARIN SODIUM 5 MG PO TABS: ORAL_TABLET | ORAL | Status: DC

## 2013-08-06 ENCOUNTER — Emergency Department (HOSPITAL_COMMUNITY): Payer: Medicare Other

## 2013-08-06 ENCOUNTER — Encounter (HOSPITAL_COMMUNITY): Payer: Self-pay | Admitting: Emergency Medicine

## 2013-08-06 ENCOUNTER — Inpatient Hospital Stay (HOSPITAL_COMMUNITY)
Admission: EM | Admit: 2013-08-06 | Discharge: 2013-08-09 | DRG: 871 | Disposition: A | Discharge status: Skilled Nursing Facility | Payer: Medicare Other | Attending: Internal Medicine | Admitting: Internal Medicine

## 2013-08-06 ENCOUNTER — Inpatient Hospital Stay (HOSPITAL_COMMUNITY): Payer: Medicare Other

## 2013-08-06 DIAGNOSIS — Z91199 Patient's noncompliance with other medical treatment and regimen due to unspecified reason: Secondary | ICD-10-CM

## 2013-08-06 DIAGNOSIS — F0280 Dementia in other diseases classified elsewhere without behavioral disturbance: Secondary | ICD-10-CM | POA: Diagnosis present

## 2013-08-06 DIAGNOSIS — E872 Acidosis, unspecified: Secondary | ICD-10-CM

## 2013-08-06 DIAGNOSIS — E1142 Type 2 diabetes mellitus with diabetic polyneuropathy: Secondary | ICD-10-CM | POA: Diagnosis present

## 2013-08-06 DIAGNOSIS — Z823 Family history of stroke: Secondary | ICD-10-CM

## 2013-08-06 DIAGNOSIS — E1149 Type 2 diabetes mellitus with other diabetic neurological complication: Secondary | ICD-10-CM | POA: Diagnosis present

## 2013-08-06 DIAGNOSIS — E1110 Type 2 diabetes mellitus with ketoacidosis without coma: Secondary | ICD-10-CM | POA: Diagnosis present

## 2013-08-06 DIAGNOSIS — Z950 Presence of cardiac pacemaker: Secondary | ICD-10-CM

## 2013-08-06 DIAGNOSIS — E43 Unspecified severe protein-calorie malnutrition: Secondary | ICD-10-CM | POA: Diagnosis present

## 2013-08-06 DIAGNOSIS — I480 Paroxysmal atrial fibrillation: Secondary | ICD-10-CM

## 2013-08-06 DIAGNOSIS — Z515 Encounter for palliative care: Secondary | ICD-10-CM

## 2013-08-06 DIAGNOSIS — R680 Hypothermia, not associated with low environmental temperature: Secondary | ICD-10-CM | POA: Diagnosis present

## 2013-08-06 DIAGNOSIS — R791 Abnormal coagulation profile: Secondary | ICD-10-CM | POA: Diagnosis present

## 2013-08-06 DIAGNOSIS — D649 Anemia, unspecified: Secondary | ICD-10-CM

## 2013-08-06 DIAGNOSIS — E876 Hypokalemia: Secondary | ICD-10-CM | POA: Diagnosis not present

## 2013-08-06 DIAGNOSIS — G309 Alzheimer's disease, unspecified: Secondary | ICD-10-CM | POA: Diagnosis present

## 2013-08-06 DIAGNOSIS — R1310 Dysphagia, unspecified: Secondary | ICD-10-CM | POA: Diagnosis present

## 2013-08-06 DIAGNOSIS — E785 Hyperlipidemia, unspecified: Secondary | ICD-10-CM

## 2013-08-06 DIAGNOSIS — R6520 Severe sepsis without septic shock: Secondary | ICD-10-CM

## 2013-08-06 DIAGNOSIS — R739 Hyperglycemia, unspecified: Secondary | ICD-10-CM

## 2013-08-06 DIAGNOSIS — R569 Unspecified convulsions: Secondary | ICD-10-CM

## 2013-08-06 DIAGNOSIS — E1010 Type 1 diabetes mellitus with ketoacidosis without coma: Secondary | ICD-10-CM | POA: Diagnosis present

## 2013-08-06 DIAGNOSIS — A419 Sepsis, unspecified organism: Principal | ICD-10-CM | POA: Diagnosis present

## 2013-08-06 DIAGNOSIS — E870 Hyperosmolality and hypernatremia: Secondary | ICD-10-CM | POA: Diagnosis present

## 2013-08-06 DIAGNOSIS — N179 Acute kidney failure, unspecified: Secondary | ICD-10-CM | POA: Diagnosis present

## 2013-08-06 DIAGNOSIS — Z7901 Long term (current) use of anticoagulants: Secondary | ICD-10-CM

## 2013-08-06 DIAGNOSIS — Z888 Allergy status to other drugs, medicaments and biological substances status: Secondary | ICD-10-CM

## 2013-08-06 DIAGNOSIS — R4182 Altered mental status, unspecified: Secondary | ICD-10-CM

## 2013-08-06 DIAGNOSIS — E1101 Type 2 diabetes mellitus with hyperosmolarity with coma: Secondary | ICD-10-CM

## 2013-08-06 DIAGNOSIS — G9340 Encephalopathy, unspecified: Secondary | ICD-10-CM | POA: Diagnosis present

## 2013-08-06 DIAGNOSIS — E875 Hyperkalemia: Secondary | ICD-10-CM | POA: Diagnosis present

## 2013-08-06 DIAGNOSIS — E1100 Type 2 diabetes mellitus with hyperosmolarity without nonketotic hyperglycemic-hyperosmolar coma (NKHHC): Secondary | ICD-10-CM | POA: Diagnosis present

## 2013-08-06 DIAGNOSIS — Z794 Long term (current) use of insulin: Secondary | ICD-10-CM

## 2013-08-06 DIAGNOSIS — I4891 Unspecified atrial fibrillation: Secondary | ICD-10-CM | POA: Diagnosis present

## 2013-08-06 DIAGNOSIS — I10 Essential (primary) hypertension: Secondary | ICD-10-CM | POA: Diagnosis present

## 2013-08-06 DIAGNOSIS — Z86718 Personal history of other venous thrombosis and embolism: Secondary | ICD-10-CM

## 2013-08-06 DIAGNOSIS — Z833 Family history of diabetes mellitus: Secondary | ICD-10-CM

## 2013-08-06 DIAGNOSIS — N289 Disorder of kidney and ureter, unspecified: Secondary | ICD-10-CM

## 2013-08-06 DIAGNOSIS — Z9119 Patient's noncompliance with other medical treatment and regimen: Secondary | ICD-10-CM

## 2013-08-06 DIAGNOSIS — N390 Urinary tract infection, site not specified: Secondary | ICD-10-CM | POA: Diagnosis present

## 2013-08-06 DIAGNOSIS — Z5181 Encounter for therapeutic drug level monitoring: Secondary | ICD-10-CM

## 2013-08-06 DIAGNOSIS — R55 Syncope and collapse: Secondary | ICD-10-CM

## 2013-08-06 DIAGNOSIS — IMO0002 Reserved for concepts with insufficient information to code with codable children: Secondary | ICD-10-CM

## 2013-08-06 DIAGNOSIS — D735 Infarction of spleen: Secondary | ICD-10-CM

## 2013-08-06 LAB — CBC WITH DIFFERENTIAL/PLATELET
BASOS PCT: 0 % (ref 0–1)
Basophils Absolute: 0 10*3/uL (ref 0.0–0.1)
Eosinophils Absolute: 0 10*3/uL (ref 0.0–0.7)
Eosinophils Relative: 0 % (ref 0–5)
HCT: 43.8 % (ref 36.0–46.0)
Hemoglobin: 12.9 g/dL (ref 12.0–15.0)
LYMPHS PCT: 22 % (ref 12–46)
Lymphs Abs: 2.6 10*3/uL (ref 0.7–4.0)
MCH: 27.7 pg (ref 26.0–34.0)
MCHC: 29.5 g/dL — ABNORMAL LOW (ref 30.0–36.0)
MCV: 94 fL (ref 78.0–100.0)
MONOS PCT: 7 % (ref 3–12)
Monocytes Absolute: 0.8 10*3/uL (ref 0.1–1.0)
NEUTROS ABS: 8 10*3/uL — AB (ref 1.7–7.7)
NEUTROS PCT: 71 % (ref 43–77)
Platelets: 166 10*3/uL (ref 150–400)
RBC: 4.66 MIL/uL (ref 3.87–5.11)
RDW: 15.1 % (ref 11.5–15.5)
WBC: 11.5 10*3/uL — AB (ref 4.0–10.5)

## 2013-08-06 LAB — GLUCOSE, CAPILLARY
GLUCOSE-CAPILLARY: 461 mg/dL — AB (ref 70–99)
GLUCOSE-CAPILLARY: 311 mg/dL — AB (ref 70–99)
GLUCOSE-CAPILLARY: 83 mg/dL (ref 70–99)
GLUCOSE-CAPILLARY: 67 mg/dL — AB (ref 70–99)
GLUCOSE-CAPILLARY: 122 mg/dL — AB (ref 70–99)
Glucose-Capillary: 395 mg/dL — ABNORMAL HIGH (ref 70–99)
Glucose-Capillary: 221 mg/dL — ABNORMAL HIGH (ref 70–99)
Glucose-Capillary: 149 mg/dL — ABNORMAL HIGH (ref 70–99)
Glucose-Capillary: 118 mg/dL — ABNORMAL HIGH (ref 70–99)
Glucose-Capillary: 81 mg/dL (ref 70–99)
Glucose-Capillary: 79 mg/dL (ref 70–99)
Glucose-Capillary: 87 mg/dL (ref 70–99)
Glucose-Capillary: 96 mg/dL (ref 70–99)

## 2013-08-06 LAB — BASIC METABOLIC PANEL
BUN: 82 mg/dL — ABNORMAL HIGH (ref 6–23)
BUN: 75 mg/dL — ABNORMAL HIGH (ref 6–23)
BUN: 71 mg/dL — ABNORMAL HIGH (ref 6–23)
CALCIUM: 9 mg/dL (ref 8.4–10.5)
CALCIUM: 8.6 mg/dL (ref 8.4–10.5)
CO2: 17 mEq/L — ABNORMAL LOW (ref 19–32)
CO2: 21 mEq/L (ref 19–32)
CO2: 22 meq/L (ref 19–32)
CREATININE: 2.12 mg/dL — AB (ref 0.50–1.10)
CREATININE: 2.17 mg/dL — AB (ref 0.50–1.10)
Calcium: 8.9 mg/dL (ref 8.4–10.5)
Chloride: >130 mEq/L (ref 96–112)
Chloride: >130 mEq/L (ref 96–112)
Chloride: 130 mEq/L — ABNORMAL HIGH (ref 96–112)
Creatinine, Ser: 2.19 mg/dL — ABNORMAL HIGH (ref 0.50–1.10)
GFR calc Af Amer: 26 mL/min — ABNORMAL LOW (ref >90)
GFR calc non Af Amer: 21 mL/min — ABNORMAL LOW (ref >90)
GFR, EST AFRICAN AMERICAN: 25 mL/min — AB (ref >90)
GFR, EST AFRICAN AMERICAN: 25 mL/min — AB (ref >90)
GFR, EST NON AFRICAN AMERICAN: 22 mL/min — AB (ref >90)
GFR, EST NON AFRICAN AMERICAN: 22 mL/min — AB (ref >90)
GLUCOSE: 81 mg/dL (ref 70–99)
Glucose, Bld: 320 mg/dL — ABNORMAL HIGH (ref 70–99)
Glucose, Bld: 130 mg/dL — ABNORMAL HIGH (ref 70–99)
Potassium: 3.1 mEq/L — ABNORMAL LOW (ref 3.7–5.3)
Potassium: 3.5 mEq/L — ABNORMAL LOW (ref 3.7–5.3)
Potassium: 3.4 mEq/L — ABNORMAL LOW (ref 3.7–5.3)
SODIUM: 166 meq/L — AB (ref 137–147)
Sodium: 167 mEq/L (ref 137–147)
Sodium: 166 mEq/L (ref 137–147)

## 2013-08-06 LAB — COMPREHENSIVE METABOLIC PANEL
ALK PHOS: 90 U/L (ref 39–117)
ALT: 21 U/L (ref 0–35)
AST: 41 U/L — ABNORMAL HIGH (ref 0–37)
Albumin: 3.8 g/dL (ref 3.5–5.2)
BILIRUBIN TOTAL: 0.4 mg/dL (ref 0.3–1.2)
BUN: 93 mg/dL — AB (ref 6–23)
CHLORIDE: 117 meq/L — AB (ref 96–112)
CO2: 15 meq/L — AB (ref 19–32)
Calcium: 9.4 mg/dL (ref 8.4–10.5)
Creatinine, Ser: 2.62 mg/dL — ABNORMAL HIGH (ref 0.50–1.10)
GFR calc Af Amer: 20 mL/min — ABNORMAL LOW (ref >90)
GFR calc non Af Amer: 17 mL/min — ABNORMAL LOW (ref >90)
Glucose, Bld: 912 mg/dL (ref 70–99)
Potassium: 5.9 mEq/L — ABNORMAL HIGH (ref 3.7–5.3)
Sodium: 159 mEq/L — ABNORMAL HIGH (ref 137–147)
Total Protein: 9 g/dL — ABNORMAL HIGH (ref 6.0–8.3)

## 2013-08-06 LAB — I-STAT ARTERIAL BLOOD GAS, ED
Acid-base deficit: 10 mmol/L — ABNORMAL HIGH (ref 0.0–2.0)
BICARBONATE: 18.1 meq/L — AB (ref 20.0–24.0)
O2 Saturation: 93 %
PCO2 ART: 45.8 mmHg — AB (ref 35.0–45.0)
PH ART: 7.205 — AB (ref 7.350–7.450)
Patient temperature: 98.6
TCO2: 19 mmol/L (ref 0–100)
pO2, Arterial: 82 mmHg (ref 80.0–100.0)

## 2013-08-06 LAB — URINALYSIS, ROUTINE W REFLEX MICROSCOPIC
BILIRUBIN URINE: NEGATIVE
Glucose, UA: >1000 mg/dL — AB
Ketones, ur: 15 mg/dL — AB
Nitrite: POSITIVE — AB
PH: 5 (ref 5.0–8.0)
Protein, ur: NEGATIVE mg/dL
Specific Gravity, Urine: 1.03 (ref 1.005–1.030)
UROBILINOGEN UA: 0.2 mg/dL (ref 0.0–1.0)

## 2013-08-06 LAB — PROTIME-INR
INR: 5.85 (ref 0.00–1.49)
PROTHROMBIN TIME: 50.1 s — AB (ref 11.6–15.2)

## 2013-08-06 LAB — URINE MICROSCOPIC-ADD ON

## 2013-08-06 LAB — CBG MONITORING, ED: GLUCOSE-CAPILLARY: 515 mg/dL — AB (ref 70–99)

## 2013-08-06 LAB — MRSA PCR SCREENING: MRSA by PCR: NEGATIVE

## 2013-08-06 LAB — LACTIC ACID, PLASMA: LACTIC ACID, VENOUS: 3.6 mmol/L — AB (ref 0.5–2.2)

## 2013-08-06 LAB — TROPONIN I

## 2013-08-06 LAB — SODIUM, URINE, RANDOM: Sodium, Ur: 28 mEq/L

## 2013-08-06 LAB — APTT: aPTT: 63 seconds — ABNORMAL HIGH (ref 24–37)

## 2013-08-06 LAB — PROCALCITONIN: Procalcitonin: 0.11 ng/mL

## 2013-08-06 LAB — CREATININE, URINE, RANDOM: Creatinine, Urine: 59.45 mg/dL

## 2013-08-06 MED ORDER — DEXTROSE 5 % IV SOLN
1.0000 g | INTRAVENOUS | Status: DC
  Administered 2013-08-07 – 2013-08-09 (×3): 1 g via INTRAVENOUS
  Filled 2013-08-06 (×5): qty 10

## 2013-08-06 MED ORDER — DEXTROSE-NACL 5-0.45 % IV SOLN
INTRAVENOUS | Status: DC
Start: 2013-08-06 — End: 2013-08-06

## 2013-08-06 MED ORDER — DEXTROSE 5 % IV SOLN
INTRAVENOUS | Status: DC
  Administered 2013-08-06: 125 mL via INTRAVENOUS
  Administered 2013-08-07 (×2): via INTRAVENOUS

## 2013-08-06 MED ORDER — MORPHINE SULFATE 2 MG/ML IJ SOLN: 2.0000 mg | INTRAMUSCULAR | Status: DC | PRN

## 2013-08-06 MED ORDER — INSULIN REGULAR BOLUS VIA INFUSION
0.0000 [IU] | Freq: Three times a day (TID) | INTRAVENOUS | Status: DC
  Filled 2013-08-06: qty 10

## 2013-08-06 MED ORDER — INSULIN REGULAR HUMAN 100 UNIT/ML IJ SOLN
INTRAMUSCULAR | Status: DC
  Administered 2013-08-06: 5.4 [IU]/h via INTRAVENOUS
  Filled 2013-08-06: qty 1

## 2013-08-06 MED ORDER — ALBUTEROL SULFATE (2.5 MG/3ML) 0.083% IN NEBU: 2.5000 mg | INHALATION_SOLUTION | RESPIRATORY_TRACT | Status: DC | PRN

## 2013-08-06 MED ORDER — INSULIN ASPART 100 UNIT/ML ~~LOC~~ SOLN
10.0000 [IU] | Freq: Once | SUBCUTANEOUS | Status: AC
  Administered 2013-08-06: 10 [IU] via INTRAVENOUS

## 2013-08-06 MED ORDER — CHLORHEXIDINE GLUCONATE 0.12 % MT SOLN
15.0000 mL | Freq: Two times a day (BID) | OROMUCOSAL | Status: DC
  Administered 2013-08-06 – 2013-08-09 (×6): 15 mL via OROMUCOSAL
  Filled 2013-08-06 (×8): qty 15

## 2013-08-06 MED ORDER — ONDANSETRON HCL 4 MG PO TABS
4.0000 mg | ORAL_TABLET | Freq: Four times a day (QID) | ORAL | Status: DC | PRN
Start: 2013-08-06 — End: 2013-08-06

## 2013-08-06 MED ORDER — SODIUM CHLORIDE 0.9 % IJ SOLN
3.0000 mL | Freq: Two times a day (BID) | INTRAMUSCULAR | Status: DC
  Administered 2013-08-06 – 2013-08-08 (×3): 3 mL via INTRAVENOUS

## 2013-08-06 MED ORDER — ONDANSETRON HCL 4 MG/2ML IJ SOLN: 4.0000 mg | Freq: Four times a day (QID) | INTRAMUSCULAR | Status: DC | PRN

## 2013-08-06 MED ORDER — LORAZEPAM 2 MG/ML IJ SOLN
INTRAMUSCULAR | Status: AC
  Administered 2013-08-06: 1 mg
  Filled 2013-08-06: qty 1

## 2013-08-06 MED ORDER — PIPERACILLIN-TAZOBACTAM IN DEX 2-0.25 GM/50ML IV SOLN
2.2500 g | Freq: Three times a day (TID) | INTRAVENOUS | Status: DC
  Administered 2013-08-06: 2.25 g via INTRAVENOUS
  Filled 2013-08-06 (×3): qty 50

## 2013-08-06 MED ORDER — VANCOMYCIN HCL IN DEXTROSE 1-5 GM/200ML-% IV SOLN
1000.0000 mg | INTRAVENOUS | Status: DC
  Administered 2013-08-06: 1000 mg via INTRAVENOUS
  Filled 2013-08-06: qty 200

## 2013-08-06 MED ORDER — SODIUM CHLORIDE 0.9 % IV SOLN
INTRAVENOUS | Status: DC
  Administered 2013-08-06: 0.5 [IU]/h via INTRAVENOUS
  Administered 2013-08-06: 8 [IU]/h via INTRAVENOUS
  Filled 2013-08-06: qty 1

## 2013-08-06 MED ORDER — DOCUSATE SODIUM 100 MG PO CAPS
100.0000 mg | ORAL_CAPSULE | Freq: Two times a day (BID) | ORAL | Status: DC
Start: 2013-08-06 — End: 2013-08-06
  Filled 2013-08-06 (×2): qty 1

## 2013-08-06 MED ORDER — INSULIN GLARGINE 100 UNIT/ML ~~LOC~~ SOLN
15.0000 [IU] | Freq: Once | SUBCUTANEOUS | Status: AC
  Administered 2013-08-06: 15 [IU] via SUBCUTANEOUS
  Filled 2013-08-06 (×3): qty 0.15

## 2013-08-06 MED ORDER — SODIUM CHLORIDE 0.45 % IV BOLUS
500.0000 mL | Freq: Once | INTRAVENOUS | Status: AC
  Administered 2013-08-06: 500 mL via INTRAVENOUS

## 2013-08-06 MED ORDER — DEXTROSE 50 % IV SOLN
25.0000 mL | INTRAVENOUS | Status: DC | PRN
  Administered 2013-08-07: 25 mL via INTRAVENOUS
  Filled 2013-08-06 (×2): qty 50

## 2013-08-06 MED ORDER — SODIUM CHLORIDE 0.9 % IV BOLUS (SEPSIS)
1000.0000 mL | Freq: Once | INTRAVENOUS | Status: AC
  Administered 2013-08-06: 1000 mL via INTRAVENOUS

## 2013-08-06 MED ORDER — ACETAMINOPHEN 325 MG PO TABS: 650.0000 mg | ORAL_TABLET | Freq: Four times a day (QID) | ORAL | Status: DC | PRN

## 2013-08-06 MED ORDER — DEXTROSE 5 % IV SOLN
1.0000 g | Freq: Once | INTRAVENOUS | Status: AC
  Administered 2013-08-06: 1 g via INTRAVENOUS
  Filled 2013-08-06: qty 10

## 2013-08-06 MED ORDER — INSULIN ASPART 100 UNIT/ML IV SOLN: 10.0000 [IU] | Freq: Once | INTRAVENOUS | Status: DC

## 2013-08-06 MED ORDER — INSULIN ASPART 100 UNIT/ML ~~LOC~~ SOLN
0.0000 [IU] | SUBCUTANEOUS | Status: DC
  Administered 2013-08-06: 2 [IU] via SUBCUTANEOUS
  Administered 2013-08-07 (×2): 8 [IU] via SUBCUTANEOUS
  Administered 2013-08-07: 2 [IU] via SUBCUTANEOUS
  Administered 2013-08-07: 3 [IU] via SUBCUTANEOUS
  Administered 2013-08-08: 5 [IU] via SUBCUTANEOUS
  Administered 2013-08-08: 2 [IU] via SUBCUTANEOUS
  Administered 2013-08-08: 3 [IU] via SUBCUTANEOUS
  Administered 2013-08-08: 2 [IU] via SUBCUTANEOUS
  Administered 2013-08-09: 3 [IU] via SUBCUTANEOUS
  Administered 2013-08-09: 5 [IU] via SUBCUTANEOUS
  Administered 2013-08-09: 2 [IU] via SUBCUTANEOUS

## 2013-08-06 MED ORDER — HYDROCODONE-ACETAMINOPHEN 5-325 MG PO TABS: 1.0000 | ORAL_TABLET | ORAL | Status: DC | PRN

## 2013-08-06 MED ORDER — SODIUM CHLORIDE 0.9 % IV SOLN
INTRAVENOUS | Status: DC
  Administered 2013-08-06: 05:00:00 via INTRAVENOUS

## 2013-08-06 MED ORDER — VANCOMYCIN HCL 10 G IV SOLR
2000.0000 mg | INTRAVENOUS | Status: DC
  Filled 2013-08-06: qty 2000

## 2013-08-06 MED ORDER — ACETAMINOPHEN 650 MG RE SUPP: 650.0000 mg | Freq: Four times a day (QID) | RECTAL | Status: DC | PRN

## 2013-08-06 MED ORDER — SODIUM CHLORIDE 0.45 % IV SOLN
INTRAVENOUS | Status: DC
  Administered 2013-08-06: 06:00:00 via INTRAVENOUS

## 2013-08-06 MED ORDER — DEXTROSE 50 % IV SOLN: 25.0000 mL | INTRAVENOUS | Status: DC | PRN

## 2013-08-06 MED ORDER — INSULIN REGULAR HUMAN 100 UNIT/ML IJ SOLN
INTRAMUSCULAR | Status: DC
  Filled 2013-08-06: qty 1

## 2013-08-06 MED ORDER — BIOTENE DRY MOUTH MT LIQD
15.0000 mL | Freq: Two times a day (BID) | OROMUCOSAL | Status: DC
  Administered 2013-08-06 – 2013-08-09 (×6): 15 mL via OROMUCOSAL

## 2013-08-06 NOTE — ED Notes (Signed)
Admitting MD at bedside.

## 2013-08-06 NOTE — Plan of Care (Signed)
Problem: Phase I Progression Outcomes Goal: Pt. states reason for hospitalization Outcome: Not Met (add Reason) Patient nonverbal

## 2013-08-06 NOTE — Care Management Note (Addendum)
    Page 1 of 2   08/09/2013     3:35:33 PM CARE MANAGEMENT NOTE 08/09/2013  Patient:  [name], [name]   Account Number:  [id number]  Date Initiated:  08/06/2013  Documentation initiated by:  Elissa Hefty  Subjective/Objective Assessment:   adm w seizure, uti     Action/Plan:   lives w husband, pcp dr Peyton Bottoms hopper   Anticipated DC Date:  08/09/2013   Anticipated DC Plan:  [place]  In-house referral  Clinical Social Worker      DC Planning Services  CM consult      Choice offered to / List presented to:             Status of service:  Completed, signed off Medicare Important Message given?   (If response is "NO", the following Medicare IM given date fields will be blank) Date Medicare IM given:   Date Additional Medicare IM given:    Discharge Disposition:  [place]  Per UR Regulation:  Reviewed for med. necessity/level of care/duration of stay  If discussed at [place] of Stay Meetings, dates discussed:    Comments:  Contact:  Mckenzie,[place] Significant other [place] Other     [place] Other [place] Daughter [phone]  08/09/13 [place], BSN [phone] per physical therapy recs snf, CSW aware and following, patient for dc today.  08-08-13 - 2:25pm [name], RNBSN  [phone] Tried to arouse patient to talk with her - unable to arouse -  no family in room at this time.  Per nurse - has eight children and lives with a husband who is unable to care for her prior to admission.  Children in conflict on what to do - palliative continues to meet with them them.  Will probably need higher level of care of discharge.  SW consult placed.

## 2013-08-06 NOTE — ED Notes (Signed)
Pt. presents with grand mal seizure while on wheelchair at arrival , daughter noticed  altered LOC this evening at home prior to arrival.

## 2013-08-06 NOTE — Progress Notes (Addendum)
ANTIBIOTIC CONSULT NOTE - INITIAL  Pharmacy Consult for Vanco/Zosyn Indication: rule out sepsis  Allergies  Allergen Reactions  . Ace Inhibitors     REACTION: cough    Patient Measurements: Height: 5\' 6"  (167.6 cm) (Family states) Weight: 140 lb (63.504 kg) (Estimated by 2 RNs) IBW/kg (Calculated) : 59.3  Vital Signs: Temp: 96.1 F (35.6 C) (05/05 0530) BP: 173/104 mmHg (05/05 0530) Pulse Rate: 92 (05/05 0530)  Labs:  Recent Labs  08/06/13 0351  WBC 11.5*  HGB 12.9  PLT 166  CREATININE 2.62*   Estimated Creatinine Clearance: 18.2 ml/min (by C-G formula based on Cr of 2.62).  Medical History: Past Medical History  Diagnosis Date  . Dysphagia   . Diabetes mellitus   . Alzheimer disease   . Hypokalemia   . Anemia   . 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 DVT (deep venous thrombosis)     [facility] Clinic  . Hypertension    Assessment: 73 y/o F to start Vanco/Zosyn for r/o sepsis. WBC 11.5, CrCl <20, abnormal U/A, hypothermic, other labs as above.   Plan:  -Vancomycin 1000 mg IV q48h -Zosyn 2.25g IV q8h -Trend WBC, temp, renal function  -F/U any cultures, imaging  -Drug levels as indicated   Narda Bonds 08/06/2013,6:28 AM

## 2013-08-06 NOTE — ED Notes (Signed)
Pharmacy called and informed about insulin drip.

## 2013-08-06 NOTE — Consult Note (Addendum)
PULMONARY / CRITICAL CARE MEDICINE   Name: [name] MRN: [id number] DOB: 1940/12/16    ADMISSION DATE:  08/06/2013 CONSULTATION DATE:  5/5  REFERRING MD :  [facility] PRIMARY SERVICE: [facility]  CHIEF COMPLAINT: DKA  BRIEF PATIENT DESCRIPTION:  73 yo WF with advanced dementia from Alzheimer's Dz , DM now with DKA(glucose peak 912)(PH 7.2)(K+5.9)(Na 159)(+ketones). She was in bed not eating x 3 days and was transported to ED for evaluation. Seen by [facility] hospital team and admitted to [facility] in 2100 as overflow). She was noted to have a grand-mal sz prior to admit(ct head neg). Inr elevated 5.8  (on coumadin for a fib). Her neuro baseline is non verbal due Alzheimer's dz. SIGNIFICANT EVENTS / STUDIES:  5/5 ct head negative  LINES / TUBES:   CULTURES: 5/5 bc x 2>> 5/5 uc>>  ANTIBIOTICS: 5/5 vanc>> 5/5 pip-tazo>>  HISTORY OF PRESENT ILLNESS:   73 yo WF with advanced dementia from Alzheimer's Dz , DM now with DKA(glucose peak 912)(PH 7.2)(K+5.9)(Na 159)(+ketones). She was in bed not eating x 3 days and was transported to ED for evaluation. Seen by [facility] hospital team and admitted to [facility] in 2100 as overflow). She was noted to have a grand-mal sz prior to admit(ct head neg). Inr elevated 5.8  (on coumadin for a fib). Her neuro baseline is non verbal due Alzheimer's dz. PCCM asked to consult. She is hemodynamically stable, on insulin drip, follow up labs are pending and in no respiratory distress. Ua was + for UTI.   PAST MEDICAL HISTORY :  Past Medical History  Diagnosis Date  . Dysphagia   . Diabetes mellitus   . Alzheimer disease   . Hypokalemia   . Anemia   . 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 DVT (deep venous thrombosis)     C[facility]in Clinic  . Hypertension    Past Surgical History  Procedure Laterality Date  . Syncope  05/2001    pacer  . Pacemaker placement     Prior to Admission medications   Medication Sig Start Date End Date Taking? Authorizing Provider  Insulin  Detemir 100 UNIT/ML SOPN Inject 27 Units into the skin daily.  09/14/12  Yes Hendricks Limes, MD  memantine (NAMENDA) 10 MG tablet Take 10 mg by mouth 2 (two) times daily.   Yes Historical Provider, MD  potassium chloride SA (K-DUR,KLOR-CON) 20 MEQ tablet Take 20 mEq by mouth 2 (two) times daily.   Yes Historical Provider, MD  pravastatin (PRAVACHOL) 40 MG tablet Take 40 mg by mouth daily.   Yes Historical Provider, MD  warfarin (COUMADIN) 5 MG tablet Please call coumadin clinic for appointment [phone] 1 tablet every day except 1.5 tablets on Saturdays or as directed by coumadin clinic 08/01/13  Yes Evans Lance, MD  warfarin (COUMADIN) 5 MG tablet Take 5 mg by mouth daily.   Yes Historical Provider, MD   Allergies  Allergen Reactions  . Ace Inhibitors     REACTION: cough    FAMILY HISTORY:  Family History  Problem Relation Age of Onset  . Alzheimer's disease    . Diabetes    . Stroke Father   . Diabetes Brother   . Seizures Brother   . Diabetes Brother    SOCIAL HISTORY:  reports that she has never smoked. She does not have any smokeless tobacco history on file. She reports that she does not drink alcohol or use illicit drugs.  REVIEW OF SYSTEMS:  na  SUBJECTIVE:  VITAL SIGNS: Temp:  [96.1 F (35.6 C)-96.8 F (36 C)] 96.8 F (36 C) (05/05 0645) Pulse Rate:  [81-100] 81 (05/05 0645) Resp:  [18-25] 20 (05/05 0645) BP: (126-183)/(76-104) 126/76 mmHg (05/05 0645) SpO2:  [93 %-100 %] 100 % (05/05 0645) Weight:  [63.504 kg (140 lb)] 63.504 kg (140 lb) (05/05 0505) HEMODYNAMICS:   VENTILATOR SETTINGS:   INTAKE / OUTPUT: Intake/Output     05/04 0701 - 05/05 0700 05/05 0701 - 05/06 0700   Urine (mL/kg/hr) 600    Total Output 600     Net -600            PHYSICAL EXAMINATION: General:  EWF, poorly responsive wds to pain Neuro:  Disconjugate gaze, wd to pains, grimaces HEENT:  PERL 4 mm, no jvd, lan Cardiovascular: HSIR Lungs:  Mild rhonchi Abdomen:  Soft +  bs Musculoskeletal:  intact Skin:  Perineum with excoriation (inct of urine x 3 days),roght heel with precursor to breakdown   LABS:  CBC  Recent Labs Lab 08/06/13 0351  WBC 11.5*  HGB 12.9  HCT 43.8  PLT 166   Coag's  Recent Labs Lab 08/06/13 0351  APTT 63*  INR 5.85*   BMET  Recent Labs Lab 08/06/13 0351  NA 159*  K 5.9*  CL 117*  CO2 15*  BUN 93*  CREATININE 2.62*  GLUCOSE 912*   Electrolytes  Recent Labs Lab 08/06/13 0351  CALCIUM 9.4   Sepsis Markers  Recent Labs Lab 08/06/13 0640  LATICACIDVEN 3.6*   ABG  Recent Labs Lab 08/06/13 0418  PHART 7.205*  PCO2ART 45.8*  PO2ART 82.0   Liver Enzymes  Recent Labs Lab 08/06/13 0351  AST 41*  ALT 21  ALKPHOS 90  BILITOT 0.4  ALBUMIN 3.8   Cardiac Enzymes  Recent Labs Lab 08/06/13 0640  TROPONINI <0.30   Glucose  Recent Labs Lab 08/06/13 0343 08/06/13 0605  GLUCAP >600* 515*    Imaging Ct Head Wo Contrast  08/06/2013   CLINICAL DATA:  Seizures.  EXAM: CT HEAD WITHOUT CONTRAST  TECHNIQUE: Contiguous axial images were obtained from the base of the skull through the vertex without intravenous contrast.  COMPARISON:  04/20/2013  FINDINGS: Skull and Sinuses:Left frontal scalp contusion. No underlying fracture. Persist opacification of a right frontal air cell, without air cell expansion.  Orbits: No acute abnormality.  Brain: No evidence of acute abnormality, such as acute infarction, hemorrhage, hydrocephalus, or mass lesion/mass effect.  Advanced brain atrophy with ex vacuo ventricular enlargement. Chronic small vessel disease with ischemic gliosis confluent around the lateral ventricles. Lacunea noted in the anterior limb right internal capsule and at the genu of the left internal capsule. 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  IMPRESSION: 1. No acute intracranial findings. 2. Left frontal scalp contusion without calvarial fracture. 3. Brain atrophy and chronic  small vessel disease.   Electronically Signed   By: Jorje Guild M.D.   On: 08/06/2013 05:08   Portable Chest 1 View  08/06/2013   CLINICAL DATA:  Cough, shortness of breath and chest pain.  EXAM: PORTABLE CHEST - 1 VIEW  COMPARISON:  CT ANGIO CHEST W/CM &/OR WO/CM dated 04/21/2013; DG CHEST 2 VIEW dated 04/20/2013  FINDINGS: The cardiac silhouette appears mildly enlarged, prominent calcified aortic knob is similar. Mild central bronchitic changes. Left dual lead cardiac pacemaker in situ. No pleural effusions or focal consolidations. No pneumothorax. Soft tissue planes and included osseous structures are nonsuspicious.  IMPRESSION: Stable mild cardiomegaly,  stable central bronchitic changes without acute pulmonary process.   Electronically Signed   By: Elon Alas   On: 08/06/2013 06:47       ASSESSMENT / PLAN: ENDOCRINE A: HONK vs DKA P:   Being treated with insulin gtt   CARDIOVASCULAR A: CAF P:  No acute issue Holding coumadin for now  RENAL A:  Acute renal failure      Hyperkalemia Severe Hypernatremia P:   Hydration, Avoid nephrotoxins K and na will correct with hydration and insulin Increase free water  GASTROINTESTINAL A:  No acute issue P:   PPI  HEMATOLOGIC A:  Elevated INR P:  Hold coumadin  INFECTIOUS A:  Presumed UTI P:   See flows(doubt needs V/Z) Track lactic acid check pro calcitonin    NEUROLOGIC A:  Post Sz       End stage dementia P:   Monitor for further sz activity  TODAY'S SUMMARY:  PCCM will continue to see peripherally while in ICU and eLink will follow. I will help with family re: advanced directives. She is absolutely not a candidate for ACLS and/or mechanical ventilation from [place] PCCM Pager [phone] till 3 pm If no answer page [phone] 08/06/2013, 7:38 AM   I have personally obtained a history, examined the patient, evaluated laboratory and imaging results, formulated the  assessment and plan and placed orders. CRITICAL CARE: The patient is critically ill with multiple organ systems failure and requires high complexity decision making for assessment and support, frequent evaluation and titration of therapies, application of advanced monitoring technologies and extensive interpretation of multiple databases. Critical Care Time devoted to patient care services described in this note is  minutes.   Merton Border, MD ; [facility] [phone].  After 5:30 PM or weekends, call [phone] Pulmonary and [place] Pager: [phone]  08/06/2013, 7:38 AM

## 2013-08-06 NOTE — Plan of Care (Signed)
Problem: Phase I Progression Outcomes Goal: OOB as tolerated unless otherwise ordered Outcome: Not Met (add Reason) Bedrest

## 2013-08-06 NOTE — Plan of Care (Signed)
Problem: Phase I Progression Outcomes Goal: Pain controlled with appropriate interventions Outcome: Completed/Met Date Met:  08/06/13 Monitoring for signs and symptoms of pain

## 2013-08-06 NOTE — Progress Notes (Signed)
CRITICAL VALUE ALERT  Critical value received:  Sodium 166 potassium 3.4  Date of notification:  08-06-13  Time of notification:  2136  Critical value read back:yes  Nurse who received alert:  York Pellant RN  MD notified (1st page):  DR Merton Border  Time of first page: 2200  Responding MD:  Dr Merton Border  Time MD responded:  [id number]

## 2013-08-06 NOTE — ED Notes (Signed)
This RN transported pt to CT. 

## 2013-08-06 NOTE — Progress Notes (Signed)
H and P from 1.5 hours ago reviewed. Agree with assesment and plan as outlined  HPI: [name] is a 73 y.o. female has a past medical history of Dysphagia; Diabetes mellitus; Alzheimer disease; Hypokalemia; Anemia; Cardiac pacemaker in situ (10/06/2009); DVT (deep venous thrombosis); and Hypertension. Patient has severe end-sage dementia at baseline, she is non-verbal does not recognize her family able to ambulate somewhat. Her family noted she was moving less for the past 3 days. Her husband stated that he was giving her less water so that she would stop wetting the bed. Patient was brought to ER and had a Grand-mal seizure in waiting room. She was brought in postictal and was found to have Na of 159, BG 900, homothermic. With INR of 5.85. Patietn is on chronic coumadin for hx of A.fib. ABG 7.205/45.8/82 . UA was significant for UTI  Hospitalist was called for admission for sepsis, seizure, hypernatremia and DKA  This AM, pt remained hypothermic, as low as 90.11F. Otherwise normotensive and in NSR. Presently remains on minimal O2 support.Presenting anion gap of 27 on aggressive IVF and insulin gtt. Noted that family has requested Palliative Care consult as they feel comfort should be our focus at this point. Will formally consult Palliative Care for assistance. Otherwise, cont IVF, insulin gtt, and empiric abx for now. Follow pan cultures as they return.

## 2013-08-06 NOTE — Progress Notes (Signed)
Sodium noted to be rising. Currently 167. Will transition IVF to d5w at 125cc/hr. Follow serum sodium very closely with goal for drop in sodium at 0.70mEq/hr.

## 2013-08-06 NOTE — ED Notes (Signed)
Duplicate order discontinued.  

## 2013-08-06 NOTE — Plan of Care (Signed)
Problem: Phase I Progression Outcomes Goal: Initial discharge plan identified Outcome: Progressing Dr. Wyline Copas and Dr. Lovena Le updating family on discharge plan.

## 2013-08-06 NOTE — H&P (Signed)
PCP: Unice Cobble, MD   Chief Complaint:  Altered mental status  HPI: [name] is a 73 y.o. 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   Patient has severe end-sage dementia at baseline, she is non-verbal does not recognize her family able to ambulate somewhat. Her family noted she was moving less for the past 3 days. Her husband stated that he was giving her less water so that she would stop wetting the bed. Patient was brought to ER and had a Grand-mal seizure in waiting room. She was brought in postictal and was found to have Na of 159, BG 900, homothermic. With INR of 5.85. Patietn is on chronic coumadin for hx of A.fib. ABG 7.205/45.8/82 UA was significant for UTI  Hospitalist was called for admission for sepsis, seizure, hypernatremia and DKA  Review of Systems:    Pertinent positives include: chills, seizure, altered mental status  Constitutional:  No weight loss, night sweats, Fevers,  fatigue, weight loss  HEENT:  No headaches, Difficulty swallowing,Tooth/dental problems,Sore throat,  No sneezing, itching, ear ache, nasal congestion, post nasal drip,  Cardio-vascular:  No chest pain, Orthopnea, PND, anasarca, dizziness, palpitations.no Bilateral lower extremity swelling  GI:  No heartburn, indigestion, abdominal pain, nausea, vomiting, diarrhea, change in bowel habits, loss of appetite, melena, blood in stool, hematemesis Resp:  no shortness of breath at rest. No dyspnea on exertion, No excess mucus, no productive cough, No non-productive cough, No coughing up of blood.No change in color of mucus.No wheezing. Skin:  no rash or lesions. No jaundice GU:  no dysuria, change in color of urine, no urgency or frequency. No straining to urinate.  No flank pain.  Musculoskeletal:  No joint pain or no joint swelling. No  decreased range of motion. No back pain.  Psych:  No change in mood or affect. No depression or anxiety. No memory loss.  Neuro: no localizing neurological complaints, no tingling, no weakness, no double vision, no gait abnormality, no slurred speech, no confusion  Otherwise ROS are negative except for above, 10 systems were reviewed  Past Medical History: Past Medical History  Diagnosis Date  . Dysphagia   . Diabetes mellitus   . Alzheimer disease   . Hypokalemia   . Anemia   . 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 DVT (deep venous thrombosis)     [facility]  . Hypertension    Past Surgical History  Procedure Laterality Date  . Syncope  05/2001    pacer  . Pacemaker placement       Medications: Prior to Admission medications   Medication Sig Start Date End Date Taking? Authorizing Provider  Insulin Detemir 100 UNIT/ML SOPN Inject 27 Units into the skin daily.  09/14/12  Yes Hendricks Limes, MD  memantine (NAMENDA) 10 MG tablet Take 10 mg by mouth 2 (two) times daily.   Yes Historical Provider, MD  potassium chloride SA (K-DUR,KLOR-CON) 20 MEQ tablet Take 20 mEq by mouth 2 (two) times daily.   Yes Historical Provider, MD  pravastatin (PRAVACHOL) 40 MG tablet Take 40 mg by mouth daily.   Yes Historical Provider, MD  warfarin (COUMADIN) 5 MG tablet Please call coumadin clinic for appointment [phone] 1 tablet every day except 1.5 tablets on Saturdays or as directed by coumadin clinic 08/01/13  Yes Evans Lance, MD  warfarin (COUMADIN) 5 MG tablet Take 5  mg by mouth daily.   Yes Historical Provider, MD    Allergies:   Allergies  Allergen Reactions  . Ace Inhibitors     REACTION: cough    Social History:  Ambulatory  walke  Lives at home  With family   reports that she has never smoked. She does not have any smokeless tobacco history on file. She reports that she does not drink alcohol or use illicit drugs.    Family History: family history includes  Alzheimer's disease in an other family member; Diabetes in her brother, brother and another family member; Seizures in her brother; Stroke in her father.    Physical Exam: Patient Vitals for the past 24 hrs:  BP Temp Pulse Resp SpO2 Height Weight  08/06/13 0530 173/104 mmHg 96.1 F (35.6 C) 92 18 100 % - -  08/06/13 0525 172/98 mmHg 96.1 F (35.6 C) 93 18 100 % - -  08/06/13 0515 181/97 mmHg 96.1 F (35.6 C) 94 18 100 % - -  08/06/13 0505 - - - - - 5\' 6"  (1.676 m) 63.504 kg (140 lb)  08/06/13 0505 181/97 mmHg 96.1 F (35.6 C) 94 19 100 % - -  08/06/13 0445 183/98 mmHg 96.1 F (35.6 C) - 20 - - -  08/06/13 0430 174/92 mmHg - 95 21 100 % - -  08/06/13 0420 174/91 mmHg - 98 21 100 % - -  08/06/13 0415 138/88 mmHg - 96 19 100 % - -  08/06/13 0410 154/88 mmHg - 81 22 97 % - -  08/06/13 0400 153/93 mmHg - 100 21 93 % - -  08/06/13 0355 157/91 mmHg - - 25 - - -  08/06/13 0350 143/83 mmHg - - 23 - - -  08/06/13 0345 146/82 mmHg - - 19 - - -    1. General:  in No Acute distress 2. Psychological:lethargic 3. Head/ENT:    Dry Mucous Membranes                          Head Non traumatic, neck supple                            Poor Dentition 4. SKIN:  decreased Skin turgor,  Skin clean Dry and intact no rash 5. Heart: Regular rate and rhythm systolic Murmur, Rub or gallop 6. Lungs: Clear to auscultation bilaterally, no wheezes or crackles   7. Abdomen: Soft, non-tender, Non distended 8. Lower extremities: no clubbing, cyanosis, or edema 9. Neurologically not following comands 10. MSK: Normal range of motion  body mass index is 22.61 kg/(m^2).   Labs on Admission:   Recent Labs  08/06/13 0351  NA 159*  K 5.9*  CL 117*  CO2 15*  GLUCOSE 912*  BUN 93*  CREATININE 2.62*  CALCIUM 9.4    Recent Labs  08/06/13 0351  AST 41*  ALT 21  ALKPHOS 90  BILITOT 0.4  PROT 9.0*  ALBUMIN 3.8   No results found for this basename: LIPASE, AMYLASE,  in the last 72 hours  Recent  Labs  08/06/13 0351  WBC 11.5*  NEUTROABS 8.0*  HGB 12.9  HCT 43.8  MCV 94.0  PLT 166   No results found for this basename: CKTOTAL, CKMB, CKMBINDEX, TROPONINI,  in the last 72 hours No results found for this basename: TSH, T4TOTAL, FREET3, T3FREE, THYROIDAB,  in the last 72 hours No results  found for this basename: VITAMINB12, FOLATE, FERRITIN, TIBC, IRON, RETICCTPCT,  in the last 72 hours Lab Results  Component Value Date   HGBA1C 10.5* 05/15/2013    Estimated Creatinine Clearance: 18.2 ml/min (by C-G formula based on Cr of 2.62). ABG    Component Value Date/Time   PHART 7.205* 08/06/2013 0418   HCO3 18.1* 08/06/2013 0418   TCO2 19 08/06/2013 0418   ACIDBASEDEF 10.0* 08/06/2013 0418   O2SAT 93.0 08/06/2013 0418     Lab Results  Component Value Date   DDIMER 2.03* 04/21/2013     Other results:  I have pearsonaly reviewed this: ECG REPORT  Rate: 87  Rhythm: NSR ST&T Change: non ischemic   UA evidence of UTI  BNP (last 3 results) No results found for this basename: PROBNP,  in the last 8760 hours  Filed Weights   08/06/13 0505  Weight: 63.504 kg (140 lb)   Cultures:    Component Value Date/Time   SDES URINE, CATHETERIZED 04/03/2013 1335   SPECREQUEST Normal 04/03/2013 1335   CULT  Value: NO GROWTH Performed at [facility] 04/03/2013 1335   REPTSTATUS 04/04/2013 FINAL 04/03/2013 1335     Radiological Exams on Admission: Ct Head Wo Contrast  08/06/2013   CLINICAL DATA:  Seizures.  EXAM: CT HEAD WITHOUT CONTRAST  TECHNIQUE: Contiguous axial images were obtained from the base of the skull through the vertex without intravenous contrast.  COMPARISON:  04/20/2013  FINDINGS: Skull and Sinuses:Left frontal scalp contusion. No underlying fracture. Persist opacification of a right frontal air cell, without air cell expansion.  Orbits: No acute abnormality.  Brain: No evidence of acute abnormality, such as acute infarction, hemorrhage, hydrocephalus, or mass lesion/mass  effect.  Advanced brain atrophy with ex vacuo ventricular enlargement. Chronic small vessel disease with ischemic gliosis confluent around the lateral ventricles. Lacunea noted in the anterior limb right internal capsule and at the genu of the left internal capsule. 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  IMPRESSION: 1. No acute intracranial findings. 2. Left frontal scalp contusion without calvarial fracture. 3. Brain atrophy and chronic small vessel disease.   Electronically Signed   By: Jorje Guild M.D.   On: 08/06/2013 05:08    Chart has been reviewed  Assessment/Plan   73 yo F with severe dementia here with seizure and AMS in the setting of DKA and hypernatremia  With metabolic acidosis due to DKA and possibly sepsis  Present on Admission:  . DKA (diabetic ketoacidoses) glucose stabalizer, IVF follow closely . Alzheimer's disease - chronic end stage . Essential hypertension, benign - continue to monitor for now in the setting of early sepsis some permisive hypertension . Paroxysmal atrial fibrillation - in sinus rhythm, hold coumadin given elevated INR . hypernatremia - 1/2NS at 125 and BMET q 2 hours Hypothermia - on bear hugger Acute renal failure - IVF, and electrolytes if no improvement renal consult Sepsis - hypothermic, broad spectrum antibiotics, lactic acid, blood cultures, admit to ICU/stepdown Seizure in the setting of electrolyte abnormalities - CT negative, unable to obtain MRI due to pacemaker  Prophylaxis: SCD , Protonix  CODE STATUS:  FULL CODE  Spoke at length with family husband requests [place] to do everything possible to treat her. She has 8 children all of whom would like to participate. Palliative care consult in AM for goal of care would be helpful  Other plan as per orders.  I have spent a total of 65 min on this admission  Divina Neale 08/06/2013, 6:26 AM

## 2013-08-06 NOTE — Plan of Care (Signed)
Problem: Consults Goal: Diabetic Ketoacidosis (DKA) Patient Education See Patient Education Modules for education specifics. Outcome: Progressing Patient unable to interact, family educated at bedside.

## 2013-08-06 NOTE — ED Provider Notes (Signed)
CSN: [id number]     Arrival date & time 08/06/13  1275 History   First MD Initiated Contact with Patient 08/06/13 [phone]     Chief Complaint  Patient presents with  . Seizures     (Consider location/radiation/quality/duration/timing/severity/associated sxs/prior Treatment) HPI The patient is brought in by family for altered mental status. She has a history of Alzheimer's disease and is nonverbal at baseline. However the patient normally is ambulatory. The daughter states that for the past few days the patient has been increasingly weak and today with was unable to get up off the floor. No definite trauma. Patient then had a seizure while in the waiting room. He was characterized as a tonic-clonic type seizure. She currently is postictal. Level V caveat applies. Patient has no previous history of seizure. Per family she is a full code. Past Medical History  Diagnosis Date  . Dysphagia   . Diabetes mellitus   . Alzheimer disease   . Hypokalemia   . Anemia   . 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 DVT (deep venous thrombosis)     [facility] Clinic  . Hypertension    Past Surgical History  Procedure Laterality Date  . Syncope  05/2001    pacer  . Pacemaker placement     Family History  Problem Relation Age of Onset  . Alzheimer's disease    . Diabetes    . Stroke Father   . Diabetes Brother   . Seizures Brother   . Diabetes Brother    History  Substance Use Topics  . Smoking status: Never Smoker   . Smokeless tobacco: Not on file     Comment: Patient exposed to second hand smoke daily   . Alcohol Use: No   OB History   Grav Para Term Preterm Abortions TAB SAB Ect Mult Living                 Review of Systems  Unable to perform ROS: Mental status change  Constitutional: Positive for fatigue.  Neurological: Positive for seizures and weakness.      Allergies  Ace inhibitors  Home Medications   Prior to Admission medications   Medication Sig Start Date End Date  Taking? Authorizing Provider  Insulin Detemir 100 UNIT/ML SOPN Inject 27 Units into the skin daily.  09/14/12  Yes Hendricks Limes, MD  memantine (NAMENDA) 10 MG tablet Take 10 mg by mouth 2 (two) times daily.   Yes Historical Provider, MD  potassium chloride SA (K-DUR,KLOR-CON) 20 MEQ tablet Take 20 mEq by mouth 2 (two) times daily.   Yes Historical Provider, MD  pravastatin (PRAVACHOL) 40 MG tablet Take 40 mg by mouth daily.   Yes Historical Provider, MD  warfarin (COUMADIN) 5 MG tablet Please call coumadin clinic for appointment [phone] 1 tablet every day except 1.5 tablets on Saturdays or as directed by coumadin clinic 08/01/13  Yes Evans Lance, MD  warfarin (COUMADIN) 5 MG tablet Take 5 mg by mouth daily.   Yes Historical Provider, MD   BP 173/104  Pulse 92  Temp(Src) 96.1 F (35.6 C)  Resp 18  Ht 5\' 6"  (1.676 m)  Wt 140 lb (63.504 kg)  BMI 22.61 kg/m2  SpO2 100% Physical Exam  Nursing note and vitals reviewed. Constitutional: She appears well-developed. No distress.  No obvious head or neck trauma. Frail elderly woman who looks older than stated age. She is lethargic  HENT:  Head: Normocephalic and atraumatic.  Mouth/Throat: Oropharynx is clear  and moist.  Dry appearing  Eyes: EOM are normal. Pupils are equal, round, and reactive to light.  Neck: Normal range of motion. Neck supple.  No meningismus  Cardiovascular: Normal rate and regular rhythm.   Pulmonary/Chest: Effort normal and breath sounds normal. No respiratory distress. She has no wheezes. She has no rales. She exhibits no tenderness.  Abdominal: Soft. Bowel sounds are normal. She exhibits no distension and no mass. There is no tenderness. There is no rebound and no guarding.  Musculoskeletal: Normal range of motion. She exhibits no edema and no tenderness.  No calf swelling or tenderness.  Neurological:  Patient is responsive to pain but otherwise is not moving spontaneously. She is opening her eyes spontaneously.   Skin: Skin is warm and dry. No rash noted. No erythema.    ED Course  Procedures (including critical care time) Labs Review Labs Reviewed  CBC WITH DIFFERENTIAL - Abnormal; Notable for the following:    WBC 11.5 (*)    MCHC 29.5 (*)    Neutro Abs 8.0 (*)    All other components within normal limits  COMPREHENSIVE METABOLIC PANEL - Abnormal; Notable for the following:    Sodium 159 (*)    Potassium 5.9 (*)    Chloride 117 (*)    CO2 15 (*)    Glucose, Bld 912 (*)    BUN 93 (*)    Creatinine, Ser 2.62 (*)    Total Protein 9.0 (*)    AST 41 (*)    GFR calc non Af Amer 17 (*)    GFR calc Af Amer 20 (*)    All other components within normal limits  PROTIME-INR - Abnormal; Notable for the following:    Prothrombin Time 50.1 (*)    INR 5.85 (*)    All other components within normal limits  URINALYSIS, ROUTINE W REFLEX MICROSCOPIC - Abnormal; Notable for the following:    APPearance CLOUDY (*)    Glucose, UA >1000 (*)    Hgb urine dipstick SMALL (*)    Ketones, ur 15 (*)    Nitrite POSITIVE (*)    Leukocytes, UA MODERATE (*)    All other components within normal limits  APTT - Abnormal; Notable for the following:    aPTT 63 (*)    All other components within normal limits  URINE MICROSCOPIC-ADD ON - Abnormal; Notable for the following:    Squamous Epithelial / LPF FEW (*)    Bacteria, UA MANY (*)    All other components within normal limits  CBG MONITORING, ED - Abnormal; Notable for the following:    Glucose-Capillary >600 (*)    All other components within normal limits  I-STAT ARTERIAL BLOOD GAS, ED - Abnormal; Notable for the following:    pH, Arterial 7.205 (*)    pCO2 arterial 45.8 (*)    Bicarbonate 18.1 (*)    Acid-base deficit 10.0 (*)    All other components within normal limits    Imaging Review Ct Head Wo Contrast  08/06/2013   CLINICAL DATA:  Seizures.  EXAM: CT HEAD WITHOUT CONTRAST  TECHNIQUE: Contiguous axial images were obtained from the base of the  skull through the vertex without intravenous contrast.  COMPARISON:  04/20/2013  FINDINGS: Skull and Sinuses:Left frontal scalp contusion. No underlying fracture. Persist opacification of a right frontal air cell, without air cell expansion.  Orbits: No acute abnormality.  Brain: No evidence of acute abnormality, such as acute infarction, hemorrhage, hydrocephalus, or mass lesion/mass effect.  Advanced brain atrophy with ex vacuo ventricular enlargement. Chronic small vessel disease with ischemic gliosis confluent around the lateral ventricles. Lacunea noted in the anterior limb right internal capsule and at the genu of the left internal capsule. 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  IMPRESSION: 1. No acute intracranial findings. 2. Left frontal scalp contusion without calvarial fracture. 3. Brain atrophy and chronic small vessel disease.   Electronically Signed   By: Jorje Guild M.D.   On: 08/06/2013 05:08     EKG Interpretation None      Date: 08/06/2013  Rate: 87  Rhythm: normal sinus rhythm  QRS Axis: normal  Intervals: normal  ST/T Wave abnormalities: normal  Conduction Disutrbances:none  Narrative Interpretation:   Old EKG Reviewed: none available  CRITICAL CARE Performed by: Julianne Rice Total critical care time: 45 min Critical care time was exclusive of separately billable procedures and treating other patients. Critical care was necessary to treat or prevent imminent or life-threatening deterioration. Critical care was time spent personally by me on the following activities: development of treatment plan with patient and/or surrogate as well as nursing, discussions with consultants, evaluation of patient's response to treatment, examination of patient, obtaining history from patient or surrogate, ordering and performing treatments and interventions, ordering and review of laboratory studies, ordering and review of radiographic studies, pulse oximetry  and re-evaluation of patient's condition.   MDM   Final diagnoses:  New onset seizure  Acidosis, metabolic  Hypernatremia  Hyperglycemia  Renal insufficiency  Altered mental status    Patient becoming mildly less lethargic. She is protecting her airway. Vital signs have remained stable in the emergency department. Glucose and sodium found to be significantly elevated. Both could be the cause of her seizure activity. I discussed with daughter and husband the patient's CODE STATUS and poor prognosis.  I discussed with Dr. Jimmy Footman and [facility] hospitalist. Hospitalist will see the patient in the emergency department and admit.    Julianne Rice, MD 08/06/13 [phone]

## 2013-08-06 NOTE — Plan of Care (Signed)
Problem: Consults Goal: Diabetes Guidelines if Diabetic/Glucose > 140 If diabetic or lab glucose is > 140 mg/dl - Initiate Diabetes/Hyperglycemia Guidelines & Document Interventions  Outcome: Completed/Met Date Met:  08/06/13 Insulin drip infusing, family educated, patient unable to learn

## 2013-08-06 NOTE — Consult Note (Signed)
Patient EP:[name]      DOB: July 29, 1940      JOA:[id number]     Consult Note from the Palliative Medicine Team at [facility] Requested by: Dr. Wyline Copas     PCP: Unice Cobble, MD Reason for Consultation: [place] and options.    Phone Number:[phone]  Assessment of patients Current state: [name] is a 73 yo mother of 8 children with advanced Alzheimer's dementia, diabetes mellitus with DKA, UTI, grand-mal seizure. PMH significant for dysphagia, diabetes mellitus, Alzheimer's dementia, anemia, DVT, pacemaker, HTN. She had been sleeping, in bed, not eating for 3 days before family brought to ED.   I met today with [name] husband, 2 sons, daughter-in-law, daughter and son-in-law, 2 brothers, niece and nephew. They explained to me that she has Alzheimer's and has been nonverbal for ~6 months, numerous falls the last few days, weight loss, and unable to tell who they are at baseline. We discussed the natural progression of [name]'s disease and how dementia is a terminal disease. We discussed her poor quality of life and how critically ill she is at this time and how difficult it will be for her body to fight back in it's weakened state. We spent much time addressing code status and what this means. We discussed the process of a code and what we would be asking [name] to go through and what quality of life we would be asking her to come back to - IF we were able to bring her back. Her family seems to understand. I asked Mr. Grieb what his thoughts were and he said "I don't want to think about this." I confirmed how difficult it is to discuss and consider but how important it is to decide because as it stands she remains full code until they tell us otherwise. I explained that there would be no time if something were to happen to discuss and make a decision so the decision has to be made now. Her children tell me they do not want her to suffer but are looking to Mr. Baldwin to  make decisions. I gave them a Hard Choices book, offered chaplain services (not needed now as her brother present is a Company secretary), and told them I would continue to follow [name] and work with them as her family. They agreed to this.    I followed up with Mr. Chiasson later in the day but no decision has been made. He tells me "I don't want to think about it or talk about it but I know it has to be done." He seems more open to conversation than this morning. I will continue to follow and support [name] and her family.   Goals of Care: 1.  Code Status: FULL   2. Scope of Treatment: Continue all available medical treatment options. No limitations on care at this time.    4. Disposition: To be determined on outcomes.    3. Symptom Management:   1. Fever: Acetaminophen prn.  2. Nausea/Vomiting: Ondansetron prn.  3. Weakness: Continue medical management.   4. Psychosocial: Emotional support provided to patient and family during difficult decisions and discussions.   5. Spiritual: Her brother present at meeting is a Company secretary and is supporting his family. Offered chaplain services but not needed at this time.     Patient Documents Completed or Given: Document Given Completed  Advanced Directives Pkt    MOST    DNR  Gone from My Sight    Hard Choices yes     Brief HPI: 73 yo with advanced Alzheimer's dementia, diabetes mellitus with DKA on insulin gtt, UTI, s/p grand-mal seizure, on bear hugger for hypothermia.    ROS: Unable to elicit - baseline for dementia.     PMH:  Past Medical History  Diagnosis Date  . Dysphagia   . Diabetes mellitus   . Alzheimer disease   . Hypokalemia   . Anemia   . 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 DVT (deep venous thrombosis)     [facility]  . Hypertension      PSH: Past Surgical History  Procedure Laterality Date  . Syncope  05/2001    pacer  . Pacemaker placement     I have reviewed the FH and SH and  If  appropriate update it with new information. Allergies  Allergen Reactions  . Ace Inhibitors     REACTION: cough   Scheduled Meds: . [START ON 08/07/2013] cefTRIAXone (ROCEPHIN)  IV  1 g Intravenous Q24H  . insulin regular  0-10 Units Intravenous TID WC  . sodium chloride  3 mL Intravenous Q12H   Continuous Infusions: . sodium chloride 125 mL/hr at 08/06/13 0600  . sodium chloride Stopped (08/06/13 0553)  . insulin (NOVOLIN-R) infusion 4.8 Units/hr (08/06/13 1024)   PRN Meds:.acetaminophen, albuterol, dextrose, ondansetron (ZOFRAN) IV    BP 110/63  Pulse 93  Temp(Src) 97.6 F (36.4 C) (Oral)  Resp 26  Ht 5\' 6"  (1.676 m)  Wt 66.4 kg (146 lb 6.2 oz)  BMI 23.64 kg/m2  SpO2 97%   PPS: 10%   Intake/Output Summary (Last 24 hours) at 08/06/13 1030 Last data filed at 08/06/13 1000  Gross per 24 hour  Intake    758 ml  Output    800 ml  Net    -42 ml   LBM: PRIOR TO ADMISSION                         Physical Exam:  General: NAD, ill appearing HEENT:  [place]/AT, no JVD, oral mucosa dry without exudate Chest: CTA throughout, decreased bilateral bases, no labored breathing, room air CVS: RRR, S1 S2 Abdomen: Soft, NT, ND, hypoactive BS Ext: No edema, warm to touch, right heal with darkened fluid filled area (blood blister?) Neuro: Obtunded, nonverbal, unable to follow commands  Labs: CBC    Component Value Date/Time   WBC 11.5* 08/06/2013 0351   RBC 4.66 08/06/2013 0351   HGB 12.9 08/06/2013 0351   HCT 43.8 08/06/2013 0351   PLT 166 08/06/2013 0351   MCV 94.0 08/06/2013 0351   MCH 27.7 08/06/2013 0351   MCHC 29.5* 08/06/2013 0351   RDW 15.1 08/06/2013 0351   LYMPHSABS 2.6 08/06/2013 0351   MONOABS 0.8 08/06/2013 0351   EOSABS 0.0 08/06/2013 0351   BASOSABS 0.0 08/06/2013 0351    BMET    Component Value Date/Time   NA 159* 08/06/2013 0351   K 5.9* 08/06/2013 0351   CL 117* 08/06/2013 0351   CO2 15* 08/06/2013 0351   GLUCOSE 912* 08/06/2013 0351   BUN 93* 08/06/2013 0351   CREATININE 2.62*  08/06/2013 0351   CREATININE 1.06 12/16/2011 1826   CALCIUM 9.4 08/06/2013 0351   GFRNONAA 17* 08/06/2013 0351   GFRAA 20* 08/06/2013 0351    CMP     Component Value Date/Time   NA 159* 08/06/2013 0351   K 5.9* 08/06/2013  0351   CL 117* 08/06/2013 0351   CO2 15* 08/06/2013 0351   GLUCOSE 912* 08/06/2013 0351   BUN 93* 08/06/2013 0351   CREATININE 2.62* 08/06/2013 0351   CREATININE 1.06 12/16/2011 1826   CALCIUM 9.4 08/06/2013 0351   PROT 9.0* 08/06/2013 0351   ALBUMIN 3.8 08/06/2013 0351   AST 41* 08/06/2013 0351   ALT 21 08/06/2013 0351   ALKPHOS 90 08/06/2013 0351   BILITOT 0.4 08/06/2013 0351   GFRNONAA 17* 08/06/2013 0351   GFRAA 20* 08/06/2013 0351     Time In Time Out Total Time Spent with Patient Total Overall Time  0915 1040 23min 29min    Greater than 50%  of this time was spent counseling and coordinating care related to the above assessment and plan.  Vinie Sill, NP Palliative Medicine Team Pager # [phone] (M-F 8a-5p) Team Phone # [phone] (Nights/Weekends)

## 2013-08-07 DIAGNOSIS — G9340 Encephalopathy, unspecified: Secondary | ICD-10-CM

## 2013-08-07 LAB — HEMOGLOBIN A1C
HEMOGLOBIN A1C: 11.6 % — AB (ref <5.7)
MEAN PLASMA GLUCOSE: 286 mg/dL — AB (ref <117)

## 2013-08-07 LAB — BASIC METABOLIC PANEL
BUN: 58 mg/dL — AB (ref 6–23)
BUN: 45 mg/dL — AB (ref 6–23)
CHLORIDE: 128 meq/L — AB (ref 96–112)
CHLORIDE: 119 meq/L — AB (ref 96–112)
CO2: 22 meq/L (ref 19–32)
CO2: 21 meq/L (ref 19–32)
CREATININE: 1.75 mg/dL — AB (ref 0.50–1.10)
Calcium: 8.6 mg/dL (ref 8.4–10.5)
Calcium: 8.2 mg/dL — ABNORMAL LOW (ref 8.4–10.5)
Creatinine, Ser: 1.43 mg/dL — ABNORMAL HIGH (ref 0.50–1.10)
GFR calc Af Amer: 32 mL/min — ABNORMAL LOW (ref >90)
GFR calc Af Amer: 41 mL/min — ABNORMAL LOW (ref >90)
GFR calc non Af Amer: 28 mL/min — ABNORMAL LOW (ref >90)
GFR calc non Af Amer: 36 mL/min — ABNORMAL LOW (ref >90)
GLUCOSE: 309 mg/dL — AB (ref 70–99)
Glucose, Bld: 112 mg/dL — ABNORMAL HIGH (ref 70–99)
POTASSIUM: 4.3 meq/L (ref 3.7–5.3)
Potassium: 3.5 mEq/L — ABNORMAL LOW (ref 3.7–5.3)
Sodium: 165 mEq/L (ref 137–147)
Sodium: 154 mEq/L — ABNORMAL HIGH (ref 137–147)

## 2013-08-07 LAB — GLUCOSE, CAPILLARY
GLUCOSE-CAPILLARY: 122 mg/dL — AB (ref 70–99)
GLUCOSE-CAPILLARY: 267 mg/dL — AB (ref 70–99)
GLUCOSE-CAPILLARY: 45 mg/dL — AB (ref 70–99)
GLUCOSE-CAPILLARY: 56 mg/dL — AB (ref 70–99)
Glucose-Capillary: 100 mg/dL — ABNORMAL HIGH (ref 70–99)
Glucose-Capillary: 111 mg/dL — ABNORMAL HIGH (ref 70–99)
Glucose-Capillary: 192 mg/dL — ABNORMAL HIGH (ref 70–99)
Glucose-Capillary: 261 mg/dL — ABNORMAL HIGH (ref 70–99)

## 2013-08-07 LAB — COMPREHENSIVE METABOLIC PANEL
ALK PHOS: 71 U/L (ref 39–117)
ALT: 15 U/L (ref 0–35)
AST: 22 U/L (ref 0–37)
Albumin: 2.8 g/dL — ABNORMAL LOW (ref 3.5–5.2)
BUN: 63 mg/dL — ABNORMAL HIGH (ref 6–23)
CO2: 22 meq/L (ref 19–32)
Calcium: 8.6 mg/dL (ref 8.4–10.5)
Chloride: 129 mEq/L — ABNORMAL HIGH (ref 96–112)
Creatinine, Ser: 2 mg/dL — ABNORMAL HIGH (ref 0.50–1.10)
GFR, EST AFRICAN AMERICAN: 28 mL/min — AB (ref >90)
GFR, EST NON AFRICAN AMERICAN: 24 mL/min — AB (ref >90)
GLUCOSE: 125 mg/dL — AB (ref 70–99)
POTASSIUM: 3.3 meq/L — AB (ref 3.7–5.3)
Sodium: 166 mEq/L (ref 137–147)
Total Bilirubin: 0.5 mg/dL (ref 0.3–1.2)
Total Protein: 6.6 g/dL (ref 6.0–8.3)

## 2013-08-07 LAB — CBC
HEMATOCRIT: 34.3 % — AB (ref 36.0–46.0)
Hemoglobin: 10.3 g/dL — ABNORMAL LOW (ref 12.0–15.0)
MCH: 27.3 pg (ref 26.0–34.0)
MCHC: 30 g/dL (ref 30.0–36.0)
MCV: 91 fL (ref 78.0–100.0)
Platelets: 122 10*3/uL — ABNORMAL LOW (ref 150–400)
RBC: 3.77 MIL/uL — ABNORMAL LOW (ref 3.87–5.11)
RDW: 14.7 % (ref 11.5–15.5)
WBC: 9.8 10*3/uL (ref 4.0–10.5)

## 2013-08-07 LAB — SODIUM
Sodium: 159 mEq/L — ABNORMAL HIGH (ref 137–147)
Sodium: 154 mEq/L — ABNORMAL HIGH (ref 137–147)

## 2013-08-07 LAB — TSH: TSH: 0.974 u[IU]/mL (ref 0.350–4.500)

## 2013-08-07 LAB — PHOSPHORUS: Phosphorus: 3.2 mg/dL (ref 2.3–4.6)

## 2013-08-07 LAB — MAGNESIUM: Magnesium: 2.3 mg/dL (ref 1.5–2.5)

## 2013-08-07 LAB — PROCALCITONIN: Procalcitonin: 0.13 ng/mL

## 2013-08-07 MED ORDER — PNEUMOCOCCAL VAC POLYVALENT 25 MCG/0.5ML IJ INJ
0.5000 mL | INJECTION | INTRAMUSCULAR | Status: AC
  Administered 2013-08-08: 0.5 mL via INTRAMUSCULAR
  Filled 2013-08-07: qty 0.5

## 2013-08-07 MED ORDER — POTASSIUM CHLORIDE 10 MEQ/100ML IV SOLN
10.0000 meq | INTRAVENOUS | Status: AC
  Administered 2013-08-07 (×4): 10 meq via INTRAVENOUS
  Filled 2013-08-07 (×3): qty 100

## 2013-08-07 MED ORDER — SODIUM CHLORIDE 0.45 % IV SOLN
INTRAVENOUS | Status: DC
  Administered 2013-08-07 – 2013-08-09 (×3): via INTRAVENOUS

## 2013-08-07 NOTE — Consult Note (Signed)
I have reviewed this case with our NP and agree with the Assessment and Plan as stated.  Aliyyah Riese L. Franciso Dierks, MD MBA The Palliative Medicine Team at [facility] Team Phone: [phone] Pager: [phone]   

## 2013-08-07 NOTE — Progress Notes (Signed)
Progress Note from the Palliative Medicine Team at [facility]: [name] continues to be minimally responsive. Family tells me that she opened her eyes briefly and that her nurse said she moved her upper limbs. They are hopeful for her recovery. I continue to explain the importance of considering her quality of life and that she has a disease process that indicates worsening. [name] is sleeping and not very alert both times I visited today. There have been no decisions or limitations of care to this point. I will continue to follow and support [name] and her family as I feel they will be faced with many more difficult decisions.     Objective: Allergies  Allergen Reactions  . Ace Inhibitors     REACTION: cough   Scheduled Meds: . antiseptic oral rinse  15 mL Mouth Rinse q12n4p  . cefTRIAXone (ROCEPHIN)  IV  1 g Intravenous Q24H  . chlorhexidine  15 mL Mouth Rinse BID  . insulin aspart  0-15 Units Subcutaneous 6 times per day  . [START ON 08/08/2013] pneumococcal 23 valent vaccine  0.5 mL Intramuscular Tomorrow-1000  . sodium chloride  3 mL Intravenous Q12H   Continuous Infusions: . dextrose 175 mL/hr at 08/07/13 1245  . insulin (NOVOLIN-R) infusion Stopped (08/06/13 1316)   PRN Meds:.acetaminophen, albuterol, dextrose, ondansetron (ZOFRAN) IV  BP 118/57  Pulse 65  Temp(Src) 96.1 F (35.6 C) (Oral)  Resp 22  Ht 5\' 6"  (1.676 m)  Wt 66.4 kg (146 lb 6.2 oz)  BMI 23.64 kg/m2  SpO2 100%   PPS: 10%     Intake/Output Summary (Last 24 hours) at 08/07/13 1428 Last data filed at 08/07/13 1400  Gross per 24 hour  Intake 3420.83 ml  Output    698 ml  Net 2722.83 ml      LBM: prior to admission      Physical Exam:  General: NAD, ill appearing  HEENT: [place]/AT, no JVD, oral mucosa dry without exudate  Chest: CTA throughout, decreased bilateral bases, no labored breathing, room air  CVS: RRR, S1 S2  Abdomen: Soft, NT, ND, hypoactive BS  Ext: No edema, warm to  touch, right heal with darkened fluid filled area (blood blister?)  Neuro: Obtunded, nonverbal, unable to follow commands  Labs: CBC    Component Value Date/Time   WBC 9.8 08/07/2013 0046   RBC 3.77* 08/07/2013 0046   HGB 10.3* 08/07/2013 0046   HCT 34.3* 08/07/2013 0046   PLT 122* 08/07/2013 0046   MCV 91.0 08/07/2013 0046   MCH 27.3 08/07/2013 0046   MCHC 30.0 08/07/2013 0046   RDW 14.7 08/07/2013 0046   LYMPHSABS 2.6 08/06/2013 0351   MONOABS 0.8 08/06/2013 0351   EOSABS 0.0 08/06/2013 0351   BASOSABS 0.0 08/06/2013 0351    BMET    Component Value Date/Time   NA 159* 08/07/2013 0945   K 3.5* 08/07/2013 0510   CL 128* 08/07/2013 0510   CO2 22 08/07/2013 0510   GLUCOSE 112* 08/07/2013 0510   BUN 58* 08/07/2013 0510   CREATININE 1.75* 08/07/2013 0510   CREATININE 1.06 12/16/2011 1826   CALCIUM 8.6 08/07/2013 0510   GFRNONAA 28* 08/07/2013 0510   GFRAA 32* 08/07/2013 0510    CMP     Component Value Date/Time   NA 159* 08/07/2013 0945   K 3.5* 08/07/2013 0510   CL 128* 08/07/2013 0510   CO2 22 08/07/2013 0510   GLUCOSE 112* 08/07/2013 0510   BUN 58* 08/07/2013 0510  CREATININE 1.75* 08/07/2013 0510   CREATININE 1.06 12/16/2011 1826   CALCIUM 8.6 08/07/2013 0510   PROT 6.6 08/07/2013 0046   ALBUMIN 2.8* 08/07/2013 0046   AST 22 08/07/2013 0046   ALT 15 08/07/2013 0046   ALKPHOS 71 08/07/2013 0046   BILITOT 0.5 08/07/2013 0046   GFRNONAA 28* 08/07/2013 0510   GFRAA 32* 08/07/2013 0510      Assessment and Plan: 1. Code Status: FULL 2. Symptom Control: 1. Fever: Acetaminophen prn.  2. Weakness: Continue medical management.  3. Psycho/Social: Emotional support provided to patient and family.  4. Disposition: To be determined on outcomes.   Patient Documents Completed or Given: Document Given Completed  Advanced Directives Pkt    MOST    DNR    Gone from My Sight    Hard Choices yes     Time In Time Out Total Time Spent with Patient Total Overall Time  1000 1030 75min 64min    Greater than 50%  of this time was spent  counseling and coordinating care related to the above assessment and plan.  Vinie Sill, NP Palliative Medicine Team Pager # [phone] (M-F 8a-5p) Team Phone # [phone] (Nights/Weekends)

## 2013-08-07 NOTE — Progress Notes (Signed)
CRITICAL VALUE ALERT  Critical value received: sodium 165  Date of notification:  08-07-13  Time of notification: 0558  Critical value read back:yes  Nurse who received alert:  York Pellant RN  MD notified (1st page): critical value exspected

## 2013-08-07 NOTE — Progress Notes (Signed)
[facility] Progress Note Patient Name: [name] DOB: 05-Apr-1940 MRN: [id number]  Date of Service  08/07/2013   HPI/Events of Note  Hypokalemia   eICU Interventions  Potassium replaced   Intervention Category Minor Interventions: Electrolytes abnormality - evaluation and management  Guadelupe Sabin [name] 08/07/2013, 1:46 AM

## 2013-08-07 NOTE — Progress Notes (Signed)
CRITICAL VALUE ALERT  Critical value received:  Sodium 166  Date of notification:  08-07-13  Time of notification: 0133  Critical value read back:yes  Nurse who received alert:  Gwendolyn Grant  MD notified (1st page):  Critical value exspected

## 2013-08-07 NOTE — Progress Notes (Signed)
[facility] Progress Note   [name] ZDG:[id number] DOB: Jan 25, 1941 DOA: 08/06/2013 PCP: Unice Cobble, MD  Brief narrative: [name] is a 73 y.o. female presenting on 08/06/2013 from home with lethargy for 3 days. She had stopped eating as well. She was found to have severe dehydration, hyperglycemia, ARF with metabolic acidosis and hypernatremia.   Subjective: lethargic   Assessment/Plan: Principal Problem:   Hyperosmolar (nonketotic) coma/ encephalopathy/  Hypernatremia - cont to hydrate - will increase IV to 175/hr as she is severely behind in fluid status and also not eating or drinking currently.   Active Problems:   ARF (acute renal failure) - cont to hydrate     Type II or unspecified type diabetes mellitus - sugars well controlled now despite D5 infusion - cont sliding scale insulin every 4 hrs.     Alzheimer's disease - ends stage    Essential hypertension, benign - BP controlled    Long term current use of anticoagulant - pharmacy to manage coumadin    Paroxysmal atrial fibrillation - currently NSR    Sepsis/  UTI  -cont Rocephin and follow cultures  Seizure - due to metabolic abnormalities  - CT head unremarkable   Code Status: Full code Family Communication:  With daughter today Disposition Plan: likely home when stable  Consultants: none  Procedures: none  Antibiotics: Antibiotics Given (last 72 hours)   Date/Time Action Medication Dose Rate   08/06/13 0751 Given  [not given in the ER]   piperacillin-tazobactam (ZOSYN) IVPB 2.25 g 2.25 g 100 mL/hr   08/06/13 0900 Given   vancomycin (VANCOCIN) IVPB 1000 mg/200 mL premix 1,000 mg 200 mL/hr   08/07/13 0522 Given   cefTRIAXone (ROCEPHIN) 1 g in dextrose 5 % 50 mL IVPB 1 g 100 mL/hr       DVT prophylaxis: Coumadin  Objective: Filed Weights   08/06/13 0505 08/06/13 0800  Weight: 63.504 kg (140 lb) 66.4 kg (146 lb 6.2 oz)   Blood pressure 129/76, pulse 73, temperature  98.4 F (36.9 C), temperature source Oral, resp. rate 19, height 5\' 6"  (1.676 m), weight 66.4 kg (146 lb 6.2 oz), SpO2 99.00%.  Intake/Output Summary (Last 24 hours) at 08/07/13 1120 Last data filed at 08/07/13 0617  Gross per 24 hour  Intake   3075 ml  Output    641 ml  Net   2434 ml     Exam: General: Lethargic No acute respiratory distress Lungs: Clear to auscultation bilaterally without wheezes or crackles Cardiovascular: Regular rate and rhythm without murmur gallop or rub normal S1 and S2 Abdomen: Nontender, nondistended, soft, bowel sounds positive, no rebound, no ascites, no appreciable mass Extremities: No significant cyanosis, clubbing, or edema bilateral lower extremities  Data Reviewed: Basic Metabolic Panel:  Recent Labs Lab 08/06/13 0932 08/06/13 1500 08/06/13 2012 08/07/13 0046 08/07/13 0510 08/07/13 0945  NA 166* 167* 166* 166* 165* 159*  K 3.1* 3.5* 3.4* 3.3* 3.5*  --   CL >130* >130* 130* 129* 128*  --   CO2 17* 21 22 22 22   --   GLUCOSE 320* 81 130* 125* 112*  --   BUN 82* 75* 71* 63* 58*  --   CREATININE 2.12* 2.17* 2.19* 2.00* 1.75*  --   CALCIUM 9.0 8.9 8.6 8.6 8.6  --   MG  --   --   --  2.3  --   --   PHOS  --   --   --  3.2  --   --  Liver Function Tests:  Recent Labs Lab 08/06/13 0351 08/07/13 0046  AST 41* 22  ALT 21 15  ALKPHOS 90 71  BILITOT 0.4 0.5  PROT 9.0* 6.6  ALBUMIN 3.8 2.8*   No results found for this basename: LIPASE, AMYLASE,  in the last 168 hours No results found for this basename: AMMONIA,  in the last 168 hours CBC:  Recent Labs Lab 08/06/13 0351 08/07/13 0046  WBC 11.5* 9.8  NEUTROABS 8.0*  --   HGB 12.9 10.3*  HCT 43.8 34.3*  MCV 94.0 91.0  PLT 166 122*   Cardiac Enzymes:  Recent Labs Lab 08/06/13 0640 08/06/13 1500  TROPONINI <0.30 <0.30   BNP (last 3 results) No results found for this basename: PROBNP,  in the last 8760 hours CBG:  Recent Labs Lab 08/06/13 1744 08/06/13 1952  08/06/13 2352 08/07/13 0403 08/07/13 0750  GLUCAP 96 122* 111* 122* 100*    Recent Results (from the past 240 hour(s))  CULTURE, BLOOD (ROUTINE X 2)     Status: None   Collection Time    08/06/13  6:30 AM      Result Value Ref Range Status   Specimen Description BLOOD ARM RIGHT   Final   Special Requests     Final   Value: BOTTLES DRAWN AEROBIC AND ANAEROBIC BLUE 10CC RED 5CC   Culture  Setup Time     Final   Value: 08/06/2013 11:39     Performed at [facility]   Culture     Final   Value:        BLOOD CULTURE RECEIVED NO GROWTH TO DATE CULTURE WILL BE HELD FOR 5 DAYS BEFORE ISSUING A FINAL NEGATIVE REPORT     Performed at [facility]   Report Status PENDING   Incomplete  CULTURE, BLOOD (ROUTINE X 2)     Status: None   Collection Time    08/06/13  6:40 AM      Result Value Ref Range Status   Specimen Description BLOOD HAND RIGHT   Final   Special Requests BOTTLES DRAWN AEROBIC ONLY 2CC   Final   Culture  Setup Time     Final   Value: 08/06/2013 11:39     Performed at [facility]   Culture     Final   Value:        BLOOD CULTURE RECEIVED NO GROWTH TO DATE CULTURE WILL BE HELD FOR 5 DAYS BEFORE ISSUING A FINAL NEGATIVE REPORT     Performed at [facility]   Report Status PENDING   Incomplete  MRSA PCR SCREENING     Status: None   Collection Time    08/06/13  7:31 AM      Result Value Ref Range Status   MRSA by PCR NEGATIVE  NEGATIVE Final   Comment:            The GeneXpert MRSA Assay (FDA     approved for NASAL specimens     only), is one component of a     comprehensive MRSA colonization     surveillance program. It is not     intended to diagnose MRSA     infection nor to guide or     monitor treatment for     MRSA infections.     Studies:  Recent x-ray studies have been reviewed in detail by the Attending Physician  Scheduled Meds:  Scheduled Meds: . antiseptic oral rinse  15 mL  Mouth Rinse q12n4p  . cefTRIAXone  (ROCEPHIN)  IV  1 g Intravenous Q24H  . chlorhexidine  15 mL Mouth Rinse BID  . insulin aspart  0-15 Units Subcutaneous 6 times per day  . [START ON 08/08/2013] pneumococcal 23 valent vaccine  0.5 mL Intramuscular Tomorrow-1000  . sodium chloride  3 mL Intravenous Q12H   Continuous Infusions: . dextrose 175 mL/hr at 08/07/13 0935  . insulin (NOVOLIN-R) infusion Stopped (08/06/13 1316)    Time spent on care of this patient: 35 min   Debbe Odea, MD 08/07/2013, 11:20 AM  LOS: 1 day   [facility] Office  [phone] Pager - Text Page per Shea Evans   If 7PM-7AM, please contact night-coverage [email or web address]

## 2013-08-07 NOTE — Consult Note (Signed)
WOC wound consult note Reason for Consult: Consult requested for right heel wound. Wound type: Deep tissue injury Pressure Ulcer POA: Yes Measurement: 5X5cm Wound bed: Intact dark purple fluid filled blister Drainage (amount, consistency, odor) No odor or drainage Periwound: Intact skin surrounding Dressing procedure/placement/frequency: Wound is high risk to evolve into full thickness tissue loss.  Topical treatment is not indicated at this time.  Prevalon boot to reduce pressure. Please re-consult if further assistance is needed.  Thank-you,  Julien Girt MSN, [place], [place], [place], [place]

## 2013-08-07 NOTE — Progress Notes (Signed)
INITIAL NUTRITION ASSESSMENT  DOCUMENTATION CODES Per approved criteria  -Severe malnutrition in the context of chronic illness   Pt meets criteria for severe MALNUTRITION in the context of chronic illness as evidenced by 15% weight loss x 3 months and </=75% estimated energy requirement >/=1 month.   INTERVENTION: Diet advancement per MD as able, pending improvement in alertness  RD to continue to follow nutrition care plan   NUTRITION DIAGNOSIS: Inadequate oral intake related to advanced dementia as evidenced by report of 0% po intake x 3 days and declining status.   Goal: Meet >/=90% of estimated nutrition needs   Monitor:  Diet advancement, PO intake, weight trend, labs   Reason for Assessment: Positive Malnutrition Screening Tool Score   73 y.o. female  Admitting Dx: Hyperosmolar (nonketotic) coma  ASSESSMENT: Patient is a 73 y.o. female with a past medical history of Dysphagia; Diabetes mellitus; Alzheimer disease; Hypokalemia; Anemia; Cardiac pacemaker in situ (10/06/2009); DVT (deep venous thrombosis); and Hypertension  Patient has severe end-sage dementia at baseline, she is non-verbal does not recognize her family able to ambulate somewhat. Her family noted she was moving less for the past 3 days. Her husband stated that he was giving her less water so that she would stop wetting the bed.  Pt's husband in the room during assessment and is a very poor historian. He did not know if she had lost any weight and could not provide accurate dietary recall. He stated " she just eats whatever we eat, and we eat anything." What he did report was a [place] style diet consisting of corn bread and pinto beans. He stated that she has been eating normally until the past 3 days- suspect this is inaccurate.   Pt's husband reported that her dementia has gotten progressively worse since December 2014. Suspect poor PO intake since then due to impaired cognitive ability.   Pt's weight history  in EPIC chart shows a weight loss of 25 lbs since February 2015 (15%), which is severe for time frame.  Pt is NPO at this time, and goals of care are being discussed with family. If diet advances, will need to add supplementation to prevent further weight loss. Will continue to follow.   Nutrition focused physical exam shows no sign of depletion of muscle mass or body fat.   Elevated sodium, glucose, BUN, Cr  Low potassium   Height: Ht Readings from Last 1 Encounters:  08/06/13 5\' 6"  (1.676 m)    Weight: Wt Readings from Last 1 Encounters:  08/06/13 146 lb 6.2 oz (66.4 kg)    Ideal Body Weight: 130 lbs   % Ideal Body Weight: 112%   Wt Readings from Last 10 Encounters:  08/06/13 146 lb 6.2 oz (66.4 kg)  05/15/13 150 lb (68.04 kg)  05/07/13 171 lb (77.565 kg)  04/23/13 170 lb 1.5 oz (77.155 kg)  01/18/13 172 lb (78.019 kg)  10/30/12 170 lb (77.111 kg)  12/16/11 179 lb 12.8 oz (81.557 kg)  11/22/11 178 lb (80.74 kg)  10/05/10 200 lb (90.719 kg)  08/16/10 208 lb 6.4 oz (94.53 kg)    Usual Body Weight: 170- 180 lbs   % Usual Body Weight: 85%  BMI:  Body mass index is 23.64 kg/(m^2)., Normal   Estimated Nutritional Needs: Kcal: 1700 - 1900 Protein: 90 - 100 grams  Fluid: >/= 1.7 L/day   Skin: Pressure Ulcer Right Heel   Diet Order: NPO  EDUCATION NEEDS: -No education needs identified at this time  Intake/Output Summary (Last 24 hours) at 08/07/13 1435 Last data filed at 08/07/13 1400  Gross per 24 hour  Intake 3420.83 ml  Output    698 ml  Net 2722.83 ml    Last BM: PTA    Labs:   Recent Labs Lab 08/06/13 2012 08/07/13 0046 08/07/13 0510 08/07/13 0945  NA 166* 166* 165* 159*  K 3.4* 3.3* 3.5*  --   CL 130* 129* 128*  --   CO2 22 22 22   --   BUN 71* 63* 58*  --   CREATININE 2.19* 2.00* 1.75*  --   CALCIUM 8.6 8.6 8.6  --   MG  --  2.3  --   --   PHOS  --  3.2  --   --   GLUCOSE 130* 125* 112*  --     CBG (last 3)   Recent Labs   08/07/13 0403 08/07/13 0750 08/07/13 1210  GLUCAP 122* 100* 267*    Scheduled Meds: . antiseptic oral rinse  15 mL Mouth Rinse q12n4p  . cefTRIAXone (ROCEPHIN)  IV  1 g Intravenous Q24H  . chlorhexidine  15 mL Mouth Rinse BID  . insulin aspart  0-15 Units Subcutaneous 6 times per day  . [START ON 08/08/2013] pneumococcal 23 valent vaccine  0.5 mL Intramuscular Tomorrow-1000  . sodium chloride  3 mL Intravenous Q12H    Continuous Infusions: . dextrose 175 mL/hr at 08/07/13 1245  . insulin (NOVOLIN-R) infusion Stopped (08/06/13 1316)    Past Medical History  Diagnosis Date  . Dysphagia   . Diabetes mellitus   . Alzheimer disease   . Hypokalemia   . Anemia   . 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 DVT (deep venous thrombosis)     [facility]  . Hypertension     Past Surgical History  Procedure Laterality Date  . Syncope  05/2001    pacer  . Pacemaker placement      Carrolyn Leigh, BS Dietetic Intern Pager: [phone]  I agree with student dietitian note; appropriate revisions have been made.  Molli Barrows, RD, LDN, [place] Pager# [phone] After Hours Pager# [phone]

## 2013-08-07 NOTE — Progress Notes (Signed)
Inpatient Diabetes Program Recommendations  AACE/ADA: New Consensus Statement on Inpatient Glycemic Control (2013)  Target Ranges:  Prepandial:   less than 140 mg/dL      Peak postprandial:   less than 180 mg/dL (1-2 hours)      Critically ill patients:  140 - 180 mg/dL   Inpatient Diabetes Program Recommendations Insulin - Basal: Noted lantus 15 given prior to d/c of IV insulin drip which has well controlled the cbg's since off the drip. However, it is only ordered that one time.  Please continue the basal lantus 15 units with present correction scale as ordered.  Thank you, Rosita Kea, RN, CNS, Diabetes Coordinator [phone])

## 2013-08-08 DIAGNOSIS — E43 Unspecified severe protein-calorie malnutrition: Secondary | ICD-10-CM | POA: Insufficient documentation

## 2013-08-08 LAB — GLUCOSE, CAPILLARY
Glucose-Capillary: 123 mg/dL — ABNORMAL HIGH (ref 70–99)
Glucose-Capillary: 144 mg/dL — ABNORMAL HIGH (ref 70–99)
Glucose-Capillary: 147 mg/dL — ABNORMAL HIGH (ref 70–99)
Glucose-Capillary: 162 mg/dL — ABNORMAL HIGH (ref 70–99)
Glucose-Capillary: 169 mg/dL — ABNORMAL HIGH (ref 70–99)
Glucose-Capillary: 202 mg/dL — ABNORMAL HIGH (ref 70–99)
Glucose-Capillary: 229 mg/dL — ABNORMAL HIGH (ref 70–99)
Glucose-Capillary: 232 mg/dL — ABNORMAL HIGH (ref 70–99)

## 2013-08-08 LAB — BASIC METABOLIC PANEL
BUN: 25 mg/dL — ABNORMAL HIGH (ref 6–23)
CO2: 23 mEq/L (ref 19–32)
Calcium: 7.8 mg/dL — ABNORMAL LOW (ref 8.4–10.5)
Chloride: 114 mEq/L — ABNORMAL HIGH (ref 96–112)
Creatinine, Ser: 1.01 mg/dL (ref 0.50–1.10)
GFR calc Af Amer: 63 mL/min — ABNORMAL LOW (ref >90)
GFR, EST NON AFRICAN AMERICAN: 54 mL/min — AB (ref >90)
Glucose, Bld: 164 mg/dL — ABNORMAL HIGH (ref 70–99)
POTASSIUM: 3.5 meq/L — AB (ref 3.7–5.3)
Sodium: 148 mEq/L — ABNORMAL HIGH (ref 137–147)

## 2013-08-08 LAB — SODIUM
Sodium: 146 mEq/L (ref 137–147)
Sodium: 144 mEq/L (ref 137–147)

## 2013-08-08 LAB — PROCALCITONIN: Procalcitonin: <0.1 ng/mL

## 2013-08-08 MED ORDER — DEXTROSE 5 % IV SOLN
INTRAVENOUS | Status: DC
  Administered 2013-08-08: 01:00:00 via INTRAVENOUS

## 2013-08-08 MED ORDER — POTASSIUM CHLORIDE 10 MEQ/100ML IV SOLN
10.0000 meq | INTRAVENOUS | Status: AC
  Administered 2013-08-08 (×4): 10 meq via INTRAVENOUS
  Filled 2013-08-08: qty 100

## 2013-08-08 NOTE — Progress Notes (Signed)
Hypoglycemic Event  CBG: 45  At time= 2330  Treatment: D50 IV 25 mL  Symptoms: None  Follow-up CBG: Time:0002 Result: 123  Possible Reasons for Event: 15 units Lntus given on 08-06-13 when pt. Transitioned from insulin drip, lantus order dc'd by previous MD.  Comments/MD notified: Night floor coverage for [facility]. MD paged.    Lavelle Berland A Adalie Mand  Remember to initiate Hypoglycemia Order Set & complete

## 2013-08-08 NOTE — Clinical Documentation Improvement (Signed)
Conflicting documentation regarding NSTEMI.   ED Physician and Cardiology appear to state elevated troponin's due to renal failure  Medicine documenting NSTEMI  EKG without ischemic changes  "Had no real concerning cardiac chest pain" documented in Cardiac Consult  Please render an opinion to clarify if you feel NSTEMI: Ruled In         [place], [name] ,RN Clinical Documentation Specialist:  [facility]

## 2013-08-08 NOTE — Progress Notes (Signed)
[facility] Progress Note   LAVINE HARGROVE UYQ:[id number] DOB: April 07, 1940 DOA: 08/06/2013 PCP: Unice Cobble, MD  Brief narrative: Kristy RISSLER is a 73 y.o. female presenting on 08/06/2013 from home with lethargy for 3 days. She had stopped eating as well. She was found to have severe dehydration, hyperglycemia, ARF with metabolic acidosis and hypernatremia.   Subjective: More alert today.  Assessment/Plan: Principal Problem:   Hyperosmolar (nonketotic) coma/ encephalopathy/  Hypernatremia - cont to hydrate - improving daily  Active Problems:   ARF (acute renal failure) - cont to hydrate     Type II or unspecified type diabetes mellitus - sugars well controlled now  - cont sliding scale insulin with each meal    Alzheimer's disease - ends stage    Essential hypertension, benign - BP controlled    Long term current use of anticoagulant - pharmacy to manage coumadin    Paroxysmal atrial fibrillation - currently NSR    Sepsis/  UTI  -today is day three of Rocephin - no urine culture sent - will repeat UA today  Seizure - due to metabolic abnormalities  - CT head unremarkable   Code Status: Full code Family Communication:  With daughter on 5/6 Disposition Plan: likely home when stable  Consultants: none  Procedures: none  Antibiotics: Antibiotics Given (last 72 hours)   Date/Time Action Medication Dose Rate   08/06/13 0751 Given  [not given in the ER]   piperacillin-tazobactam (ZOSYN) IVPB 2.25 g 2.25 g 100 mL/hr   08/06/13 0900 Given   vancomycin (VANCOCIN) IVPB 1000 mg/200 mL premix 1,000 mg 200 mL/hr   08/07/13 0522 Given   cefTRIAXone (ROCEPHIN) 1 g in dextrose 5 % 50 mL IVPB 1 g 100 mL/hr   08/08/13 0524 Given   cefTRIAXone (ROCEPHIN) 1 g in dextrose 5 % 50 mL IVPB 1 g 100 mL/hr       DVT prophylaxis: Coumadin  Objective: Filed Weights   08/06/13 0505 08/06/13 0800  Weight: 63.504 kg (140 lb) 66.4 kg (146 lb 6.2 oz)   Blood pressure  139/63, pulse 73, temperature 98 F (36.7 C), temperature source Oral, resp. rate 14, height 5\' 6"  (1.676 m), weight 66.4 kg (146 lb 6.2 oz), SpO2 97.00%.  Intake/Output Summary (Last 24 hours) at 08/08/13 1312 Last data filed at 08/08/13 0800  Gross per 24 hour  Intake   3335 ml  Output    235 ml  Net   3100 ml     Exam: General: sleepy but communicative- No acute respiratory distress Lungs: Clear to auscultation bilaterally without wheezes or crackles Cardiovascular: Regular rate and rhythm without murmur gallop or rub normal S1 and S2 Abdomen: Nontender, nondistended, soft, bowel sounds positive, no rebound, no ascites, no appreciable mass Extremities: No significant cyanosis, clubbing, or edema bilateral lower extremities  Data Reviewed: Basic Metabolic Panel:  Recent Labs Lab 08/06/13 2012 08/07/13 0046 08/07/13 0510 08/07/13 0945 08/07/13 1508 08/07/13 1938 08/08/13 0141 08/08/13 0420  NA 166* 166* 165* 159* 154* 154* 146 148*  K 3.4* 3.3* 3.5*  --  4.3  --   --  3.5*  CL 130* 129* 128*  --  119*  --   --  114*  CO2 22 22 22   --  21  --   --  23  GLUCOSE 130* 125* 112*  --  309*  --   --  164*  BUN 71* 63* 58*  --  45*  --   --  25*  CREATININE 2.19* 2.00* 1.75*  --  1.43*  --   --  1.01  CALCIUM 8.6 8.6 8.6  --  8.2*  --   --  7.8*  MG  --  2.3  --   --   --   --   --   --   PHOS  --  3.2  --   --   --   --   --   --    Liver Function Tests:  Recent Labs Lab 08/06/13 0351 08/07/13 0046  AST 41* 22  ALT 21 15  ALKPHOS 90 71  BILITOT 0.4 0.5  PROT 9.0* 6.6  ALBUMIN 3.8 2.8*   No results found for this basename: LIPASE, AMYLASE,  in the last 168 hours No results found for this basename: AMMONIA,  in the last 168 hours CBC:  Recent Labs Lab 08/06/13 0351 08/07/13 0046  WBC 11.5* 9.8  NEUTROABS 8.0*  --   HGB 12.9 10.3*  HCT 43.8 34.3*  MCV 94.0 91.0  PLT 166 122*   Cardiac Enzymes:  Recent Labs Lab 08/06/13 0640 08/06/13 1500  TROPONINI  <0.30 <0.30   BNP (last 3 results) No results found for this basename: PROBNP,  in the last 8760 hours CBG:  Recent Labs Lab 08/07/13 2330 08/08/13 0002 08/08/13 0403 08/08/13 0803 08/08/13 1159  GLUCAP 45* 123* 144* 147* 169*    Recent Results (from the past 240 hour(s))  CULTURE, BLOOD (ROUTINE X 2)     Status: None   Collection Time    08/06/13  6:30 AM      Result Value Ref Range Status   Specimen Description BLOOD ARM RIGHT   Final   Special Requests     Final   Value: BOTTLES DRAWN AEROBIC AND ANAEROBIC BLUE 10CC RED 5CC   Culture  Setup Time     Final   Value: 08/06/2013 11:39     Performed at [facility]   Culture     Final   Value:        BLOOD CULTURE RECEIVED NO GROWTH TO DATE CULTURE WILL BE HELD FOR 5 DAYS BEFORE ISSUING A FINAL NEGATIVE REPORT     Performed at [facility]   Report Status PENDING   Incomplete  CULTURE, BLOOD (ROUTINE X 2)     Status: None   Collection Time    08/06/13  6:40 AM      Result Value Ref Range Status   Specimen Description BLOOD HAND RIGHT   Final   Special Requests BOTTLES DRAWN AEROBIC ONLY 2CC   Final   Culture  Setup Time     Final   Value: 08/06/2013 11:39     Performed at [facility]   Culture     Final   Value:        BLOOD CULTURE RECEIVED NO GROWTH TO DATE CULTURE WILL BE HELD FOR 5 DAYS BEFORE ISSUING A FINAL NEGATIVE REPORT     Performed at [facility]   Report Status PENDING   Incomplete  MRSA PCR SCREENING     Status: None   Collection Time    08/06/13  7:31 AM      Result Value Ref Range Status   MRSA by PCR NEGATIVE  NEGATIVE Final   Comment:            The GeneXpert MRSA Assay (FDA     approved for NASAL specimens     only), is  one component of a     comprehensive MRSA colonization     surveillance program. It is not     intended to diagnose MRSA     infection nor to guide or     monitor treatment for     MRSA infections.     Studies:  Recent x-ray studies have  been reviewed in detail by the Attending Physician  Scheduled Meds:  Scheduled Meds: . antiseptic oral rinse  15 mL Mouth Rinse q12n4p  . cefTRIAXone (ROCEPHIN)  IV  1 g Intravenous Q24H  . chlorhexidine  15 mL Mouth Rinse BID  . insulin aspart  0-15 Units Subcutaneous 6 times per day  . sodium chloride  3 mL Intravenous Q12H   Continuous Infusions: . sodium chloride 100 mL/hr at 08/08/13 0304  . dextrose 30 mL/hr at 08/08/13 3428    Time spent on care of this patient: 25 min   Debbe Odea, MD 08/08/2013, 1:12 PM  LOS: 2 days   [facility] Office  [phone] Pager - Text Page per Shea Evans   If 7PM-7AM, please contact night-coverage [email or web address]

## 2013-08-08 NOTE — Progress Notes (Addendum)
Pt transferred to the unit at 1500. Pt is nonverbal, does not respond to commands. Skin is intact except slight abrasions on R buttock. And bruising under left arm. Full assessment charted in [facility]. Call bell within reach. Visitor guidelines reviewed w/ pt and/or family.

## 2013-08-09 DIAGNOSIS — E870 Hyperosmolality and hypernatremia: Secondary | ICD-10-CM

## 2013-08-09 DIAGNOSIS — R4182 Altered mental status, unspecified: Secondary | ICD-10-CM

## 2013-08-09 DIAGNOSIS — N390 Urinary tract infection, site not specified: Secondary | ICD-10-CM

## 2013-08-09 DIAGNOSIS — E872 Acidosis, unspecified: Secondary | ICD-10-CM

## 2013-08-09 DIAGNOSIS — Z515 Encounter for palliative care: Secondary | ICD-10-CM

## 2013-08-09 DIAGNOSIS — D649 Anemia, unspecified: Secondary | ICD-10-CM

## 2013-08-09 DIAGNOSIS — E1101 Type 2 diabetes mellitus with hyperosmolarity with coma: Secondary | ICD-10-CM

## 2013-08-09 DIAGNOSIS — E1110 Type 2 diabetes mellitus with ketoacidosis without coma: Secondary | ICD-10-CM

## 2013-08-09 DIAGNOSIS — R569 Unspecified convulsions: Secondary | ICD-10-CM

## 2013-08-09 DIAGNOSIS — G309 Alzheimer's disease, unspecified: Secondary | ICD-10-CM

## 2013-08-09 DIAGNOSIS — G9340 Encephalopathy, unspecified: Secondary | ICD-10-CM

## 2013-08-09 DIAGNOSIS — N179 Acute kidney failure, unspecified: Secondary | ICD-10-CM

## 2013-08-09 DIAGNOSIS — F0280 Dementia in other diseases classified elsewhere without behavioral disturbance: Secondary | ICD-10-CM

## 2013-08-09 LAB — BASIC METABOLIC PANEL
BUN: 13 mg/dL (ref 6–23)
CHLORIDE: 106 meq/L (ref 96–112)
CO2: 23 meq/L (ref 19–32)
Calcium: 7.8 mg/dL — ABNORMAL LOW (ref 8.4–10.5)
Creatinine, Ser: 0.86 mg/dL (ref 0.50–1.10)
GFR calc Af Amer: 76 mL/min — ABNORMAL LOW (ref >90)
GFR calc non Af Amer: 66 mL/min — ABNORMAL LOW (ref >90)
Glucose, Bld: 160 mg/dL — ABNORMAL HIGH (ref 70–99)
POTASSIUM: 3.7 meq/L (ref 3.7–5.3)
Sodium: 142 mEq/L (ref 137–147)

## 2013-08-09 LAB — GLUCOSE, CAPILLARY
GLUCOSE-CAPILLARY: 150 mg/dL — AB (ref 70–99)
Glucose-Capillary: 115 mg/dL — ABNORMAL HIGH (ref 70–99)
Glucose-Capillary: 157 mg/dL — ABNORMAL HIGH (ref 70–99)

## 2013-08-09 LAB — PROTIME-INR
INR: 3.54 — AB (ref 0.00–1.49)
Prothrombin Time: 34.1 seconds — ABNORMAL HIGH (ref 11.6–15.2)

## 2013-08-09 NOTE — Progress Notes (Signed)
I spoke with Ms. Lindvall daughter, Lorna Few, whom I have spoken with many times at bedside about getting a time when her family can all get together to meet on Monday to make some more definitive decisions on Ms. Foresta's care (if she is still here in the hospital). If she is discharged before this is able to happen I would have palliative to follow wherever she may discharge to discuss specifically code status and feeding tube (discussed this with Cecilia as well).   Vinie Sill, NP Palliative Medicine Team Pager # [phone] (M-F 8a-5p) Team Phone # [phone] (Nights/Weekends)

## 2013-08-09 NOTE — Progress Notes (Signed)
Pt prepared for d/c to SNF. IV d/c'd. Skin intact except as most recently charted. Vitals are stable. Report called to receiving facility. Pt to be transported by ambulance service. 

## 2013-08-09 NOTE — Discharge Summary (Signed)
Physician Discharge Summary  [name] IRW:[id number] DOB: 04/03/1941 DOA: 08/06/2013  PCP: Unice Cobble, MD  Admit date: 08/06/2013 Discharge date: 08/09/2013  Time spent: >45 minutes  Recommendations for Outpatient Follow-up:  1. Check bmet in 1 wk 2. Resume Levemir if patient's PO intake has improved  Discharge Diagnoses:  Principal Problem:   Hyperosmolar (nonketotic) coma Active Problems:   Acute encephalopathy   Type II or unspecified type diabetes mellitus with neurological manifestations, uncontrolled   Alzheimer's disease   Essential hypertension, benign   Long term current use of anticoagulant   Paroxysmal atrial fibrillation   Hypernatremia   ARF (acute renal failure)   Sepsis   UTI (lower urinary tract infection)   Palliative care encounter   Protein-calorie malnutrition, severe   Discharge Condition: stable  Diet recommendation: regular diet  Filed Weights   08/06/13 0505 08/06/13 0800  Weight: 63.504 kg (140 lb) 66.4 kg (146 lb 6.2 oz)    History of present illness:  [name] is a 73 y.o. female presenting on 08/06/2013 from home with lethargy for 3 days and a tonic clonic seizure.  Patient has severe end-sage dementia at baseline, she is non-verbal does not recognize her family and is able to ambulate somewhat. Her family noted she was moving less for the past 3 days. Her husband stated that he was giving her less water so that she would stop wetting the bed. Patient was brought to ER and had a Grand-mal seizure in waiting room. She was brought in postictal and was found to have Na of 159, BG 900, homothermic. With INR of 5.85. Patietn is on chronic coumadin for hx of A.fib. ABG 7.205/45.8/82 She was found to have severe dehydration, hyperglycemia, ARF with metabolic acidosis and hypernatremia.   Hospital Course:  Principal Problem:  Hyperosmolar (nonketotic) coma/ encephalopathy/ Hypernatremia  - resolved with aggressive IF hydration  Active  Problems:  Seizure  - due to metabolic abnormalities  - CT head unremarkable   ARF (acute renal failure)  -resolved with IV hydration  Type II or unspecified type diabetes mellitus  - sugars well controlled now - PO intake poor therefore not resuming home dose of Levemir (was on 27 U) - can resume low dose Levemir at home if sugars are noted to be rising  Alzheimer's disease  - ends stage   Essential hypertension, benign  - BP controlled   Long term current use of anticoagulant  - INR elevated therefore cont to hold Coumadin - recheck INR in 3-4 days and decide about further management based on results  Paroxysmal atrial fibrillation  - currently NSR   Sepsis/ UTI  -received three days of Rocephin - no urine culture sent   Procedures:  none  Consultations:  none  Discharge Exam: Filed Vitals:   08/09/13 0502  BP: 135/75  Pulse: 73  Temp: 98.6 F (37 C)  Resp: 18    General: Awake, dsoriented Cardiovascular: RRR, no murmurs Respiratory: CTA b/l   Discharge Instructions You were cared for by a hospitalist during your hospital stay. If you have any questions about your discharge medications or the care you received while you were in the hospital after you are discharged, you can call the unit and asked to speak with the hospitalist on call if the hospitalist that took care of you is not available. Once you are discharged, your primary care physician will handle any further medical issues. Please note that NO REFILLS for any discharge medications will be  authorized once you are discharged, as it is imperative that you return to your primary care physician (or establish a relationship with a primary care physician if you do not have one) for your aftercare needs so that they can reassess your need for medications and monitor your lab values.      Discharge Orders   Future Orders Complete By Expires   Diet - low sodium heart healthy  As directed    Discharge  instructions  As directed    Face-to-face encounter (required for Medicare/Medicaid patients)  As directed    Questions:     The encounter with the patient was in whole, or in part, for the following medical condition, which is the primary reason for home health care:  tonic clonic seizure, hypernatremia and dehydration   I certify that, based on my findings, the following services are medically necessary home health services:  Physical therapy   My clinical findings support the need for the above services:  Cognitive impairments, dementia, or mental confusion  that make it unsafe to leave home   Further, I certify that my clinical findings support that this patient is homebound due to:  Ambulates short distances less than 300 feet   Reason for Medically Necessary Home Health Services:  Therapy- Therapeutic Exercises to Increase Strength and Endurance   Home Health  As directed    Questions:     To provide the following care/treatments:  PT   Increase activity slowly  As directed        Medication List    STOP taking these medications       Insulin Detemir 100 UNIT/ML Pen  Commonly known as:  LEVEMIR     warfarin 5 MG tablet  Commonly known as:  COUMADIN      TAKE these medications       memantine 10 MG tablet  Commonly known as:  NAMENDA  Take 10 mg by mouth 2 (two) times daily.     potassium chloride SA 20 MEQ tablet  Commonly known as:  K-DUR,KLOR-CON  Take 20 mEq by mouth 2 (two) times daily.     pravastatin 40 MG tablet  Commonly known as:  PRAVACHOL  Take 40 mg by mouth daily.       Allergies  Allergen Reactions  . Ace Inhibitors     REACTION: cough      The results of significant diagnostics from this hospitalization (including imaging, microbiology, ancillary and laboratory) are listed below for reference.    Significant Diagnostic Studies: Ct Head Wo Contrast  08/06/2013   CLINICAL DATA:  Seizures.  EXAM: CT HEAD WITHOUT CONTRAST  TECHNIQUE: Contiguous  axial images were obtained from the base of the skull through the vertex without intravenous contrast.  COMPARISON:  04/20/2013  FINDINGS: Skull and Sinuses:Left frontal scalp contusion. No underlying fracture. Persist opacification of a right frontal air cell, without air cell expansion.  Orbits: No acute abnormality.  Brain: No evidence of acute abnormality, such as acute infarction, hemorrhage, hydrocephalus, or mass lesion/mass effect.  Advanced brain atrophy with ex vacuo ventricular enlargement. Chronic small vessel disease with ischemic gliosis confluent around the lateral ventricles. Lacunea noted in the anterior limb right internal capsule and at the genu of the left internal capsule. 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  IMPRESSION: 1. No acute intracranial findings. 2. Left frontal scalp contusion without calvarial fracture. 3. Brain atrophy and chronic small vessel disease.   Electronically Signed   By:  Jorje Guild M.D.   On: 08/06/2013 05:08   Portable Chest 1 View  08/06/2013   CLINICAL DATA:  Cough, shortness of breath and chest pain.  EXAM: PORTABLE CHEST - 1 VIEW  COMPARISON:  CT ANGIO CHEST W/CM &/OR WO/CM dated 04/21/2013; DG CHEST 2 VIEW dated 04/20/2013  FINDINGS: The cardiac silhouette appears mildly enlarged, prominent calcified aortic knob is similar. Mild central bronchitic changes. Left dual lead cardiac pacemaker in situ. No pleural effusions or focal consolidations. No pneumothorax. Soft tissue planes and included osseous structures are nonsuspicious.  IMPRESSION: Stable mild cardiomegaly, stable central bronchitic changes without acute pulmonary process.   Electronically Signed   By: Elon Alas   On: 08/06/2013 06:47    Microbiology: Recent Results (from the past 240 hour(s))  CULTURE, BLOOD (ROUTINE X 2)     Status: None   Collection Time    08/06/13  6:30 AM      Result Value Ref Range Status   Specimen Description BLOOD ARM RIGHT   Final    Special Requests     Final   Value: BOTTLES DRAWN AEROBIC AND ANAEROBIC BLUE 10CC RED 5CC   Culture  Setup Time     Final   Value: 08/06/2013 11:39     Performed at [facility]   Culture     Final   Value:        BLOOD CULTURE RECEIVED NO GROWTH TO DATE CULTURE WILL BE HELD FOR 5 DAYS BEFORE ISSUING A FINAL NEGATIVE REPORT     Performed at [facility]   Report Status PENDING   Incomplete  CULTURE, BLOOD (ROUTINE X 2)     Status: None   Collection Time    08/06/13  6:40 AM      Result Value Ref Range Status   Specimen Description BLOOD HAND RIGHT   Final   Special Requests BOTTLES DRAWN AEROBIC ONLY 2CC   Final   Culture  Setup Time     Final   Value: 08/06/2013 11:39     Performed at [facility]   Culture     Final   Value:        BLOOD CULTURE RECEIVED NO GROWTH TO DATE CULTURE WILL BE HELD FOR 5 DAYS BEFORE ISSUING A FINAL NEGATIVE REPORT     Performed at [facility]   Report Status PENDING   Incomplete  MRSA PCR SCREENING     Status: None   Collection Time    08/06/13  7:31 AM      Result Value Ref Range Status   MRSA by PCR NEGATIVE  NEGATIVE Final   Comment:            The GeneXpert MRSA Assay (FDA     approved for NASAL specimens     only), is one component of a     comprehensive MRSA colonization     surveillance program. It is not     intended to diagnose MRSA     infection nor to guide or     monitor treatment for     MRSA infections.     Labs: Basic Metabolic Panel:  Recent Labs Lab 08/06/13 2012 08/07/13 1245 08/07/13 0510  08/07/13 1508 08/07/13 1938 08/08/13 0141 08/08/13 0420 08/08/13 1215 08/09/13 0746  NA 166* 166* 165*  < > 154* 154* 146 148* 144 142  K 3.4* 3.3* 3.5*  --  4.3  --   --  3.5*  --  3.7  CL 130* 129* 128*  --  119*  --   --  114*  --  106  CO2 22 22 22   --  21  --   --  23  --  23  GLUCOSE 130* 125* 112*  --  309*  --   --  164*  --  160*  BUN 71* 63* 58*  --  45*  --   --  25*  --  13   CREATININE 2.19* 2.00* 1.75*  --  1.43*  --   --  1.01  --  0.86  CALCIUM 8.6 8.6 8.6  --  8.2*  --   --  7.8*  --  7.8*  MG  --  2.3  --   --   --   --   --   --   --   --   PHOS  --  3.2  --   --   --   --   --   --   --   --   < > = values in this interval not displayed. Liver Function Tests:  Recent Labs Lab 08/06/13 0351 08/07/13 0046  AST 41* 22  ALT 21 15  ALKPHOS 90 71  BILITOT 0.4 0.5  PROT 9.0* 6.6  ALBUMIN 3.8 2.8*   No results found for this basename: LIPASE, AMYLASE,  in the last 168 hours No results found for this basename: AMMONIA,  in the last 168 hours CBC:  Recent Labs Lab 08/06/13 0351 08/07/13 0046  WBC 11.5* 9.8  NEUTROABS 8.0*  --   HGB 12.9 10.3*  HCT 43.8 34.3*  MCV 94.0 91.0  PLT 166 122*   Cardiac Enzymes:  Recent Labs Lab 08/06/13 0640 08/06/13 1500  TROPONINI <0.30 <0.30   BNP: BNP (last 3 results) No results found for this basename: PROBNP,  in the last 8760 hours CBG:  Recent Labs Lab 08/08/13 1653 08/08/13 2021 08/08/13 2320 08/09/13 0316 08/09/13 0742  GLUCAP 232* 162* 229* 115* 150*       Signed:  Debbe Odea, MD  [facility] 08/09/2013, 9:52 AM

## 2013-08-09 NOTE — Clinical Social Work Psychosocial (Signed)
Clinical Social Work Department BRIEF PSYCHOSOCIAL ASSESSMENT 08/09/2013  Patient:  [name], [name]     Account Number:  [id number]     Admit date:  08/06/2013  Clinical Social Worker:  Lovey Newcomer  Date/Time:  08/09/2013 03:41 PM  Referred by:  Physician  Date Referred:  08/09/2013 Referred for  SNF Placement   Other Referral:   Interview type:  Family Other interview type:   Patient's family (children and husband) interviewed at bedside.    PSYCHOSOCIAL DATA Living Status:  FAMILY Admitted from facility:   Level of care:   Primary support name:  Cecilia Primary support relationship to patient:  CHILD, ADULT Degree of support available:   Support is strong.    CURRENT CONCERNS Current Concerns  Post-Acute Placement   Other Concerns:    SOCIAL WORK ASSESSMENT / PLAN CSW met with family at bedside to complete assessment. Patient's family is in agreement with recommendation for SNF for patient and they state that they would like patient to be placed at [facility]. CSW explained SNF search/placement process and answered family's questions. Family is requesting ambulance transport for patient to facility. Family is optimistic that patient will improve with SNF stay.   Assessment/plan status:  Psychosocial Support/Ongoing Assessment of Needs Other assessment/ plan:   Complete Fl2, Fax, PASRR   Information/referral to community resources:   CSW contact information and SNF list given to patient's family.    PATIENT'S/FAMILY'S RESPONSE TO PLAN OF CARE: Patient's family agreeable to SNF placement for patient. Patient's family want [facility] for patient. Family was pleasant, appropriate, and appreciaitive of CSW assistance with DC needs.       Liz Beach MSW, [place], [place], [phone]

## 2013-08-09 NOTE — Evaluation (Signed)
Physical Therapy Evaluation Patient Details Name: [name] MRN: [id number] DOB: [birth year] Today's Date: 08/09/2013   History of Present Illness  Pt admitted from home with hyperosmolar coma, UTI, lethargy. Pt with dementia   Clinical Impression  Pt with minimal verbalizations and following commands grossly 25% of the time. Pt performs better with multimodal cues and requires decreased assist with mobility when automatic from pt and not cued for movement. Pt with significant posterior bias in sitting and standing, weakness, impaired cognition and mobility and will require 1-2 person max assist for all mobility at present pt will benefit from acute therapy to maximize function and transfers to decrease burden of care and increase quality of life. Recommend daily mobility with nursing staff.     Follow Up Recommendations SNF;Supervision/Assistance - 24 hour    Equipment Recommendations  Rolling walker with 5" wheels;Wheelchair (measurements PT)    Recommendations for Other Services       Precautions / Restrictions Precautions Precautions: Fall      Mobility  Bed Mobility Overal bed mobility: Needs Assistance Bed Mobility: Supine to Sit     Supine to sit: Max assist     General bed mobility comments: max multimodal cueing with assist to pivot legs to eOB and elevate trunk from surface  Transfers Overall transfer level: Needs assistance   Transfers: Sit to/from Stand;Stand Pivot Transfers Sit to Stand: Max assist Stand pivot transfers: +2 safety/equipment;Mod assist       General transfer comment: Pt on initial standing with posterior lean, second trial remained in trunk flexion despite cueing and assist for posture and position. Pt with max cueing began transfer to chair on her own with only mod assist to pivot for safety and increased time in standing. Pt incontinent of stool noted and stood 3 time from chair with max assist and facilitation at belt for anterior  translation as pt maintained extended trunk with significant posterior bias  Ambulation/Gait Ambulation/Gait assistance:  (unable despite attempts with and without RW and max multimodal cues)              Stairs            Wheelchair Mobility    Modified Rankin (Stroke Patients Only)       Balance Overall balance assessment: Needs assistance   Sitting balance-Leahy Scale: Poor Sitting balance - Comments: Pt initially in sitting with significant posterior lean and after 1 standing trial pt able to shift weight to midline and maintain sitting with supervision grossly 8 min Postural control: Posterior lean   Standing balance-Leahy Scale: Zero                               Pertinent Vitals/Pain PaINAD= 0    Home Living Family/patient expects to be discharged to:: Private residence Living Arrangements: Spouse/significant other;Children Available Help at Discharge: Family;Available 24 hours/day Type of Home: House         Home Equipment: None      Prior Function Level of Independence: Needs assistance   Gait / Transfers Assistance Needed: pt was walking and moving on her own with supervision  ADL's / Homemaking Assistance Needed: niece has been assisting with ADLs for grossly a week prior to admission        Hand Dominance        Extremity/Trunk Assessment   Upper Extremity Assessment: Generalized weakness           Lower Extremity  Assessment: Generalized weakness      Cervical / Trunk Assessment: Kyphotic  Communication      Cognition Arousal/Alertness: Awake/alert Behavior During Therapy: Flat affect Overall Cognitive Status: No family/caregiver present to determine baseline cognitive functioning                      General Comments      Exercises        Assessment/Plan    PT Assessment Patient needs continued PT services  PT Diagnosis Difficulty walking;Altered mental status;Generalized weakness   PT  Problem List Decreased strength;Decreased cognition;Decreased activity tolerance;Decreased balance;Decreased mobility;Decreased safety awareness;Decreased knowledge of use of DME  PT Treatment Interventions Gait training;Functional mobility training;Therapeutic activities;Therapeutic exercise;Patient/family education;Cognitive remediation;Neuromuscular re-education;Balance training;DME instruction   PT Goals (Current goals can be found in the Care Plan section) Acute Rehab PT Goals Patient Stated Goal: family would like pt to be able to walk  PT Goal Formulation: With family Time For Goal Achievement: 08/23/13 Potential to Achieve Goals: Fair    Frequency Min 2X/week   Barriers to discharge Decreased caregiver support      Co-evaluation               End of Session Equipment Utilized During Treatment: Gait belt Activity Tolerance: Patient tolerated treatment well Patient left: in chair;with call bell/phone within [name];with chair alarm set Nurse Communication: Mobility status         Time: 4854-6270 PT Time Calculation (min): 28 min   Charges:   PT Evaluation $Initial PT Evaluation Tier I: 1 Procedure PT Treatments $Therapeutic Activity: 23-37 mins   PT G Codes:          [name] [name] 08/09/2013, 12:02 PM [name], [name]

## 2013-08-09 NOTE — Clinical Social Work Placement (Signed)
Clinical Social Work Department CLINICAL SOCIAL WORK PLACEMENT NOTE 08/09/2013  Patient:  [name], [name]  Account Number:  [id number] Admit date:  08/06/2013  Clinical Social Worker:  Kemper Durie, [place]  Date/time:  08/09/2013 03:49 PM  Clinical Social Work is seeking post-discharge placement for this patient at the following level of care:   SKILLED NURSING   (*CSW will update this form in Epic as items are completed)   08/09/2013  Patient/family provided with [facility] Department of Clinical Social Work's list of facilities offering this level of care within the geographic area requested by the patient (or if unable, by the patient's family).  08/09/2013  Patient/family informed of their freedom to choose among providers that offer the needed level of care, that participate in Medicare, Medicaid or managed care program needed by the patient, have an available bed and are willing to accept the patient.  08/09/2013  Patient/family informed of [facility]' ownership interest in [facility], as well as of the fact that they are under no obligation to receive care at this facility.  PASARR submitted to [facility]DS on 08/09/2013 PASARR number received from [employer] on 08/09/2013  FL2 transmitted to all facilities in geographic area requested by pt/family on  08/09/2013 FL2 transmitted to all facilities within larger geographic area on   Patient informed that his/her managed care company has contracts with or will negotiate with  certain facilities, including the following:     Patient/family informed of bed offers received:  08/09/2013 Patient chooses bed at [facility] Physician recommends and patient chooses bed at    Patient to be transferred to [facility] on  08/09/2013 Patient to be transferred to facility by Ambulance  The following physician request were entered in Epic:   Additional Comments: Per Md (verbal order given to [place] and RN  over phone) patient ready to Dc to [facility]. RN, patient, family, and facility notified of DC. RN given number for report. DC packet on chart. Ambulance transport requested for patient.    Liz Beach MSW, [place], [place], [phone]

## 2013-08-12 LAB — CULTURE, BLOOD (ROUTINE X 2)
CULTURE: NO GROWTH
Culture: NO GROWTH

## 2013-08-29 ENCOUNTER — Emergency Department (HOSPITAL_COMMUNITY): Payer: Medicare Other

## 2013-08-29 ENCOUNTER — Emergency Department (HOSPITAL_COMMUNITY)
Admission: EM | Admit: 2013-08-29 | Discharge: 2013-08-29 | Disposition: A | Discharge status: Skilled Nursing Facility | Payer: Medicare Other | Attending: Emergency Medicine | Admitting: Emergency Medicine

## 2013-08-29 DIAGNOSIS — R040 Epistaxis: Secondary | ICD-10-CM | POA: Insufficient documentation

## 2013-08-29 DIAGNOSIS — R296 Repeated falls: Secondary | ICD-10-CM | POA: Insufficient documentation

## 2013-08-29 DIAGNOSIS — Z888 Allergy status to other drugs, medicaments and biological substances status: Secondary | ICD-10-CM | POA: Insufficient documentation

## 2013-08-29 DIAGNOSIS — Y9210 Unspecified residential institution as the place of occurrence of the external cause: Secondary | ICD-10-CM | POA: Insufficient documentation

## 2013-08-29 DIAGNOSIS — R1310 Dysphagia, unspecified: Secondary | ICD-10-CM | POA: Insufficient documentation

## 2013-08-29 DIAGNOSIS — Z86718 Personal history of other venous thrombosis and embolism: Secondary | ICD-10-CM | POA: Insufficient documentation

## 2013-08-29 DIAGNOSIS — S022XXA Fracture of nasal bones, initial encounter for closed fracture: Secondary | ICD-10-CM | POA: Insufficient documentation

## 2013-08-29 DIAGNOSIS — Z794 Long term (current) use of insulin: Secondary | ICD-10-CM | POA: Insufficient documentation

## 2013-08-29 DIAGNOSIS — Y939 Activity, unspecified: Secondary | ICD-10-CM | POA: Insufficient documentation

## 2013-08-29 DIAGNOSIS — D649 Anemia, unspecified: Secondary | ICD-10-CM | POA: Insufficient documentation

## 2013-08-29 DIAGNOSIS — E119 Type 2 diabetes mellitus without complications: Secondary | ICD-10-CM | POA: Insufficient documentation

## 2013-08-29 DIAGNOSIS — Z7901 Long term (current) use of anticoagulants: Secondary | ICD-10-CM | POA: Insufficient documentation

## 2013-08-29 DIAGNOSIS — W19XXXA Unspecified fall, initial encounter: Secondary | ICD-10-CM

## 2013-08-29 DIAGNOSIS — F0280 Dementia in other diseases classified elsewhere without behavioral disturbance: Secondary | ICD-10-CM | POA: Insufficient documentation

## 2013-08-29 DIAGNOSIS — I10 Essential (primary) hypertension: Secondary | ICD-10-CM | POA: Insufficient documentation

## 2013-08-29 DIAGNOSIS — E876 Hypokalemia: Secondary | ICD-10-CM | POA: Insufficient documentation

## 2013-08-29 DIAGNOSIS — G309 Alzheimer's disease, unspecified: Secondary | ICD-10-CM | POA: Insufficient documentation

## 2013-08-29 DIAGNOSIS — Z950 Presence of cardiac pacemaker: Secondary | ICD-10-CM | POA: Insufficient documentation

## 2013-08-29 LAB — PROTIME-INR
INR: 1.62 — AB (ref 0.00–1.49)
PROTHROMBIN TIME: 18.8 s — AB (ref 11.6–15.2)

## 2013-08-29 LAB — COMPREHENSIVE METABOLIC PANEL
ALBUMIN: 3.3 g/dL — AB (ref 3.5–5.2)
ALT: 14 U/L (ref 0–35)
AST: 22 U/L (ref 0–37)
Alkaline Phosphatase: 79 U/L (ref 39–117)
BUN: 11 mg/dL (ref 6–23)
CALCIUM: 9.4 mg/dL (ref 8.4–10.5)
CO2: 24 mEq/L (ref 19–32)
CREATININE: 0.86 mg/dL (ref 0.50–1.10)
Chloride: 100 mEq/L (ref 96–112)
GFR calc Af Amer: 76 mL/min — ABNORMAL LOW (ref >90)
GFR calc non Af Amer: 66 mL/min — ABNORMAL LOW (ref >90)
Glucose, Bld: 204 mg/dL — ABNORMAL HIGH (ref 70–99)
POTASSIUM: 4.1 meq/L (ref 3.7–5.3)
Sodium: 135 mEq/L — ABNORMAL LOW (ref 137–147)
TOTAL PROTEIN: 7.4 g/dL (ref 6.0–8.3)
Total Bilirubin: 0.7 mg/dL (ref 0.3–1.2)

## 2013-08-29 LAB — URINALYSIS, ROUTINE W REFLEX MICROSCOPIC
Bilirubin Urine: NEGATIVE
Glucose, UA: NEGATIVE mg/dL
Hgb urine dipstick: NEGATIVE
Ketones, ur: NEGATIVE mg/dL
NITRITE: NEGATIVE
Protein, ur: NEGATIVE mg/dL
Specific Gravity, Urine: 1.011 (ref 1.005–1.030)
UROBILINOGEN UA: 0.2 mg/dL (ref 0.0–1.0)
pH: 5.5 (ref 5.0–8.0)

## 2013-08-29 LAB — URINE MICROSCOPIC-ADD ON

## 2013-08-29 LAB — CBC WITH DIFFERENTIAL/PLATELET
Basophils Absolute: 0 10*3/uL (ref 0.0–0.1)
Basophils Relative: 1 % (ref 0–1)
EOS ABS: 0.2 10*3/uL (ref 0.0–0.7)
Eosinophils Relative: 4 % (ref 0–5)
HCT: 33.9 % — ABNORMAL LOW (ref 36.0–46.0)
HEMOGLOBIN: 10.8 g/dL — AB (ref 12.0–15.0)
Lymphocytes Relative: 35 % (ref 12–46)
Lymphs Abs: 1.6 10*3/uL (ref 0.7–4.0)
MCH: 27.8 pg (ref 26.0–34.0)
MCHC: 31.9 g/dL (ref 30.0–36.0)
MCV: 87.4 fL (ref 78.0–100.0)
MONO ABS: 0.4 10*3/uL (ref 0.1–1.0)
MONOS PCT: 8 % (ref 3–12)
NEUTROS PCT: 52 % (ref 43–77)
Neutro Abs: 2.5 10*3/uL (ref 1.7–7.7)
Platelets: 193 10*3/uL (ref 150–400)
RBC: 3.88 MIL/uL (ref 3.87–5.11)
RDW: 15.3 % (ref 11.5–15.5)
WBC: 4.7 10*3/uL (ref 4.0–10.5)

## 2013-08-29 LAB — I-STAT TROPONIN, ED: TROPONIN I, POC: 0.01 ng/mL (ref 0.00–0.08)

## 2013-08-29 NOTE — Discharge Instructions (Signed)
You have sustained a nasal bone fracture.  Your nose bleed is controlled.  Follow-up with ENT, Dr. Redmond Baseman-- call and scheduled appt time. Monitor for recurrent nosebleeds. There was a small nodule on chest x-ray-- recommended repeat exam for close FU which may be done by primary care physician. INR mildly low-- continue close monitoring. Return to the ED for new concerns.

## 2013-08-29 NOTE — ED Provider Notes (Signed)
CSN: [id number]     Arrival date & time 08/29/13  0654 History   First MD Initiated Contact with Patient 08/29/13 [phone]     Chief Complaint  Patient presents with  . Fall     (Consider location/radiation/quality/duration/timing/severity/associated sxs/prior Treatment) The history is provided by the patient and medical records.   LEVEL 5 CAVEAT:  DEMENTIA This is a 73 year old female with past medical history significant for hypertension, diabetes, Alzheimer's disease, A. fib currently on Coumadin, presenting to the ED for an unwitnessed fall.  Patient is from [place] home and was found on the floor this morning with a nosebleed. Patient has advanced Alzheimer's disease with dementia, is nonverbal at baseline and does not follow commands. She is ambulatory.  No noted seizure activity, fever, or recent illness per facility.  VS stable on arrival.  Past Medical History  Diagnosis Date  . Dysphagia   . Diabetes mellitus   . Alzheimer disease   . Hypokalemia   . Anemia   . 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 DVT (deep venous thrombosis)     [facility]  . Hypertension    Past Surgical History  Procedure Laterality Date  . Syncope  05/2001    pacer  . Pacemaker placement     Family History  Problem Relation Age of Onset  . Alzheimer's disease    . Diabetes    . Stroke Father   . Diabetes Brother   . Seizures Brother   . Diabetes Brother    History  Substance Use Topics  . Smoking status: Never Smoker   . Smokeless tobacco: Not on file     Comment: Patient exposed to second hand smoke daily   . Alcohol Use: No   OB History   Grav Para Term Preterm Abortions TAB SAB Ect Mult Living                 Review of Systems  Unable to perform ROS: Dementia      Allergies  Ace inhibitors  Home Medications   Prior to Admission medications   Medication Sig Start Date End Date Taking? Authorizing Provider  memantine (NAMENDA) 10 MG tablet Take 10  mg by mouth 2 (two) times daily.    Historical Provider, MD  potassium chloride SA (K-DUR,KLOR-CON) 20 MEQ tablet Take 20 mEq by mouth 2 (two) times daily.    Historical Provider, MD  pravastatin (PRAVACHOL) 40 MG tablet Take 40 mg by mouth daily.    Historical Provider, MD   BP 190/75  Pulse 70  Temp(Src) 98.4 F (36.9 C) (Oral)  Resp 18  SpO2 98%  Physical Exam  Nursing note and vitals reviewed. Constitutional: She is oriented to person, place, and time. She appears well-developed and well-nourished.  HENT:  Head: Normocephalic and atraumatic.  Nose: Sinus tenderness present. No septal deviation or nasal septal hematoma. Epistaxis is observed.  Mouth/Throat: Oropharynx is clear and moist.  Bridge of nose with large amount of swelling, patient winces with palpation; dried blood present in both nostrils without active bleeding; no septal hematoma or septal deviation noted  Eyes: Conjunctivae and EOM are normal. Pupils are equal, round, and reactive to light.  Neck: Normal range of motion. Neck supple.  Cardiovascular: Normal rate, regular rhythm and normal heart sounds.   Pulmonary/Chest: Effort normal and breath sounds normal. No respiratory distress. She has no wheezes.  Bruising to left breast that appears old with green/yellow discoloration; no bony deformity or crepitus  noted  Abdominal: Soft. Bowel sounds are normal. There is no tenderness. There is no guarding.  Musculoskeletal: Normal range of motion.  c-collar in place Pelvis non-tender and appears stable; no significant leg shortening noted; distal pulses intact  Neurological: She is alert and oriented to person, place, and time.  Skin: Skin is warm and dry.  Psychiatric: She has a normal mood and affect.    ED Course  Procedures (including critical care time) Labs Review Labs Reviewed  CBC WITH DIFFERENTIAL - Abnormal; Notable for the following:    Hemoglobin 10.8 (*)    HCT 33.9 (*)    All other components within  normal limits  COMPREHENSIVE METABOLIC PANEL - Abnormal; Notable for the following:    Sodium 135 (*)    Glucose, Bld 204 (*)    Albumin 3.3 (*)    GFR calc non Af Amer 66 (*)    GFR calc Af Amer 76 (*)    All other components within normal limits  PROTIME-INR - Abnormal; Notable for the following:    Prothrombin Time 18.8 (*)    INR 1.62 (*)    All other components within normal limits  URINALYSIS, ROUTINE W REFLEX MICROSCOPIC - Abnormal; Notable for the following:    Leukocytes, UA TRACE (*)    All other components within normal limits  URINE MICROSCOPIC-ADD ON  Randolm Idol, ED    Imaging Review Dg Chest 1 View  08/29/2013   CLINICAL DATA:  Fall, trauma  EXAM: CHEST - 1 VIEW  COMPARISON:  08/06/2013  FINDINGS: Mild cardiomegaly without CHF or edema. No effusion or pneumothorax. Left subclavian 2 lead pacer noted. Right upper lobe 1.5 cm nodular density noted, new since the prior study. This could represent superimposed shadows versus a developing lung nodule. Recommend initial follow-up with a dedicated follow-up PA lateral chest exam. Atherosclerosis noted of the aorta.  IMPRESSION: Cardiomegaly without CHF  1.5 cm right upper lobe nodular density, indeterminate for superimposed shadows versus developing lung nodule. See above comment.   Electronically Signed   By: Daryll Brod M.D.   On: 08/29/2013 08:39   Dg Pelvis 1-2 Views  08/29/2013   CLINICAL DATA:  Recent traumatic injury with pain  EXAM: PELVIS - 1-2 VIEW  COMPARISON:  08/19/2010  FINDINGS: Pelvic ring is intact. No acute fracture or dislocation is noted. Mild degenerative changes of the hip joints are again seen. No soft tissue changes are noted.  IMPRESSION: No acute abnormality noted.   Electronically Signed   By: Inez Catalina M.D.   On: 08/29/2013 08:38   Ct Head Wo Contrast  08/29/2013   CLINICAL DATA:  Status post fall, patient on Coumadin, swelling around the nose  EXAM: CT HEAD WITHOUT CONTRAST  CT MAXILLOFACIAL  WITHOUT CONTRAST  CT CERVICAL SPINE WITHOUT CONTRAST  TECHNIQUE: Multidetector CT imaging of the head, cervical spine, and maxillofacial structures were performed using the standard protocol without intravenous contrast. Multiplanar CT image reconstructions of the cervical spine and maxillofacial structures were also generated.  COMPARISON:  08/06/2013, 04/20/2013, 08/19/2010  FINDINGS: CT HEAD FINDINGS  There is no evidence of mass effect, midline shift, or extra-axial fluid collections. There is no evidence of a space-occupying lesion or intracranial hemorrhage. There is no evidence of a cortical-based area of acute infarction. 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 There is an old left thalamic lacunar infarct. There is an old right cerebellar infarct. There is generalized cerebral atrophy. There is periventricular white matter low  attenuation likely secondary to microangiopathy.  The ventricles and sulci are appropriate for the patient's age. There is generalized ventriculomegaly which is commensurate with the degree of cortical atrophy. The basal cisterns are patent.  Visualized portions of the orbits are unremarkable. The visualized portions of the paranasal sinuses and mastoid air cells are unremarkable. Cerebrovascular atherosclerotic calcifications are noted.  The osseous structures are unremarkable.  CT MAXILLOFACIAL FINDINGS  The globes are intact. The orbital walls are intact. The orbital floors are intact. The maxilla is intact. The mandible is intact. The zygomatic arches are intact. There are comminuted bilateral nasal bone fractures with overlying soft tissue swelling. There is buckling of the nasal septum likely reflecting a nondisplaced fracture with leftward deviation. The temporomandibular joints are normal.  Small mucous retention cyst in the left maxillary sinus. No paranasal sinus air-fluid level. The visualized portions of the mastoid sinuses are well aerated.  CT CERVICAL SPINE  FINDINGS  The alignment is anatomic. The vertebral body heights are maintained. There is no acute fracture. There is no static listhesis. The prevertebral soft tissues are normal. The intraspinal soft tissues are not fully imaged on this examination due to poor soft tissue contrast, but there is no gross soft tissue abnormality.  There is mild degenerative disc disease at C5-6 with disc height loss. There is a broad-based disc bulge at C4-5. There is a broad-based disc osteophyte complex at C5-6 with bilateral uncovertebral degenerative changes resulting in foraminal encroachment.  The visualized portions of the lung apices demonstrate no focal abnormality.Bilateral carotid artery atherosclerosis.  IMPRESSION: 1. No acute intracranial pathology. 2. Comminuted bilateral nasal bone fractures with overlying soft tissue swelling. Buckling of the nasal septum likely reflecting a nondisplaced fracture with leftward deviation. 3. No acute osseous injury of the cervical spine. 4. 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   Electronically Signed   By: Kathreen Devoid   On: 08/29/2013 08:50   Ct Cervical Spine Wo Contrast  08/29/2013   CLINICAL DATA:  Status post fall, patient on Coumadin, swelling around the nose  EXAM: CT HEAD WITHOUT CONTRAST  CT MAXILLOFACIAL WITHOUT CONTRAST  CT CERVICAL SPINE WITHOUT CONTRAST  TECHNIQUE: Multidetector CT imaging of the head, cervical spine, and maxillofacial structures were performed using the standard protocol without intravenous contrast. Multiplanar CT image reconstructions of the cervical spine and maxillofacial structures were also generated.  COMPARISON:  08/06/2013, 04/20/2013, 08/19/2010  FINDINGS: CT HEAD FINDINGS  There is no evidence of mass effect, midline shift, or extra-axial fluid collections. There is no evidence of a space-occupying lesion or intracranial hemorrhage. There is no evidence of a cortical-based area of acute infarction. There is an old  right basal ganglia lacunar infarct. There is an old left thalamic lacunar infarct. There is an old right cerebellar infarct. There is generalized cerebral atrophy. There is periventricular white matter low attenuation likely secondary to microangiopathy.  The ventricles and sulci are appropriate for the patient's age. There is generalized ventriculomegaly which is commensurate with the degree of cortical atrophy. The basal cisterns are patent.  Visualized portions of the orbits are unremarkable. The visualized portions of the paranasal sinuses and mastoid air cells are unremarkable. Cerebrovascular atherosclerotic calcifications are noted.  The osseous structures are unremarkable.  CT MAXILLOFACIAL FINDINGS  The globes are intact. The orbital walls are intact. The orbital floors are intact. The maxilla is intact. The mandible is intact. The zygomatic arches are intact. There are comminuted bilateral nasal bone fractures with overlying  soft tissue swelling. There is buckling of the nasal septum likely reflecting a nondisplaced fracture with leftward deviation. The temporomandibular joints are normal.  Small mucous retention cyst in the left maxillary sinus. No paranasal sinus air-fluid level. The visualized portions of the mastoid sinuses are well aerated.  CT CERVICAL SPINE FINDINGS  The alignment is anatomic. The vertebral body heights are maintained. There is no acute fracture. There is no static listhesis. The prevertebral soft tissues are normal. The intraspinal soft tissues are not fully imaged on this examination due to poor soft tissue contrast, but there is no gross soft tissue abnormality.  There is mild degenerative disc disease at C5-6 with disc height loss. There is a broad-based disc bulge at C4-5. There is a broad-based disc osteophyte complex at C5-6 with bilateral uncovertebral degenerative changes resulting in foraminal encroachment.  The visualized portions of the lung apices demonstrate no focal  abnormality.Bilateral carotid artery atherosclerosis.  IMPRESSION: 1. No acute intracranial pathology. 2. Comminuted bilateral nasal bone fractures with overlying soft tissue swelling. Buckling of the nasal septum likely reflecting a nondisplaced fracture with leftward deviation. 3. No acute osseous injury of the cervical spine. 4. 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   Electronically Signed   By: Kathreen Devoid   On: 08/29/2013 08:50   Ct Maxillofacial Wo Cm  08/29/2013   CLINICAL DATA:  Status post fall, patient on Coumadin, swelling around the nose  EXAM: CT HEAD WITHOUT CONTRAST  CT MAXILLOFACIAL WITHOUT CONTRAST  CT CERVICAL SPINE WITHOUT CONTRAST  TECHNIQUE: Multidetector CT imaging of the head, cervical spine, and maxillofacial structures were performed using the standard protocol without intravenous contrast. Multiplanar CT image reconstructions of the cervical spine and maxillofacial structures were also generated.  COMPARISON:  08/06/2013, 04/20/2013, 08/19/2010  FINDINGS: CT HEAD FINDINGS  There is no evidence of mass effect, midline shift, or extra-axial fluid collections. There is no evidence of a space-occupying lesion or intracranial hemorrhage. There is no evidence of a cortical-based area of acute infarction. 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 There is an old left thalamic lacunar infarct. There is an old right cerebellar infarct. There is generalized cerebral atrophy. There is periventricular white matter low attenuation likely secondary to microangiopathy.  The ventricles and sulci are appropriate for the patient's age. There is generalized ventriculomegaly which is commensurate with the degree of cortical atrophy. The basal cisterns are patent.  Visualized portions of the orbits are unremarkable. The visualized portions of the paranasal sinuses and mastoid air cells are unremarkable. Cerebrovascular atherosclerotic calcifications are noted.  The  osseous structures are unremarkable.  CT MAXILLOFACIAL FINDINGS  The globes are intact. The orbital walls are intact. The orbital floors are intact. The maxilla is intact. The mandible is intact. The zygomatic arches are intact. There are comminuted bilateral nasal bone fractures with overlying soft tissue swelling. There is buckling of the nasal septum likely reflecting a nondisplaced fracture with leftward deviation. The temporomandibular joints are normal.  Small mucous retention cyst in the left maxillary sinus. No paranasal sinus air-fluid level. The visualized portions of the mastoid sinuses are well aerated.  CT CERVICAL SPINE FINDINGS  The alignment is anatomic. The vertebral body heights are maintained. There is no acute fracture. There is no static listhesis. The prevertebral soft tissues are normal. The intraspinal soft tissues are not fully imaged on this examination due to poor soft tissue contrast, but there is no gross soft tissue abnormality.  There is  mild degenerative disc disease at C5-6 with disc height loss. There is a broad-based disc bulge at C4-5. There is a broad-based disc osteophyte complex at C5-6 with bilateral uncovertebral degenerative changes resulting in foraminal encroachment.  The visualized portions of the lung apices demonstrate no focal abnormality.Bilateral carotid artery atherosclerosis.  IMPRESSION: 1. No acute intracranial pathology. 2. Comminuted bilateral nasal bone fractures with overlying soft tissue swelling. Buckling of the nasal septum likely reflecting a nondisplaced fracture with leftward deviation. 3. No acute osseous injury of the cervical spine. 4. 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   Electronically Signed   By: Kathreen Devoid   On: 08/29/2013 08:50     EKG Interpretation None      MDM   Final diagnoses:  Fall  Nasal bone fracture   73 year old demented female presenting to the ED for unwitnessed fall. She was found to  have a nosebleed. No loss of consciousness. Patient is on daily Coumadin.  On exam the bridge of her nose does appear swollen and patient winces with palpation. There is no active epistaxis.  Pelvis appears stable without any tenderness or significant leg shortening. Small amount of old bruising to the left breast, suspected from previous fall.  Will obtain labs, u/a, CT head/CS, CT max/face, pelvis, and CXR.  VS stable at this time.  Labs are reassuring-- INR subtherapeutic.  UA without signs of infection.  CT head and C-spine negative.  CT max face revealing comminuted bilateral nasal bone fractures, possible non-displaced septal fracture.  Pt remains without active epistaxis.  Chest x-ray with new right upper lobe density-- recommended close followup with repeat films.  Patient has remained stable in the ED and will be discharged back to facility.  We'll follow up with ENT regarding nasal fractures. We'll followup with PCP regarding INR and questionable lung nodule-- copies of labs and imaging provided for physician review.  Report called to facility, transfer via [facility].  Discussed with Dr. Audie Pinto who personally evaluated pt and agrees with plan of care.  Larene Pickett, PA-C 08/29/13 1346

## 2013-08-29 NOTE — ED Notes (Addendum)
PER EMS: pt from [place] home, was found on the floor with blood coming from her nose this morning around about an hour ago. Pt has dementia and mentation is at baseline and is nonverbal and does not usually follow commands. Epistaxis under control at this time by EMS but reports swelling around nose. Pt presents to [facility] with C-collar in place. Pt in A-fib with hx of Afib, HR between 60-90 beats per minute. 18g IV LAC. Pt on warfarin. BP-184/98, RR-20, O2-96%, CBG-193. Unknown LOC.

## 2013-08-29 NOTE — ED Notes (Addendum)
Pt has a greenish echymotic area under left breast approx the size of an orange- no other bruising noted. Lattie Haw, [place] notified.

## 2013-08-29 NOTE — ED Notes (Signed)
[facility] notified to transport pt back to [facility].

## 2013-08-30 NOTE — ED Provider Notes (Signed)
Medical screening examination/treatment/procedure(s) were conducted as a shared visit with non-physician practitioner(s) and myself.  I personally evaluated the patient during the encounter   .Face to face Exam:  General: Alert HEENT: Nasal swelling and tenderness Resp:  Normal effort Abd:  Nondistended Neuro:No focal deficits    Dot Lanes, MD 08/30/13 [phone]

## 2013-09-03 ENCOUNTER — Encounter: Payer: Self-pay | Admitting: *Deleted

## 2013-10-03 ENCOUNTER — Telehealth: Payer: Self-pay | Admitting: *Deleted

## 2013-10-03 NOTE — Telephone Encounter (Signed)
Spoke with son and patient is now in a nursing home.

## 2013-10-18 ENCOUNTER — Telehealth: Payer: Self-pay

## 2013-10-18 NOTE — Telephone Encounter (Signed)
Diabetic bundle  Pt is seeing cardiology on 11-07-13-they will address BP  A1C done on 08-07-13 to early for another test  Can discuss LDL (last done on 01-18-13 #112) at next appt. Pt is noncompliant

## 2013-11-08 ENCOUNTER — Encounter: Payer: Self-pay | Admitting: Internal Medicine

## 2013-11-11 ENCOUNTER — Encounter: Payer: Self-pay | Admitting: Internal Medicine

## 2013-11-21 LAB — POCT INR: INR: 2.4

## 2013-11-22 ENCOUNTER — Telehealth: Payer: Self-pay | Admitting: *Deleted

## 2013-11-22 ENCOUNTER — Ambulatory Visit (INDEPENDENT_AMBULATORY_CARE_PROVIDER_SITE_OTHER): Payer: Medicare Other | Admitting: Pharmacist

## 2013-11-22 DIAGNOSIS — D7389 Other diseases of spleen: Secondary | ICD-10-CM

## 2013-11-22 DIAGNOSIS — D735 Infarction of spleen: Secondary | ICD-10-CM

## 2013-11-22 DIAGNOSIS — Z5181 Encounter for therapeutic drug level monitoring: Secondary | ICD-10-CM

## 2013-11-22 NOTE — Telephone Encounter (Signed)
Left smg on triage stating pt has pressure ulcer on her right heel, its black, with odor. She is also c/o no appetite. Called nurse back no answer LMOM pt need to make appt haven't seen md since Feb. pls call to make appt...[name]

## 2013-11-23 ENCOUNTER — Encounter (HOSPITAL_COMMUNITY): Payer: Self-pay | Admitting: Emergency Medicine

## 2013-11-23 DIAGNOSIS — Z7901 Long term (current) use of anticoagulants: Secondary | ICD-10-CM

## 2013-11-23 DIAGNOSIS — F0390 Unspecified dementia without behavioral disturbance: Secondary | ICD-10-CM | POA: Diagnosis present

## 2013-11-23 DIAGNOSIS — M79609 Pain in unspecified limb: Secondary | ICD-10-CM | POA: Diagnosis not present

## 2013-11-23 DIAGNOSIS — F0280 Dementia in other diseases classified elsewhere without behavioral disturbance: Secondary | ICD-10-CM | POA: Diagnosis present

## 2013-11-23 DIAGNOSIS — Z950 Presence of cardiac pacemaker: Secondary | ICD-10-CM

## 2013-11-23 DIAGNOSIS — Z823 Family history of stroke: Secondary | ICD-10-CM

## 2013-11-23 DIAGNOSIS — Z86718 Personal history of other venous thrombosis and embolism: Secondary | ICD-10-CM

## 2013-11-23 DIAGNOSIS — Z888 Allergy status to other drugs, medicaments and biological substances status: Secondary | ICD-10-CM

## 2013-11-23 DIAGNOSIS — E1165 Type 2 diabetes mellitus with hyperglycemia: Secondary | ICD-10-CM

## 2013-11-23 DIAGNOSIS — L02619 Cutaneous abscess of unspecified foot: Principal | ICD-10-CM | POA: Diagnosis present

## 2013-11-23 DIAGNOSIS — Z833 Family history of diabetes mellitus: Secondary | ICD-10-CM

## 2013-11-23 DIAGNOSIS — G309 Alzheimer's disease, unspecified: Secondary | ICD-10-CM | POA: Diagnosis present

## 2013-11-23 DIAGNOSIS — I10 Essential (primary) hypertension: Secondary | ICD-10-CM | POA: Diagnosis present

## 2013-11-23 DIAGNOSIS — L8993 Pressure ulcer of unspecified site, stage 3: Secondary | ICD-10-CM | POA: Diagnosis present

## 2013-11-23 DIAGNOSIS — L8995 Pressure ulcer of unspecified site, unstageable: Secondary | ICD-10-CM | POA: Diagnosis present

## 2013-11-23 DIAGNOSIS — I4891 Unspecified atrial fibrillation: Secondary | ICD-10-CM | POA: Diagnosis present

## 2013-11-23 DIAGNOSIS — IMO0001 Reserved for inherently not codable concepts without codable children: Secondary | ICD-10-CM | POA: Diagnosis present

## 2013-11-23 DIAGNOSIS — D649 Anemia, unspecified: Secondary | ICD-10-CM | POA: Diagnosis present

## 2013-11-23 DIAGNOSIS — L89609 Pressure ulcer of unspecified heel, unspecified stage: Secondary | ICD-10-CM | POA: Diagnosis present

## 2013-11-23 DIAGNOSIS — N390 Urinary tract infection, site not specified: Secondary | ICD-10-CM | POA: Diagnosis present

## 2013-11-23 DIAGNOSIS — N179 Acute kidney failure, unspecified: Secondary | ICD-10-CM | POA: Diagnosis present

## 2013-11-23 DIAGNOSIS — R1310 Dysphagia, unspecified: Secondary | ICD-10-CM | POA: Diagnosis present

## 2013-11-23 DIAGNOSIS — Z794 Long term (current) use of insulin: Secondary | ICD-10-CM

## 2013-11-23 DIAGNOSIS — L97409 Non-pressure chronic ulcer of unspecified heel and midfoot with unspecified severity: Secondary | ICD-10-CM | POA: Diagnosis present

## 2013-11-23 DIAGNOSIS — L03119 Cellulitis of unspecified part of limb: Principal | ICD-10-CM

## 2013-11-23 LAB — I-STAT CHEM 8, ED
BUN: 31 mg/dL — ABNORMAL HIGH (ref 6–23)
CREATININE: 1.6 mg/dL — AB (ref 0.50–1.10)
Calcium, Ion: 1.17 mmol/L (ref 1.13–1.30)
Chloride: 115 mEq/L — ABNORMAL HIGH (ref 96–112)
GLUCOSE: 390 mg/dL — AB (ref 70–99)
HCT: 40 % (ref 36.0–46.0)
HEMOGLOBIN: 13.6 g/dL (ref 12.0–15.0)
Potassium: 4.5 mEq/L (ref 3.7–5.3)
Sodium: 144 mEq/L (ref 137–147)
TCO2: 24 mmol/L (ref 0–100)

## 2013-11-23 LAB — CBC WITH DIFFERENTIAL/PLATELET
Basophils Absolute: 0 10*3/uL (ref 0.0–0.1)
Basophils Relative: 1 % (ref 0–1)
EOS ABS: 0.1 10*3/uL (ref 0.0–0.7)
Eosinophils Relative: 2 % (ref 0–5)
HCT: 38.3 % (ref 36.0–46.0)
HEMOGLOBIN: 12.1 g/dL (ref 12.0–15.0)
LYMPHS ABS: 2.1 10*3/uL (ref 0.7–4.0)
LYMPHS PCT: 30 % (ref 12–46)
MCH: 27.4 pg (ref 26.0–34.0)
MCHC: 31.6 g/dL (ref 30.0–36.0)
MCV: 86.7 fL (ref 78.0–100.0)
MONOS PCT: 8 % (ref 3–12)
Monocytes Absolute: 0.6 10*3/uL (ref 0.1–1.0)
NEUTROS PCT: 59 % (ref 43–77)
Neutro Abs: 4.3 10*3/uL (ref 1.7–7.7)
Platelets: 218 10*3/uL (ref 150–400)
RBC: 4.42 MIL/uL (ref 3.87–5.11)
RDW: 13.3 % (ref 11.5–15.5)
WBC: 7.1 10*3/uL (ref 4.0–10.5)

## 2013-11-23 LAB — I-STAT CG4 LACTIC ACID, ED: Lactic Acid, Venous: 2.05 mmol/L (ref 0.5–2.2)

## 2013-11-23 NOTE — ED Notes (Signed)
I Stat LActic Acid results shown to Dr. Penni Bombard

## 2013-11-23 NOTE — ED Notes (Signed)
Patient was released from nursing home on Monday, patient's family started smelling a bad odor, noted that there was an open wound on right heal.  Patient's family was told that she just had swelling in ankle.  Patient does not speak, does not communicate.  Patient also has a rash on buttocks.

## 2013-11-24 ENCOUNTER — Emergency Department (HOSPITAL_COMMUNITY): Payer: Medicare Other

## 2013-11-24 ENCOUNTER — Inpatient Hospital Stay (HOSPITAL_COMMUNITY)
Admission: EM | Admit: 2013-11-24 | Discharge: 2013-11-27 | DRG: 602 | Disposition: A | Discharge status: Home / Self Care | Payer: Medicare Other | Attending: Internal Medicine | Admitting: Internal Medicine

## 2013-11-24 ENCOUNTER — Inpatient Hospital Stay (HOSPITAL_COMMUNITY): Payer: Medicare Other

## 2013-11-24 DIAGNOSIS — F0390 Unspecified dementia without behavioral disturbance: Secondary | ICD-10-CM | POA: Diagnosis present

## 2013-11-24 DIAGNOSIS — G9340 Encephalopathy, unspecified: Secondary | ICD-10-CM

## 2013-11-24 DIAGNOSIS — L97419 Non-pressure chronic ulcer of right heel and midfoot with unspecified severity: Secondary | ICD-10-CM

## 2013-11-24 DIAGNOSIS — F0280 Dementia in other diseases classified elsewhere without behavioral disturbance: Secondary | ICD-10-CM | POA: Diagnosis present

## 2013-11-24 DIAGNOSIS — N179 Acute kidney failure, unspecified: Secondary | ICD-10-CM | POA: Diagnosis present

## 2013-11-24 DIAGNOSIS — E43 Unspecified severe protein-calorie malnutrition: Secondary | ICD-10-CM

## 2013-11-24 DIAGNOSIS — Z515 Encounter for palliative care: Secondary | ICD-10-CM

## 2013-11-24 DIAGNOSIS — L02619 Cutaneous abscess of unspecified foot: Secondary | ICD-10-CM | POA: Diagnosis present

## 2013-11-24 DIAGNOSIS — Z823 Family history of stroke: Secondary | ICD-10-CM | POA: Diagnosis not present

## 2013-11-24 DIAGNOSIS — Z794 Long term (current) use of insulin: Secondary | ICD-10-CM | POA: Diagnosis not present

## 2013-11-24 DIAGNOSIS — E1101 Type 2 diabetes mellitus with hyperosmolarity with coma: Secondary | ICD-10-CM

## 2013-11-24 DIAGNOSIS — Z86718 Personal history of other venous thrombosis and embolism: Secondary | ICD-10-CM | POA: Diagnosis not present

## 2013-11-24 DIAGNOSIS — E870 Hyperosmolality and hypernatremia: Secondary | ICD-10-CM

## 2013-11-24 DIAGNOSIS — Z7901 Long term (current) use of anticoagulants: Secondary | ICD-10-CM | POA: Diagnosis not present

## 2013-11-24 DIAGNOSIS — L97409 Non-pressure chronic ulcer of unspecified heel and midfoot with unspecified severity: Secondary | ICD-10-CM | POA: Diagnosis present

## 2013-11-24 DIAGNOSIS — D649 Anemia, unspecified: Secondary | ICD-10-CM

## 2013-11-24 DIAGNOSIS — M79609 Pain in unspecified limb: Secondary | ICD-10-CM | POA: Diagnosis present

## 2013-11-24 DIAGNOSIS — I10 Essential (primary) hypertension: Secondary | ICD-10-CM

## 2013-11-24 DIAGNOSIS — I4891 Unspecified atrial fibrillation: Secondary | ICD-10-CM | POA: Diagnosis present

## 2013-11-24 DIAGNOSIS — D735 Infarction of spleen: Secondary | ICD-10-CM

## 2013-11-24 DIAGNOSIS — IMO0001 Reserved for inherently not codable concepts without codable children: Secondary | ICD-10-CM | POA: Diagnosis present

## 2013-11-24 DIAGNOSIS — Z833 Family history of diabetes mellitus: Secondary | ICD-10-CM | POA: Diagnosis not present

## 2013-11-24 DIAGNOSIS — Z9119 Patient's noncompliance with other medical treatment and regimen: Secondary | ICD-10-CM

## 2013-11-24 DIAGNOSIS — L89609 Pressure ulcer of unspecified heel, unspecified stage: Secondary | ICD-10-CM | POA: Diagnosis present

## 2013-11-24 DIAGNOSIS — R1310 Dysphagia, unspecified: Secondary | ICD-10-CM | POA: Diagnosis present

## 2013-11-24 DIAGNOSIS — Z91199 Patient's noncompliance with other medical treatment and regimen due to unspecified reason: Secondary | ICD-10-CM

## 2013-11-24 DIAGNOSIS — L8995 Pressure ulcer of unspecified site, unstageable: Secondary | ICD-10-CM | POA: Diagnosis present

## 2013-11-24 DIAGNOSIS — G309 Alzheimer's disease, unspecified: Secondary | ICD-10-CM | POA: Diagnosis present

## 2013-11-24 DIAGNOSIS — N390 Urinary tract infection, site not specified: Secondary | ICD-10-CM

## 2013-11-24 DIAGNOSIS — I480 Paroxysmal atrial fibrillation: Secondary | ICD-10-CM

## 2013-11-24 DIAGNOSIS — E785 Hyperlipidemia, unspecified: Secondary | ICD-10-CM

## 2013-11-24 DIAGNOSIS — E1149 Type 2 diabetes mellitus with other diabetic neurological complication: Secondary | ICD-10-CM

## 2013-11-24 DIAGNOSIS — Z950 Presence of cardiac pacemaker: Secondary | ICD-10-CM

## 2013-11-24 DIAGNOSIS — L8993 Pressure ulcer of unspecified site, stage 3: Secondary | ICD-10-CM | POA: Diagnosis present

## 2013-11-24 DIAGNOSIS — Z5181 Encounter for therapeutic drug level monitoring: Secondary | ICD-10-CM

## 2013-11-24 DIAGNOSIS — Z888 Allergy status to other drugs, medicaments and biological substances status: Secondary | ICD-10-CM | POA: Diagnosis not present

## 2013-11-24 LAB — URINALYSIS, ROUTINE W REFLEX MICROSCOPIC
Bilirubin Urine: NEGATIVE
Ketones, ur: NEGATIVE mg/dL
Nitrite: POSITIVE — AB
PROTEIN: 30 mg/dL — AB
Specific Gravity, Urine: 1.027 (ref 1.005–1.030)
UROBILINOGEN UA: 0.2 mg/dL (ref 0.0–1.0)
pH: 7.5 (ref 5.0–8.0)

## 2013-11-24 LAB — GLUCOSE, CAPILLARY
GLUCOSE-CAPILLARY: 110 mg/dL — AB (ref 70–99)
Glucose-Capillary: 144 mg/dL — ABNORMAL HIGH (ref 70–99)
Glucose-Capillary: 160 mg/dL — ABNORMAL HIGH (ref 70–99)
Glucose-Capillary: 109 mg/dL — ABNORMAL HIGH (ref 70–99)

## 2013-11-24 LAB — SEDIMENTATION RATE: Sed Rate: 37 mm/hr — ABNORMAL HIGH (ref 0–22)

## 2013-11-24 LAB — URINE MICROSCOPIC-ADD ON

## 2013-11-24 LAB — CBC
HEMATOCRIT: 34.5 % — AB (ref 36.0–46.0)
Hemoglobin: 11 g/dL — ABNORMAL LOW (ref 12.0–15.0)
MCH: 27.6 pg (ref 26.0–34.0)
MCHC: 31.9 g/dL (ref 30.0–36.0)
MCV: 86.5 fL (ref 78.0–100.0)
PLATELETS: 192 10*3/uL (ref 150–400)
RBC: 3.99 MIL/uL (ref 3.87–5.11)
RDW: 13.3 % (ref 11.5–15.5)
WBC: 6.5 10*3/uL (ref 4.0–10.5)

## 2013-11-24 LAB — COMPREHENSIVE METABOLIC PANEL
ALBUMIN: 3.2 g/dL — AB (ref 3.5–5.2)
ALT: 9 U/L (ref 0–35)
AST: 13 U/L (ref 0–37)
Alkaline Phosphatase: 61 U/L (ref 39–117)
Anion gap: 11 (ref 5–15)
BILIRUBIN TOTAL: 0.4 mg/dL (ref 0.3–1.2)
BUN: 20 mg/dL (ref 6–23)
CHLORIDE: 113 meq/L — AB (ref 96–112)
CO2: 24 mEq/L (ref 19–32)
CREATININE: 0.96 mg/dL (ref 0.50–1.10)
Calcium: 8.9 mg/dL (ref 8.4–10.5)
GFR calc Af Amer: 66 mL/min — ABNORMAL LOW (ref >90)
GFR, EST NON AFRICAN AMERICAN: 57 mL/min — AB (ref >90)
Glucose, Bld: 122 mg/dL — ABNORMAL HIGH (ref 70–99)
Potassium: 3.7 mEq/L (ref 3.7–5.3)
Sodium: 148 mEq/L — ABNORMAL HIGH (ref 137–147)
Total Protein: 7.1 g/dL (ref 6.0–8.3)

## 2013-11-24 LAB — PROTIME-INR
INR: 2.93 — AB (ref 0.00–1.49)
Prothrombin Time: 30.6 seconds — ABNORMAL HIGH (ref 11.6–15.2)

## 2013-11-24 LAB — CBG MONITORING, ED
GLUCOSE-CAPILLARY: 193 mg/dL — AB (ref 70–99)
Glucose-Capillary: 269 mg/dL — ABNORMAL HIGH (ref 70–99)

## 2013-11-24 MED ORDER — SODIUM CHLORIDE 0.9 % IV SOLN
INTRAVENOUS | Status: DC
  Administered 2013-11-24: 07:00:00 via INTRAVENOUS

## 2013-11-24 MED ORDER — SIMVASTATIN 20 MG PO TABS: 20.0000 mg | ORAL_TABLET | Freq: Every day | ORAL | Status: DC

## 2013-11-24 MED ORDER — INSULIN ASPART 100 UNIT/ML IV SOLN: 6.0000 [IU] | Freq: Once | INTRAVENOUS | Status: DC

## 2013-11-24 MED ORDER — INSULIN ASPART 100 UNIT/ML ~~LOC~~ SOLN: 6.0000 [IU] | Freq: Once | SUBCUTANEOUS | Status: DC

## 2013-11-24 MED ORDER — INSULIN DETEMIR 100 UNIT/ML ~~LOC~~ SOLN
20.0000 [IU] | Freq: Every day | SUBCUTANEOUS | Status: DC
  Administered 2013-11-24 – 2013-11-25 (×2): 20 [IU] via SUBCUTANEOUS
  Filled 2013-11-24 (×3): qty 0.2

## 2013-11-24 MED ORDER — MEMANTINE HCL ER 28 MG PO CP24
28.0000 mg | ORAL_CAPSULE | Freq: Every day | ORAL | Status: DC
  Administered 2013-11-24 – 2013-11-27 (×4): 28 mg via ORAL
  Filled 2013-11-24 (×4): qty 28

## 2013-11-24 MED ORDER — PRAVASTATIN SODIUM 40 MG PO TABS
40.0000 mg | ORAL_TABLET | Freq: Every day | ORAL | Status: DC
Start: 2013-11-24 — End: 2013-11-28
  Administered 2013-11-24 – 2013-11-27 (×4): 40 mg via ORAL
  Filled 2013-11-24 (×4): qty 1

## 2013-11-24 MED ORDER — PIPERACILLIN-TAZOBACTAM 3.375 G IVPB
3.3750 g | Freq: Three times a day (TID) | INTRAVENOUS | Status: DC
  Administered 2013-11-24 – 2013-11-26 (×7): 3.375 g via INTRAVENOUS
  Filled 2013-11-24 (×9): qty 50

## 2013-11-24 MED ORDER — VANCOMYCIN HCL 10 G IV SOLR
1250.0000 mg | INTRAVENOUS | Status: DC
  Administered 2013-11-24 – 2013-11-25 (×2): 1250 mg via INTRAVENOUS
  Filled 2013-11-24 (×3): qty 1250

## 2013-11-24 MED ORDER — WARFARIN - PHARMACIST DOSING INPATIENT
Freq: Every day | Status: DC
  Administered 2013-11-24 – 2013-11-27 (×2)

## 2013-11-24 MED ORDER — INSULIN ASPART 100 UNIT/ML ~~LOC~~ SOLN
0.0000 [IU] | Freq: Three times a day (TID) | SUBCUTANEOUS | Status: DC
  Administered 2013-11-24: 2 [IU] via SUBCUTANEOUS
  Administered 2013-11-24 – 2013-11-26 (×2): 1 [IU] via SUBCUTANEOUS
  Administered 2013-11-26: 2 [IU] via SUBCUTANEOUS
  Administered 2013-11-27: 3 [IU] via SUBCUTANEOUS
  Administered 2013-11-27 (×2): 1 [IU] via SUBCUTANEOUS

## 2013-11-24 MED ORDER — COLLAGENASE 250 UNIT/GM EX OINT
TOPICAL_OINTMENT | Freq: Every day | CUTANEOUS | Status: DC
  Administered 2013-11-24 – 2013-11-27 (×4): via TOPICAL
  Filled 2013-11-24: qty 30

## 2013-11-24 MED ORDER — WARFARIN SODIUM 4 MG PO TABS
4.0000 mg | ORAL_TABLET | Freq: Once | ORAL | Status: AC
  Administered 2013-11-24: 4 mg via ORAL
  Filled 2013-11-24: qty 1

## 2013-11-24 MED ORDER — IOHEXOL 300 MG/ML  SOLN
80.0000 mL | Freq: Once | INTRAMUSCULAR | Status: AC | PRN
  Administered 2013-11-24: 80 mL via INTRAVENOUS

## 2013-11-24 MED ORDER — SODIUM CHLORIDE 0.9 % IV SOLN: INTRAVENOUS | Status: AC

## 2013-11-24 MED ORDER — SODIUM CHLORIDE 0.9 % IV BOLUS (SEPSIS)
1000.0000 mL | Freq: Once | INTRAVENOUS | Status: AC
  Administered 2013-11-24: 1000 mL via INTRAVENOUS

## 2013-11-24 NOTE — Progress Notes (Signed)
Patient seen and examined Patient nonverbal, with end-stage dementia Foul-smelling ulcer on her right heel Still draining  X-ray shows soft tissue ulceration Patient being initiated on antibiotics for presumed osteomyelitis and a urinary tract infection  Probable osteomyelitis/cellulitis/UTI Start the patient on vancomycin and Zosyn CT scan of the right foot to rule out osteomyelitis Wound care consultation pending  Diabetes mellitus type 2 Continue outpatient medications and sliding scale insulin  Alzheimer's disease  - ends stage   Essential hypertension, benign  - BP controlled    Paroxysmal atrial fibrillation/on anticoagulant  INR per pharmacy protocol  Paroxysmal atrial fibrillation  - currently NSR

## 2013-11-24 NOTE — Progress Notes (Signed)
ANTICOAGULATION CONSULT NOTE - Initial Consult  Pharmacy Consult for warfarin Indication: hx splenic infarct  Allergies  Allergen Reactions  . Ace Inhibitors     REACTION: cough    Patient Measurements: Height: 5\' 7"  (170.2 cm) Weight: 152 lb 11.2 oz (69.264 kg) IBW/kg (Calculated) : 61.6  Vital Signs: Temp: 97.4 F (36.3 C) (08/23 0507) Temp src: Axillary (08/23 0507) BP: 156/88 mmHg (08/23 0507) Pulse Rate: 71 (08/23 0507)  Labs:  Recent Labs  11/23/13 2220 11/23/13 2230 11/24/13 0623  HGB 12.1 13.6  --   HCT 38.3 40.0  --   PLT 218  --   --   LABPROT  --   --  30.6*  INR  --   --  2.93*  CREATININE  --  1.60*  --     Estimated Creatinine Clearance: 30.5 ml/min (by C-G formula based on Cr of 1.6).   Medical History: Past Medical History  Diagnosis Date  . Dysphagia   . Diabetes mellitus   . Alzheimer disease   . Hypokalemia   . Anemia   . 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 DVT (deep venous thrombosis)     [facility]  . Hypertension     Medications:  Prescriptions prior to admission  Medication Sig Dispense Refill  . insulin aspart (NOVOLOG FLEXPEN) 100 UNIT/ML FlexPen Inject 2-10 Units into the skin every morning. 200-250=2 units 251-300=4 units 301-350=6 units 351-400=8 units 401-500=10 units Over 400 call MD      . Insulin Detemir (LEVEMIR FLEXPEN) 100 UNIT/ML Pen Inject 20 Units into the skin every evening.      . Memantine HCl ER (NAMENDA XR) 28 MG CP24 Take 28 mg by mouth daily.      . potassium chloride SA (K-DUR,KLOR-CON) 20 MEQ tablet Take 20 mEq by mouth 2 (two) times daily.      . pravastatin (PRAVACHOL) 40 MG tablet Take 40 mg by mouth daily.      Marland Kitchen warfarin (COUMADIN) 4 MG tablet Take 4 mg by mouth daily.        Assessment: 29 yof presented to the ED with a heel ulcer. She is on chronic coumadin for hx splenic infarct. INR is therapeutic at 2.93 today. CBC is WNL and no bleeding noted.   Goal of Therapy:  INR 2-3    Plan:  1. Warfarin 4mg  PO x 1 tonight 2. Daily INR  Malessa Zartman, Rande Lawman 11/24/2013,8:21 AM

## 2013-11-24 NOTE — ED Provider Notes (Signed)
CSN: [id number]     Arrival date & time 11/23/13  2119 History   First MD Initiated Contact with Patient 11/24/13 0028     Chief Complaint  Patient presents with  . Wound Infection     (Consider location/radiation/quality/duration/timing/severity/associated sxs/prior Treatment) HPI Comments: 73 year old female with diabetes, Alzheimer's severe, blood pressure, atrial fibrillation, sepsis, encephalopathy history presents with skin wound. Patient was recently released from nursing on Monday due to financial issues and presents of family because they notice since arrival home she had ulcer on her right heel and redness around her groin. No fevers or vomiting. Unable to get details from patient due to Alzheimer's. No current antibiotics and patient at baseline mentally.  The history is provided by a caregiver and a relative.    Past Medical History  Diagnosis Date  . Dysphagia   . Diabetes mellitus   . Alzheimer disease   . Hypokalemia   . Anemia   . 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 DVT (deep venous thrombosis)     [facility]  . Hypertension    Past Surgical History  Procedure Laterality Date  . Syncope  05/2001    pacer  . Pacemaker placement     Family History  Problem Relation Age of Onset  . Alzheimer's disease    . Diabetes    . Stroke Father   . Diabetes Brother   . Seizures Brother   . Diabetes Brother    History  Substance Use Topics  . Smoking status: Never Smoker   . Smokeless tobacco: Not on file     Comment: Patient exposed to second hand smoke daily   . Alcohol Use: No   OB History   Grav Para Term Preterm Abortions TAB SAB Ect Mult Living                 Review of Systems  Unable to perform ROS: Dementia      Allergies  Ace inhibitors  Home Medications   Prior to Admission medications   Medication Sig Start Date End Date Taking? Authorizing Provider  insulin aspart (NOVOLOG FLEXPEN) 100 UNIT/ML FlexPen Inject 2-10 Units into the  skin every morning. 200-250=2 units 251-300=4 units 301-350=6 units 351-400=8 units 401-500=10 units Over 400 call MD   Yes Historical Provider, MD  Insulin Detemir (LEVEMIR FLEXPEN) 100 UNIT/ML Pen Inject 20 Units into the skin every evening.   Yes Historical Provider, MD  Memantine HCl ER (NAMENDA XR) 28 MG CP24 Take 28 mg by mouth daily.   Yes Historical Provider, MD  potassium chloride SA (K-DUR,KLOR-CON) 20 MEQ tablet Take 20 mEq by mouth 2 (two) times daily.   Yes Historical Provider, MD  pravastatin (PRAVACHOL) 40 MG tablet Take 40 mg by mouth daily.   Yes Historical Provider, MD  warfarin (COUMADIN) 4 MG tablet Take 4 mg by mouth daily.   Yes Historical Provider, MD   BP 162/80  Pulse 69  Temp(Src) 98.8 F (37.1 C) (Rectal)  Resp 15  SpO2 100% Physical Exam  Nursing note and vitals reviewed. Constitutional: She appears well-developed and well-nourished.  HENT:  Head: Normocephalic and atraumatic.  Dry mucous membranes  Eyes: Conjunctivae are normal. Right eye exhibits no discharge. Left eye exhibits no discharge.  Neck: Normal range of motion. Neck supple. No tracheal deviation present.  Cardiovascular: Normal rate and regular rhythm.   Pulmonary/Chest: Effort normal and breath sounds normal.  Abdominal: Soft. She exhibits no distension. There is no tenderness. There is no  guarding.  Musculoskeletal: She exhibits no edema.  Neurological: GCS eye subscore is 3. GCS verbal subscore is 3. GCS motor subscore is 5.  Pleasant severe dementia Mild decreased strength bilateral upper and lower extremities Patient does not follow commands and is not communicate which is her baseline. No verbal during my exam.  Skin: Skin is warm. No rash noted.  Psychiatric:  Pleasant dementia    ED Course  Procedures (including critical care time) Labs Review Labs Reviewed  SEDIMENTATION RATE - Abnormal; Notable for the following:    Sed Rate 37 (*)    All other components within normal  limits  URINALYSIS, ROUTINE W REFLEX MICROSCOPIC - Abnormal; Notable for the following:    APPearance TURBID (*)    Glucose, UA >1000 (*)    Hgb urine dipstick TRACE (*)    Protein, ur 30 (*)    Nitrite POSITIVE (*)    Leukocytes, UA MODERATE (*)    All other components within normal limits  URINE MICROSCOPIC-ADD ON - Abnormal; Notable for the following:    Bacteria, UA MANY (*)    Crystals TRIPLE PHOSPHATE CRYSTALS (*)    All other components within normal limits  PROTIME-INR - Abnormal; Notable for the following:    Prothrombin Time 30.6 (*)    INR 2.93 (*)    All other components within normal limits  I-STAT CHEM 8, ED - Abnormal; Notable for the following:    Chloride 115 (*)    BUN 31 (*)    Creatinine, Ser 1.60 (*)    Glucose, Bld 390 (*)    All other components within normal limits  CBG MONITORING, ED - Abnormal; Notable for the following:    Glucose-Capillary 269 (*)    All other components within normal limits  CBG MONITORING, ED - Abnormal; Notable for the following:    Glucose-Capillary 193 (*)    All other components within normal limits  CBC WITH DIFFERENTIAL  I-STAT CG4 LACTIC ACID, ED  I-STAT CG4 LACTIC ACID, ED    Imaging Review Dg Foot Complete Right  11/24/2013   CLINICAL DATA:  Wound infection  EXAM: RIGHT FOOT COMPLETE - 3+ VIEW  COMPARISON:  None.  FINDINGS: No acute fracture dislocation. Joint spaces are normally aligned. Scattered degenerative osteoarthritic changes present throughout the foot.  Soft tissue ulceration present at the posterior heel. No radiopaque foreign body. No dissecting soft tissue emphysema. No osseous erosion to suggest active osteomyelitis.  Vascular calcifications noted within the foot.  IMPRESSION: 1. Soft tissue ulceration at the posterior heel. No evidence of active osteomyelitis or dissecting soft tissue emphysema. 2. No acute fracture or dislocation. 3. Diffuse osteopenia.   Electronically Signed   By: Jeannine Boga M.D.    On: 11/24/2013 02:05     EKG Interpretation None      MDM   Final diagnoses:  Heel ulcer, right, with unspecified severity  AKI (acute kidney injury)   Patient has severe dementia presents with newly found skin ulcer since recent nursing home stay by family. Kidney function worsened, clinically dehydrated, IV fluids given. Altered mental/dementia at baseline. Skin ulcer appears to be Mild to moderate however does have an odor, mild elevated sedimentation rate, x-ray unremarkable no white blood cell count elevation. Likely plan for oral antibiotics. Diabetes not controlled hypoglycemic on IV fluids given.  Discussed the case to try Dr. Alcario Drought who evaluated the patient and together we are trying to come up with the best plan for this patient with possible  admission versus close outpatient followup with social/home health  The patients results and plan were reviewed and discussed.   Any x-rays performed were personally reviewed by myself.   Differential diagnosis were considered with the presenting HPI.  Medications  insulin aspart (novoLOG) injection 6 Units (6 Units Intravenous Not Given 11/24/13 0712)  0.9 %  sodium chloride infusion ( Intravenous New Bag/Given 11/24/13 0711)  insulin detemir (LEVEMIR) injection 20 Units (not administered)  Memantine HCl ER CP24 28 mg (not administered)  insulin aspart (novoLOG) injection 0-9 Units (not administered)  pravastatin (PRAVACHOL) tablet 40 mg (not administered)  sodium chloride 0.9 % bolus 1,000 mL (0 mLs Intravenous Stopped 11/24/13 0439)     Filed Vitals:   11/24/13 0115 11/24/13 0130 11/24/13 0233 11/24/13 0507  BP: 166/86 162/80  156/88  Pulse: 75 69  71  Temp:   98.8 F (37.1 C) 97.4 F (36.3 C)  TempSrc:   Rectal Axillary  Resp: 15 15  20   Height:    5\' 7"  (1.702 m)  Weight:    152 lb 11.2 oz (69.264 kg)  SpO2: 100% 100%  100%    Admission/ observation were discussed with the admitting physician, patient and/or  family and they are comfortable with the plan.       Mariea Clonts, MD 11/24/13 [phone]

## 2013-11-24 NOTE — Progress Notes (Signed)
ANTIBIOTIC CONSULT NOTE - INITIAL  Pharmacy Consult for Vancomycin and Zosyn Indication: heel ulcer  Allergies  Allergen Reactions  . Ace Inhibitors     REACTION: cough    Patient Measurements: Height: 5\' 7"  (170.2 cm) Weight: 152 lb 11.2 oz (69.264 kg) IBW/kg (Calculated) : 61.6  Vital Signs: Temp: 97.4 F (36.3 C) (08/23 0507) Temp src: Axillary (08/23 0507) BP: 156/88 mmHg (08/23 0507) Pulse Rate: 71 (08/23 0507) Intake/Output from previous day: 08/22 0701 - 08/23 0700 In: 1000 [I.V.:1000] Out: -  Intake/Output from this shift:    Labs:  Recent Labs  11/23/13 2220 11/23/13 2230 11/24/13 1019  WBC 7.1  --  6.5  HGB 12.1 13.6 11.0*  PLT 218  --  192  CREATININE  --  1.60* 0.96   Estimated Creatinine Clearance: 50.8 ml/min (by C-G formula based on Cr of 0.96). No results found for this basename: VANCOTROUGH, VANCOPEAK, VANCORANDOM, GENTTROUGH, GENTPEAK, GENTRANDOM, TOBRATROUGH, TOBRAPEAK, TOBRARND, AMIKACINPEAK, AMIKACINTROU, AMIKACIN,  in the last 72 hours   Microbiology: No results found for this or any previous visit (from the past 720 hour(s)).  Medical History: Past Medical History  Diagnosis Date  . Dysphagia   . Diabetes mellitus   . Alzheimer disease   . Hypokalemia   . Anemia   . 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 DVT (deep venous thrombosis)     [facility]  . Hypertension     Medications:  Anti-infectives   None     Assessment: 73 year old female admitted with a heel ulcer and possible osteomyelitis to begin empiric Vancomycin and Zosyn.  Her creatinine was elevated on admission and but has now trended down.  It appears she is quite debilitated at baseline so I'm not sure how well creatinine will help [place] estimate her clearance.  Goal of Therapy:  Vancomycin trough level 15-20 mcg/ml  Plan:  Vancomycin 1250mg  IV q24h Zosyn 3.375gm IV q8h extended infusion Monitor renal function Follow available micro data  Legrand Como,  [name], BCPS, AAHIVP Clinical Pharmacist Phone: [phone] or [phone] 11/24/2013, 2:06 PM

## 2013-11-24 NOTE — H&P (Signed)
[facility] History and Physical  KENDY HASTON CVE:[id number] DOB: 04-24-1940 DOA: 11/24/2013  Referring physician: EDP PCP: Unice Cobble, MD   Chief Complaint: Heel ulcer   HPI: [name] is a 73 y.o. female who was recently taken home from her SNF by family due to the fact that insurance wouldn't continue to pay for SNF.  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  After family took her home last weekend they noted she had a right heel ulcer, foul smelling with surrounding erythema.  They took the dressing off, offloaded it, let it dry out, and cleaned it up with alcohol pads.  Over the next couple of days ulcer improved, surrounding erythema and foul odor resolved, and patient started eating better.  Still, there is an eschar present and ulcer that the family isnt sure how to take care of, so they brought her in to the ED today.  Review of Systems: Systems reviewed.  As above, otherwise negative  Past Medical History  Diagnosis Date  . Dysphagia   . Diabetes mellitus   . Alzheimer disease   . Hypokalemia   . Anemia   . 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 DVT (deep venous thrombosis)     [facility]  . Hypertension    Past Surgical History  Procedure Laterality Date  . Syncope  05/2001    pacer  . Pacemaker placement     Social History:  reports that she has never smoked. She does not have any smokeless tobacco history on file. She reports that she does not drink alcohol or use illicit drugs.  Allergies  Allergen Reactions  . Ace Inhibitors     REACTION: cough    Family History  Problem Relation Age of Onset  . Alzheimer's disease    . Diabetes    . Stroke Father   . Diabetes Brother   . Seizures Brother   . Diabetes Brother      Prior to Admission medications   Medication Sig Start Date End Date Taking? Authorizing Provider  insulin aspart (NOVOLOG FLEXPEN) 100 UNIT/ML FlexPen Inject 2-10 Units into the skin every morning.  200-250=2 units 251-300=4 units 301-350=6 units 351-400=8 units 401-500=10 units Over 400 call MD   Yes Historical Provider, MD  Insulin Detemir (LEVEMIR FLEXPEN) 100 UNIT/ML Pen Inject 20 Units into the skin every evening.   Yes Historical Provider, MD  Memantine HCl ER (NAMENDA XR) 28 MG CP24 Take 28 mg by mouth daily.   Yes Historical Provider, MD  potassium chloride SA (K-DUR,KLOR-CON) 20 MEQ tablet Take 20 mEq by mouth 2 (two) times daily.   Yes Historical Provider, MD  pravastatin (PRAVACHOL) 40 MG tablet Take 40 mg by mouth daily.   Yes Historical Provider, MD  warfarin (COUMADIN) 4 MG tablet Take 4 mg by mouth daily.   Yes Historical Provider, MD   Physical Exam: Filed Vitals:   11/24/13 0233  BP:   Pulse:   Temp: 98.8 F (37.1 C)  Resp:     BP 162/80  Pulse 69  Temp(Src) 98.8 F (37.1 C) (Rectal)  Resp 15  SpO2 100%  General Appearance:    Alert, non-verbal plesantly confused picking at sheets, IV line, and anything else she can get her hands on, no distress, appears stated age  Head:    Normocephalic, atraumatic  Eyes:    PERRL, EOMI, sclera non-icteric        Nose:   Nares without drainage or  epistaxis. Mucosa, turbinates normal  Throat:   Moist mucous membranes. Oropharynx without erythema or exudate.  Neck:   Supple. No carotid bruits.  No thyromegaly.  No lymphadenopathy.   Back:     No CVA tenderness, no spinal tenderness  Lungs:     Clear to auscultation bilaterally, without wheezes, rhonchi or rales  Chest wall:    No tenderness to palpitation  Heart:    Regular rate and rhythm without murmurs, gallops, rubs  Abdomen:     Soft, non-tender, nondistended, normal bowel sounds, no organomegaly  Genitalia:    deferred  Rectal:    deferred  Extremities:   No clubbing, cyanosis or edema.  Left heel ulcer with overlying dry eschar, no surrounding erythema nor edema.  Pulses:   2+ and symmetric all extremities  Skin:   Skin color, texture, turgor normal, no rashes  or lesions  Lymph nodes:   Cervical, supraclavicular, and axillary nodes normal  Neurologic:   CNII-XII intact. Normal strength, sensation and reflexes      throughout    Labs on Admission:  Basic Metabolic Panel:  Recent Labs Lab 11/23/13 2230  NA 144  K 4.5  CL 115*  GLUCOSE 390*  BUN 31*  CREATININE 1.60*   Liver Function Tests: No results found for this basename: AST, ALT, ALKPHOS, BILITOT, PROT, ALBUMIN,  in the last 168 hours No results found for this basename: LIPASE, AMYLASE,  in the last 168 hours No results found for this basename: AMMONIA,  in the last 168 hours CBC:  Recent Labs Lab 11/23/13 2220 11/23/13 2230  WBC 7.1  --   NEUTROABS 4.3  --   HGB 12.1 13.6  HCT 38.3 40.0  MCV 86.7  --   PLT 218  --    Cardiac Enzymes: No results found for this basename: CKTOTAL, CKMB, CKMBINDEX, TROPONINI,  in the last 168 hours  BNP (last 3 results) No results found for this basename: PROBNP,  in the last 8760 hours CBG:  Recent Labs Lab 11/24/13 0226  GLUCAP 269*    Radiological Exams on Admission: Dg Foot Complete Right  11/24/2013   CLINICAL DATA:  Wound infection  EXAM: RIGHT FOOT COMPLETE - 3+ VIEW  COMPARISON:  None.  FINDINGS: No acute fracture dislocation. Joint spaces are normally aligned. Scattered degenerative osteoarthritic changes present throughout the foot.  Soft tissue ulceration present at the posterior heel. No radiopaque foreign body. No dissecting soft tissue emphysema. No osseous erosion to suggest active osteomyelitis.  Vascular calcifications noted within the foot.  IMPRESSION: 1. Soft tissue ulceration at the posterior heel. No evidence of active osteomyelitis or dissecting soft tissue emphysema. 2. No acute fracture or dislocation. 3. Diffuse osteopenia.   Electronically Signed   By: Jeannine Boga M.D.   On: 11/24/2013 02:05    EKG: Independently reviewed.  Assessment/Plan Active Problems:   Heel ulcer   AKI (acute kidney  injury)   1. Heel ulcer - sounds like the heel ulcer was infected earlier this week; however, by the time the patient is evaluated in the ED, the infection has already resolved.  The surrounding cellulitis that sounds like it was present earlier this week associated with poor PO intake Mon-Wed, is now absent on exam.  Open wound is of course at risk for future infections.  Will admit patient for wound care consult as well as home health consult. 2. AKI - Patient has AKI, likely pre-renal from poor PO intake earlier this week, hydrating patient,  I suspect that this is already improving and we are catching a resolving AKI today on work up.   Code Status: Full Code  Family Communication: Daughter at bedside Disposition Plan: Admit to obs   Time spent: 50 min  GARDNER, JARED M. [facility] Pager [phone]  If 7AM-7PM, please contact the day team taking care of the patient [email or web address] Password [email or web address] 11/24/2013, 4:00 AM

## 2013-11-24 NOTE — ED Notes (Signed)
Report called to unit 6N.

## 2013-11-24 NOTE — Consult Note (Signed)
WOC wound consult note Reason for Consult: Pressure Ulcer (Unstageable) right heel. Wound type:Initially Unstageable Pressure Ulcer (PrU), post debridement, Stage III PrU Pressure Ulcer POA: Yes Measurement:3.5cm x 3cm x 0.4cm Wound bed: Initially completely obscured by loosely adherent eschar.  Post CSWD (conservative sharp wound debridement), wound bed reveals a Stage III PrU with 30% yellow slough and 70% pink, pale, wound bed. Drainage (amount, consistency, odor) none Periwound:intact, dry. Dressing procedure/placement/frequency: An enzymatic debriding agent (collagenase/Santyl) is ordered to remove the fibrinous slough that remains in the wound bed following debridement.  Pressure redistribution heel boots are provided bilaterally to enhance healing and prevent pressure ulceration on the left heel. Conservative sharp wound debridement (CSWD performed at the bedside): yes.  Loosely adherent black eschar obscures wound bed and is foul smelling.  Same is debrided easily using conservative sharp wound debridement technique and separates in one piece with no bleeding.  Patient tolerated procedure well.  Local wound care is ordered and will be exponentially more effective with the eschar removed. [place] nursing team will not follow, but will remain available to this patient, the nursing and medical teams.  Please re-consult if needed. Thanks, Maudie Flakes, MSN, RN, [place], [place], [place] [phone])

## 2013-11-24 NOTE — Progress Notes (Signed)
UR completed 

## 2013-11-25 LAB — BASIC METABOLIC PANEL
Anion gap: 12 (ref 5–15)
BUN: 15 mg/dL (ref 6–23)
CHLORIDE: 112 meq/L (ref 96–112)
CO2: 24 meq/L (ref 19–32)
CREATININE: 0.91 mg/dL (ref 0.50–1.10)
Calcium: 8.9 mg/dL (ref 8.4–10.5)
GFR calc Af Amer: 71 mL/min — ABNORMAL LOW (ref >90)
GFR calc non Af Amer: 61 mL/min — ABNORMAL LOW (ref >90)
GLUCOSE: 97 mg/dL (ref 70–99)
Potassium: 3.5 mEq/L — ABNORMAL LOW (ref 3.7–5.3)
Sodium: 148 mEq/L — ABNORMAL HIGH (ref 137–147)

## 2013-11-25 LAB — GLUCOSE, CAPILLARY
GLUCOSE-CAPILLARY: 70 mg/dL (ref 70–99)
GLUCOSE-CAPILLARY: 109 mg/dL — AB (ref 70–99)
GLUCOSE-CAPILLARY: 118 mg/dL — AB (ref 70–99)
Glucose-Capillary: 98 mg/dL (ref 70–99)

## 2013-11-25 LAB — PROTIME-INR
INR: 2.68 — AB (ref 0.00–1.49)
Prothrombin Time: 28.5 seconds — ABNORMAL HIGH (ref 11.6–15.2)

## 2013-11-25 MED ORDER — PRO-STAT SUGAR FREE PO LIQD
30.0000 mL | Freq: Three times a day (TID) | ORAL | Status: DC
  Administered 2013-11-25 – 2013-11-27 (×7): 30 mL
  Filled 2013-11-25 (×8): qty 30

## 2013-11-25 MED ORDER — ADULT MULTIVITAMIN W/MINERALS CH
1.0000 | ORAL_TABLET | Freq: Every day | ORAL | Status: DC
  Administered 2013-11-25 – 2013-11-27 (×3): 1 via ORAL
  Filled 2013-11-25 (×3): qty 1

## 2013-11-25 MED ORDER — WARFARIN SODIUM 4 MG PO TABS
4.0000 mg | ORAL_TABLET | Freq: Every day | ORAL | Status: DC
  Administered 2013-11-25 – 2013-11-27 (×3): 4 mg via ORAL
  Filled 2013-11-25 (×3): qty 1

## 2013-11-25 NOTE — Progress Notes (Signed)
INITIAL NUTRITION ASSESSMENT  DOCUMENTATION CODES Per approved criteria  -Not Applicable   INTERVENTION:  Provide Prostat liquid protein PO 30 ml TID with meals, each supplement provides 100 kcal, 15 grams protein.   Provide Multivitamin with minerals daily.    Will continue to monitor  NUTRITION DIAGNOSIS: Increased nutrient needs (protein) related to wound healing as evidenced by stage III pressure ulcer on right heel.   Goal: Pt to meet >/= 90% of their estimated nutrition needs   Monitor:  PO and supplemental intake, weight, labs, I/O's, wound status   Reason for Assessment: Pt identified as at nutrition risk on the Malnutrition Screen Tool   Admitting Dx: <principal problem not specified>  ASSESSMENT: 73 year old female with diabetes, Alzheimer's severe, blood pressure, atrial fibrillation, sepsis, encephalopathy history presents with skin wound. Patient was recently released from nursing on Monday due to financial issues and presents of family because they notice since arrival home she had ulcer on her right heel and redness around her groin. No fevers or vomiting. Unable to get details from patient due to Alzheimer's. No current antibiotics and patient at baseline mentally.  Unable to obtain pt wt history d/t pt is nonverbal. Per previous wt history via EPIC, pt has lost 20 lb x 1 year.  PT in room during visit, no family present. Currently on modified carb, soft diet. Per RN notes, PO intake: 30% yesterday for dinner. PMH of DM and dysphagia.   Pt has Stage III pressure ulcer located on right heel. Pt may benefit from protein supplement, multi-vitamin and vitamin C supplementation.   Nutrition focused physical exam shows no sign of depletion of muscle mass or body fat.  Labs reviewed:  High Sodium Low Potassium    Height: Ht Readings from Last 1 Encounters:  11/24/13 5\' 7"  (1.702 m)    Weight: Wt Readings from Last 1 Encounters:  11/24/13 152 lb 11.2 oz  (69.264 kg)    Ideal Body Weight: 135 lb  % Ideal Body Weight: 113%  Wt Readings from Last 10 Encounters:  11/24/13 152 lb 11.2 oz (69.264 kg)  08/06/13 146 lb 6.2 oz (66.4 kg)  05/15/13 150 lb (68.04 kg)  05/07/13 171 lb (77.565 kg)  04/23/13 170 lb 1.5 oz (77.155 kg)  01/18/13 172 lb (78.019 kg)  10/30/12 170 lb (77.111 kg)  12/16/11 179 lb 12.8 oz (81.557 kg)  11/22/11 178 lb (80.74 kg)  10/05/10 200 lb (90.719 kg)    Usual Body Weight: 200 lb, per documentation  % Usual Body Weight: 76%  BMI:  Body mass index is 23.91 kg/(m^2).  Estimated Nutritional Needs: Kcal: 2100-2200 Protein: 100-110g Fluid: >1.5L/day  Skin: Left heel ulcer (Stage III) with overlying dry eschar, no surrounding erythema nor edema.    Diet Order: Criss Rosales, Mod Carb  EDUCATION NEEDS: -No education needs identified at this time   Intake/Output Summary (Last 24 hours) at 11/25/13 1209 Last data filed at 11/25/13 0531  Gross per 24 hour  Intake   1763 ml  Output      0 ml  Net   1763 ml    Last BM: 8/22   Labs:   Recent Labs Lab 11/23/13 2230 11/24/13 1019 11/25/13 0547  NA 144 148* 148*  K 4.5 3.7 3.5*  CL 115* 113* 112  CO2  --  24 24  BUN 31* 20 15  CREATININE 1.60* 0.96 0.91  CALCIUM  --  8.9 8.9  GLUCOSE 390* 122* 97    CBG (last 3)  Recent Labs  11/24/13 1720 11/24/13 2254 11/25/13 0752  GLUCAP 160* 109* 70    Scheduled Meds: . collagenase   Topical Daily  . insulin aspart  0-9 Units Subcutaneous TID WC  . insulin aspart  6 Units Intravenous Once  . insulin detemir  20 Units Subcutaneous Q2200  . Memantine HCl ER  28 mg Oral Daily  . piperacillin-tazobactam (ZOSYN)  IV  3.375 g Intravenous 3 times per day  . pravastatin  40 mg Oral Daily  . vancomycin  1,250 mg Intravenous Q24H  . warfarin  4 mg Oral q1800  . Warfarin - Pharmacist Dosing Inpatient   Does not apply q1800    Continuous Infusions:   Past Medical History  Diagnosis Date  . Dysphagia    . Diabetes mellitus   . Alzheimer disease   . Hypokalemia   . Anemia   . 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 DVT (deep venous thrombosis)     [facility]  . Hypertension     Past Surgical History  Procedure Laterality Date  . Syncope  05/2001    pacer  . Pacemaker placement     Clayton Bibles, [place], [place] Licensed Dietitian Nutritionist Pager: [phone]

## 2013-11-25 NOTE — Progress Notes (Signed)
Note/chart reviewed.  Katie Draylon Mercadel, RD, LDN Pager #: [phone] After-Hours Pager #: [phone]  

## 2013-11-25 NOTE — Care Management Note (Signed)
  Page 1 of 1   11/27/2013     11:12:22 AM CARE MANAGEMENT NOTE 11/27/2013  Patient:  OSMARA, DRUMMONDS   Account Number:  [id number]  Date Initiated:  11/25/2013  Documentation initiated by:  Magdalen Spatz  Subjective/Objective Assessment:     Action/Plan:   Anticipated DC Date:  11/27/2013   Anticipated DC Plan:  SKILLED NURSING FACILITY  In-house referral  Clinical Social Worker         Choice offered to / List presented to:  C-3 Spouse        HH arranged  HH-1 RN  [place]      [place] [facility]  [place]   Status of service:  Completed, signed off Medicare Important Message given?  YES (If response is "NO", the following Medicare IM given date fields will be blank) Date Medicare IM given:  11/26/2013 Medicare IM given by:  Magdalen Spatz Date Additional Medicare IM given:   Additional Medicare IM given by:    Discharge Disposition:    Per UR Regulation:    If discussed at Long Length of Stay Meetings, dates discussed:    Comments:  11-27-13 Confirmed with patient's husband at bedside , he wants to take patient home with [place] home health and not SNF. Patient's husband states he has assistance at home to help him. Magdalen Spatz RN BSN [phone]    11-25-13 MD noted DC to SNF tomorrow 11-16-13 . Entered SW consent and left [facility] SW voice mail. Magdalen Spatz RN BSN [phone]

## 2013-11-25 NOTE — Progress Notes (Signed)
ANTICOAGULATION CONSULT NOTE - Follow-up Consult  Pharmacy Consult for warfarin Indication: hx splenic infarct  Allergies  Allergen Reactions  . Ace Inhibitors     REACTION: cough    Patient Measurements: Height: 5\' 7"  (170.2 cm) Weight: 152 lb 11.2 oz (69.264 kg) IBW/kg (Calculated) : 61.6  Vital Signs: Temp: 97.4 F (36.3 C) (08/24 0635) Temp src: Axillary (08/24 0635) BP: 148/73 mmHg (08/24 0635) Pulse Rate: 58 (08/24 0635)  Labs:  Recent Labs  11/23/13 2220 11/23/13 2230 11/24/13 0623 11/24/13 1019 11/25/13 0547  HGB 12.1 13.6  --  11.0*  --   HCT 38.3 40.0  --  34.5*  --   PLT 218  --   --  192  --   LABPROT  --   --  30.6*  --  28.5*  INR  --   --  2.93*  --  2.68*  CREATININE  --  1.60*  --  0.96 0.91    Estimated Creatinine Clearance: 53.5 ml/min (by C-G formula based on Cr of 0.91).  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 INR remains therapeutic on home dose. Hgb down a bit bit. No bleeding noted.   Goal of Therapy:  INR 2-3   Plan:  1. Restart home dose of Warfarin 4mg  po daily 2. Daily INR  Sherlon Handing, PharmD, BCPS Clinical pharmacist, pager [phone] 11/25/2013,9:35 AM

## 2013-11-25 NOTE — Evaluation (Signed)
Physical Therapy Evaluation Patient Details Name: [name] MRN: [id number] DOB: March 01, 1941 Today's Date: 11/25/2013   History of Present Illness  [name] is a 73 y.o. female who was recently taken home from her SNF by family due to the fact that insurance wouldn't continue to pay for SNF.  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  After family took her home last weekend they noted she had a right heel ulcer, foul smelling with surrounding erythema.  They took the dressing off, offloaded it, let it dry out, and cleaned it up with alcohol pads.  Over the next couple of days ulcer improved, surrounding erythema and foul odor resolved, and patient started eating better.  Still, there is an eschar present and ulcer that the family isnt sure how to take care of, so they brought her in to the ED today.  Clinical Impression  Pt admitted with R heel ulcer. Pt currently with functional limitations due to the deficits listed below (see PT Problem List).  Pt will benefit from skilled PT to increase their independence and safety with mobility to allow discharge to the venue listed below.     Follow Up Recommendations SNF;Supervision/Assistance - 24 hour (HH if family able to provide assistance)    Equipment Recommendations  None recommended by PT (TBD)    Recommendations for Other Services       Precautions / Restrictions Precautions Precautions: Fall Restrictions Weight Bearing Restrictions: No      Mobility  Bed Mobility Overal bed mobility: Needs Assistance Bed Mobility: Supine to Sit;Sit to Supine     Supine to sit: Total assist;HOB elevated Sit to supine: Total assist   General bed mobility comments: decreased initiation and max cues for participation  Transfers Overall transfer level: Needs assistance Equipment used: None Transfers: Sit to/from Stand Sit to Stand: Max assist         General transfer comment: max A to initiate movement; pt able to  assist once movement initiated  Ambulation/Gait                Stairs            Wheelchair Mobility    Modified Rankin (Stroke Patients Only)       Balance Overall balance assessment: Needs assistance Sitting-balance support: Feet supported;Single extremity supported Sitting balance-Leahy Scale: Fair                                       Pertinent Vitals/Pain      Home Living Family/patient expects to be discharged to:: Private residence Living Arrangements:  (unknown due to cognition) Available Help at Discharge: Family (unknown if pt.has 24 hour care)             Additional Comments: Due to cognition pt. unable to state home environment.  Per MD note, pt. lived at home with family.    Prior Function Level of Independence: Needs assistance         Comments: Unknown due to cognition     Hand Dominance        Extremity/Trunk Assessment   Upper Extremity Assessment: Generalized weakness;Defer to OT evaluation           Lower Extremity Assessment: Generalized weakness;Difficult to assess due to impaired cognition      Cervical / Trunk Assessment: Kyphotic  Communication   Communication: Receptive difficulties;Expressive difficulties  Cognition Arousal/Alertness: Lethargic Behavior During Therapy: Flat affect;Restless Overall Cognitive Status: History of cognitive impairments - at baseline       Memory: Decreased recall of precautions;Decreased short-term memory              General Comments General comments (skin integrity, edema, etc.): Pt. did not open eyes once throughout session.  Anticipate pt. will participate more when pt. more alert    Exercises        Assessment/Plan    PT Assessment Patient needs continued PT services  PT Diagnosis Difficulty walking;Generalized weakness;Altered mental status   PT Problem List Decreased strength;Decreased activity tolerance;Decreased balance;Decreased  mobility;Decreased cognition;Decreased knowledge of use of DME;Decreased safety awareness;Decreased coordination;Decreased knowledge of precautions  PT Treatment Interventions DME instruction;Gait training;Functional mobility training;Therapeutic activities;Therapeutic exercise;Balance training;Neuromuscular re-education;Patient/family education   PT Goals (Current goals can be found in the Care Plan section) Acute Rehab PT Goals Patient Stated Goal: unable to state PT Goal Formulation: Patient unable to participate in goal setting Time For Goal Achievement: 12/09/13 Potential to Achieve Goals: Fair    Frequency Min 3X/week   Barriers to discharge Other (comment) (unknown at this time)      Co-evaluation               End of Session Equipment Utilized During Treatment: Gait belt Activity Tolerance: Patient limited by lethargy Patient left: in bed;with call bell/phone within reach;with bed alarm set Nurse Communication: Mobility status         Time: 5809-9833 PT Time Calculation (min): 18 min   Charges:   PT Evaluation $Initial PT Evaluation Tier I: 1 Procedure PT Treatments $Therapeutic Activity: 8-22 mins   PT G Codes:          Siri Cole 11/25/2013, 1:20 PM  Laureen Abrahams, PT, DPT [phone]

## 2013-11-25 NOTE — Progress Notes (Addendum)
[facility] PROGRESS NOTE  [name]:[id number] DOB: 1941/01/09 DOA: 11/24/2013 PCP: Unice Cobble, MD  Assessment/Plan: Active Problems:   Heel ulcer   AKI (acute kidney injury)     Probable osteomyelitis/cellulitis/UTI  Unstageable Pressure Ulcer (PrU), post debridement, Stage III  Started the patient on vancomycin and Zosyn  CT scan of the right foot shows Deep soft tissue ulceration over the heel. No evidence of soft  tissue abscess.  no evidence of osteomyelitis. Status post wound debridement by wound care consult    Diabetes mellitus type 2  Continue outpatient medications and sliding scale insulin   Alzheimer's disease  - ends stage  Essential hypertension, benign  - BP controlled   Paroxysmal atrial fibrillation/on anticoagulant  INR per pharmacy protocol   Paroxysmal atrial fibrillation  - currently NSR   Code Status: full Family Communication: family updated about patient's clinical progress Disposition Plan: Anticipate discharge to SNF tomorrow   Brief narrative: [name] is a 73 y.o. female who was recently taken home from her SNF by family due to the fact that insurance wouldn't continue to pay for SNF. 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 After family took her home last weekend they noted she had a right heel ulcer, foul smelling with surrounding erythema. They took the dressing off, offloaded it, let it dry out, and cleaned it up with alcohol pads. Over the next couple of days ulcer improved, surrounding erythema and foul odor resolved, and patient started eating better. Still, there is an eschar present and ulcer that the family isnt sure how to take care of, so they brought her in to the ED today.   Consultants:  None  Procedures:  None  Antibiotics:  Vancomycin Zosyn  HPI/Subjective: Nonverbal because of a marked dementia  Objective: Filed Vitals:   11/24/13 0507 11/24/13 1400 11/24/13 2251  11/25/13 0635  BP: 156/88 156/72 142/69 148/73  Pulse: 71 66 67 58  Temp: 97.4 F (36.3 C) 97.6 F (36.4 C) 98.4 F (36.9 C) 97.4 F (36.3 C)  TempSrc: Axillary Axillary Axillary Axillary  Resp: 20 16 16 15   Height: 5\' 7"  (1.702 m)     Weight: 69.264 kg (152 lb 11.2 oz)     SpO2: 100% 99% 98% 100%    Intake/Output Summary (Last 24 hours) at 11/25/13 1411 Last data filed at 11/25/13 0531  Gross per 24 hour  Intake   1763 ml  Output      0 ml  Net   1763 ml    Exam:  General:disoriented Cardiovascular: RRR, nl S1 s2  Respiratory: Decreased breath sounds at the bases, scattered rhonchi, no crackles  Abdomen: soft +BS NT/ND, no masses palpable  Extremities: No cyanosis and no edema      Data Reviewed: Basic Metabolic Panel:  Recent Labs Lab 11/23/13 2230 11/24/13 1019 11/25/13 0547  NA 144 148* 148*  K 4.5 3.7 3.5*  CL 115* 113* 112  CO2  --  24 24  GLUCOSE 390* 122* 97  BUN 31* 20 15  CREATININE 1.60* 0.96 0.91  CALCIUM  --  8.9 8.9    Liver Function Tests:  Recent Labs Lab 11/24/13 1019  AST 13  ALT 9  ALKPHOS 61  BILITOT 0.4  PROT 7.1  ALBUMIN 3.2*   No results found for this basename: LIPASE, AMYLASE,  in the last 168 hours No results found for this basename: AMMONIA,  in the last 168 hours  CBC:  Recent Labs Lab 11/23/13 2220 11/23/13 2230 11/24/13 1019  WBC 7.1  --  6.5  NEUTROABS 4.3  --   --   HGB 12.1 13.6 11.0*  HCT 38.3 40.0 34.5*  MCV 86.7  --  86.5  PLT 218  --  192    Cardiac Enzymes: No results found for this basename: CKTOTAL, CKMB, CKMBINDEX, TROPONINI,  in the last 168 hours BNP (last 3 results) No results found for this basename: PROBNP,  in the last 8760 hours   CBG:  Recent Labs Lab 11/24/13 1337 11/24/13 1720 11/24/13 2254 11/25/13 0752 11/25/13 1150  GLUCAP 144* 160* 109* 70 98    No results found for this or any previous visit (from the past 240 hour(s)).   Studies: Ct Foot Right W  Contrast  11/24/2013   CLINICAL DATA:  Right heel ulceration. Question osteomyelitis. 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  EXAM: CT OF THE RIGHT FOOT WITH CONTRAST  TECHNIQUE: Multidetector CT imaging was performed following the standard protocol during bolus administration of intravenous contrast.  CONTRAST:  78mL OMNIPAQUE IOHEXOL 300 MG/ML  SOLN  COMPARISON:  Foot radiographs 11/24/2013.  FINDINGS: There is deep soft tissue ulceration over the posterior aspect of the calcaneal tuberosity. There is increased density within the underlying subcutaneous fat, although no evidence of focal fluid collection or foreign body. There is no adjacent destruction of the calcaneus.  There appears to be some skin and subcutaneous thickening medial to the first metatarsal phalangeal joint. No skin ulceration or focal fluid collection is apparent in this area. There are no generalized inflammatory changes.  Scattered vascular calcifications are present throughout the foot. There are no significant joint effusions.  Moderate degenerative changes are present at the first metatarsal phalangeal joint. No other significant arthropathic changes are observed. The bones are diffusely demineralized. There is no evidence acute fracture.  IMPRESSION: 1. Deep soft tissue ulceration over the heel. No evidence of soft tissue abscess. 2. No evidence of osteomyelitis. 3. Nonspecific skin and subcutaneous thickening medial to the first metatarsal phalangeal joint. There are underlying arthropathic changes at that joint, likely osteoarthritis.   Electronically Signed   By: Camie Patience M.D.   On: 11/24/2013 16:17   Dg Foot Complete Right  11/24/2013   CLINICAL DATA:  Wound infection  EXAM: RIGHT FOOT COMPLETE - 3+ VIEW  COMPARISON:  None.  FINDINGS: No acute fracture dislocation. Joint spaces are normally aligned. Scattered degenerative osteoarthritic changes present throughout the foot.  Soft tissue ulceration present at the posterior  heel. No radiopaque foreign body. No dissecting soft tissue emphysema. No osseous erosion to suggest active osteomyelitis.  Vascular calcifications noted within the foot.  IMPRESSION: 1. Soft tissue ulceration at the posterior heel. No evidence of active osteomyelitis or dissecting soft tissue emphysema. 2. No acute fracture or dislocation. 3. Diffuse osteopenia.   Electronically Signed   By: Jeannine Boga M.D.   On: 11/24/2013 02:05    Scheduled Meds: . collagenase   Topical Daily  . feeding supplement (PRO-STAT SUGAR FREE 64)  30 mL Per Tube TID WC  . insulin aspart  0-9 Units Subcutaneous TID WC  . insulin aspart  6 Units Intravenous Once  . insulin detemir  20 Units Subcutaneous Q2200  . Memantine HCl ER  28 mg Oral Daily  . multivitamin with minerals  1 tablet Oral Daily  . piperacillin-tazobactam (ZOSYN)  IV  3.375 g Intravenous 3 times per day  . pravastatin  40 mg Oral  Daily  . vancomycin  1,250 mg Intravenous Q24H  . warfarin  4 mg Oral q1800  . Warfarin - Pharmacist Dosing Inpatient   Does not apply q1800   Continuous Infusions:   Active Problems:   Heel ulcer   AKI (acute kidney injury)    Time spent: 40 minutes   [facility] Pager [phone]. If 7PM-7AM, please contact night-coverage at [email or web address], password [facility] 11/25/2013, 2:11 PM  LOS: 1 day

## 2013-11-26 LAB — GLUCOSE, CAPILLARY
GLUCOSE-CAPILLARY: 57 mg/dL — AB (ref 70–99)
GLUCOSE-CAPILLARY: 154 mg/dL — AB (ref 70–99)
GLUCOSE-CAPILLARY: 220 mg/dL — AB (ref 70–99)
Glucose-Capillary: 139 mg/dL — ABNORMAL HIGH (ref 70–99)

## 2013-11-26 LAB — PROTIME-INR
INR: 2.75 — ABNORMAL HIGH (ref 0.00–1.49)
Prothrombin Time: 29.1 seconds — ABNORMAL HIGH (ref 11.6–15.2)

## 2013-11-26 MED ORDER — INSULIN DETEMIR 100 UNIT/ML ~~LOC~~ SOLN
15.0000 [IU] | Freq: Every day | SUBCUTANEOUS | Status: DC
  Administered 2013-11-26: 15 [IU] via SUBCUTANEOUS
  Filled 2013-11-26 (×2): qty 0.15

## 2013-11-26 MED ORDER — DOXYCYCLINE HYCLATE 100 MG PO TABS
100.0000 mg | ORAL_TABLET | Freq: Two times a day (BID) | ORAL | Status: DC
  Administered 2013-11-26 – 2013-11-27 (×3): 100 mg via ORAL
  Filled 2013-11-26 (×4): qty 1

## 2013-11-26 MED ORDER — INSULIN DETEMIR 100 UNIT/ML ~~LOC~~ SOLN
18.0000 [IU] | Freq: Every day | SUBCUTANEOUS | Status: DC
  Filled 2013-11-26: qty 0.18

## 2013-11-26 MED ORDER — AMOXICILLIN-POT CLAVULANATE 875-125 MG PO TABS
1.0000 | ORAL_TABLET | Freq: Two times a day (BID) | ORAL | Status: DC
  Administered 2013-11-26 – 2013-11-27 (×3): 1 via ORAL
  Filled 2013-11-26 (×4): qty 1

## 2013-11-26 NOTE — Progress Notes (Signed)
ANTICOAGULATION CONSULT NOTE - Follow-up Consult  Pharmacy Consult for warfarin Indication: hx splenic infarct  Allergies  Allergen Reactions  . Ace Inhibitors     REACTION: cough    Patient Measurements: Height: 5\' 7"  (170.2 cm) Weight: 152 lb 11.2 oz (69.264 kg) IBW/kg (Calculated) : 61.6  Vital Signs: Temp: 98.4 F (36.9 C) (08/25 0640) Temp src: Axillary (08/25 0640) BP: 141/79 mmHg (08/25 0640) Pulse Rate: 68 (08/25 0640)  Labs:  Recent Labs  11/23/13 2220 11/23/13 2230 11/24/13 0623 11/24/13 1019 11/25/13 0547 11/26/13 0700  HGB 12.1 13.6  --  11.0*  --   --   HCT 38.3 40.0  --  34.5*  --   --   PLT 218  --   --  192  --   --   LABPROT  --   --  30.6*  --  28.5* 29.1*  INR  --   --  2.93*  --  2.68* 2.75*  CREATININE  --  1.60*  --  0.96 0.91  --     Estimated Creatinine Clearance: 53.5 ml/min (by C-G formula based on Cr of 0.91).  Assessment: 51 yof on chronic coumadin for hx splenic infarct. INR remains therapeutic on home dose. No bleeding noted.   Goal of Therapy:  INR 2-3   Plan:  1. Continue home dose of Warfarin 4mg  po daily 2. Daily INR  Sherlon Handing, PharmD, BCPS Clinical pharmacist, pager [phone] 11/26/2013,9:31 AM

## 2013-11-26 NOTE — Progress Notes (Addendum)
Inpatient Diabetes Program Recommendations  AACE/ADA: New Consensus Statement on Inpatient Glycemic Control (2013)  Target Ranges:  Prepandial:   less than 140 mg/dL      Peak postprandial:   less than 180 mg/dL (1-2 hours)      Critically ill patients:  140 - 180 mg/dL     Results for NESHIA, MCKENZIE (MRN [id number]) as of 11/26/2013 12:41  Ref. Range 11/25/2013 07:52 11/25/2013 11:50 11/25/2013 17:47 11/25/2013 21:41  Glucose-Capillary Latest Range: 70-99 mg/dL 70 98 109 (H) 118 (H)    Results for WILLO, YOON (MRN [id number]) as of 11/26/2013 12:41  Ref. Range 11/26/2013 07:54 11/26/2013 12:06  Glucose-Capillary Latest Range: 70-99 mg/dL 57 (L) 139 (H)     Hypoglycemic this morning after receiving Levemir 20 units QHS last night.  Note plans to d/c patient to SNF tomorrow.    MD-If patient not discharged to SNF tomorrow,please consider decreasing Levemir to 15 units QHS     Will follow Wyn Quaker RN, MSN, CDE Diabetes Coordinator Inpatient Diabetes Program Team Pager: [phone] (8a-10p)

## 2013-11-26 NOTE — Progress Notes (Signed)
Dr. Allyson Sabal requesting FL2. Barnett Applebaum, Index notified.

## 2013-11-26 NOTE — Progress Notes (Signed)
[facility] PROGRESS NOTE  [name] [facility]:[id number] DOB: 10/12/1940 DOA: 11/24/2013 PCP: Unice Cobble, MD  Assessment/Plan: Active Problems:   Heel ulcer   AKI (acute kidney injury)    Probable osteomyelitis/cellulitis/UTI  Unstageable Pressure Ulcer (PrU), post debridement, Stage III  Started the patient on vancomycin and Zosyn  Now transitioning to oral antibiotics and the patient is clinically improving Start the patient on Augmentin and doxycycline for 10 more days CT scan of the right foot shows Deep soft tissue ulceration over the heel. No evidence of soft  tissue abscess.  no evidence of osteomyelitis.  Status post wound debridement by wound care consult    Diabetes mellitus type 2  Continue outpatient medications and sliding scale insulin   Decrease Levemir to 15 units each bedtime because of low CBG this morning  Alzheimer's disease  - ends stage  Essential hypertension, benign  - BP controlled  Paroxysmal atrial fibrillation/on anticoagulant  INR per pharmacy protocol  Paroxysmal atrial fibrillation  - currently NSR    Code Status: full  Family Communication: family updated about patient's clinical progress  Disposition Plan: Anticipate discharge to SNF tomorrow if CBG. stable overnight  Brief narrative:  [name] is a 73 y.o. female who was recently taken home from her SNF by family due to the fact that insurance wouldn't continue to pay for SNF. 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 After family took her home last weekend they noted she had a right heel ulcer, foul smelling with surrounding erythema. They took the dressing off, offloaded it, let it dry out, and cleaned it up with alcohol pads. Over the next couple of days ulcer improved, surrounding erythema and foul odor resolved, and patient started eating better.  Patient started on broad-spectrum IV antibiotics , remained afebrile with normal white count IV antibiotics  are being discontinued and the patient is being transitioned to oral antibiotics Discharge held because of hypoglycemia this morning  Consultants:  None Procedures:  None Antibiotics:  Vancomycin Zosyn HPI/Subjective:  Nonverbal because of a marked dementia   Objective: Filed Vitals:   11/25/13 0635 11/25/13 1502 11/25/13 2205 11/26/13 0640  BP: 148/73 136/73 164/92 141/79  Pulse: 58 61 65 68  Temp: 97.4 F (36.3 C) 97.7 F (36.5 C) 98.1 F (36.7 C) 98.4 F (36.9 C)  TempSrc: Axillary Oral Oral Axillary  Resp: 15 17 16 15   Height:      Weight:      SpO2: 100% 100% 97% 98%    Intake/Output Summary (Last 24 hours) at 11/26/13 1512 Last data filed at 11/25/13 2148  Gross per 24 hour  Intake    670 ml  Output      0 ml  Net    670 ml    Exam:  General: alert & oriented to self Cardiovascular: RRR, nl S1 s2  Respiratory: Decreased breath sounds at the bases, scattered rhonchi, no crackles  Abdomen: soft +BS NT/ND, no masses palpable  Extremities: No cyanosis and no edema      Data Reviewed: Basic Metabolic Panel:  Recent Labs Lab 11/23/13 2230 11/24/13 1019 11/25/13 0547  NA 144 148* 148*  K 4.5 3.7 3.5*  CL 115* 113* 112  CO2  --  24 24  GLUCOSE 390* 122* 97  BUN 31* 20 15  CREATININE 1.60* 0.96 0.91  CALCIUM  --  8.9 8.9    Liver Function Tests:  Recent Labs Lab 11/24/13 1019  AST 13  ALT 9  ALKPHOS 61  BILITOT 0.4  PROT 7.1  ALBUMIN 3.2*   No results found for this basename: LIPASE, AMYLASE,  in the last 168 hours No results found for this basename: AMMONIA,  in the last 168 hours  CBC:  Recent Labs Lab 11/23/13 2220 11/23/13 2230 11/24/13 1019  WBC 7.1  --  6.5  NEUTROABS 4.3  --   --   HGB 12.1 13.6 11.0*  HCT 38.3 40.0 34.5*  MCV 86.7  --  86.5  PLT 218  --  192    Cardiac Enzymes: No results found for this basename: CKTOTAL, CKMB, CKMBINDEX, TROPONINI,  in the last 168 hours BNP (last 3 results) No results found  for this basename: PROBNP,  in the last 8760 hours   CBG:  Recent Labs Lab 11/25/13 1150 11/25/13 1747 11/25/13 2141 11/26/13 0754 11/26/13 1206  GLUCAP 98 109* 118* 57* 139*    No results found for this or any previous visit (from the past 240 hour(s)).   Studies: Ct Foot Right W Contrast  11/24/2013   CLINICAL DATA:  Right heel ulceration. Question osteomyelitis. 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  EXAM: CT OF THE RIGHT FOOT WITH CONTRAST  TECHNIQUE: Multidetector CT imaging was performed following the standard protocol during bolus administration of intravenous contrast.  CONTRAST:  88mL OMNIPAQUE IOHEXOL 300 MG/ML  SOLN  COMPARISON:  Foot radiographs 11/24/2013.  FINDINGS: There is deep soft tissue ulceration over the posterior aspect of the calcaneal tuberosity. There is increased density within the underlying subcutaneous fat, although no evidence of focal fluid collection or foreign body. There is no adjacent destruction of the calcaneus.  There appears to be some skin and subcutaneous thickening medial to the first metatarsal phalangeal joint. No skin ulceration or focal fluid collection is apparent in this area. There are no generalized inflammatory changes.  Scattered vascular calcifications are present throughout the foot. There are no significant joint effusions.  Moderate degenerative changes are present at the first metatarsal phalangeal joint. No other significant arthropathic changes are observed. The bones are diffusely demineralized. There is no evidence acute fracture.  IMPRESSION: 1. Deep soft tissue ulceration over the heel. No evidence of soft tissue abscess. 2. No evidence of osteomyelitis. 3. Nonspecific skin and subcutaneous thickening medial to the first metatarsal phalangeal joint. There are underlying arthropathic changes at that joint, likely osteoarthritis.   Electronically Signed   By: Camie Patience M.D.   On: 11/24/2013 16:17   Dg Foot Complete  Right  11/24/2013   CLINICAL DATA:  Wound infection  EXAM: RIGHT FOOT COMPLETE - 3+ VIEW  COMPARISON:  None.  FINDINGS: No acute fracture dislocation. Joint spaces are normally aligned. Scattered degenerative osteoarthritic changes present throughout the foot.  Soft tissue ulceration present at the posterior heel. No radiopaque foreign body. No dissecting soft tissue emphysema. No osseous erosion to suggest active osteomyelitis.  Vascular calcifications noted within the foot.  IMPRESSION: 1. Soft tissue ulceration at the posterior heel. No evidence of active osteomyelitis or dissecting soft tissue emphysema. 2. No acute fracture or dislocation. 3. Diffuse osteopenia.   Electronically Signed   By: Jeannine Boga M.D.   On: 11/24/2013 02:05    Scheduled Meds: . collagenase   Topical Daily  . feeding supplement (PRO-STAT SUGAR FREE 64)  30 mL Per Tube TID WC  . insulin aspart  0-9 Units Subcutaneous TID WC  . insulin aspart  6 Units Intravenous Once  . insulin detemir  20 Units Subcutaneous Q2200  . Memantine HCl ER  28 mg Oral Daily  . multivitamin with minerals  1 tablet Oral Daily  . piperacillin-tazobactam (ZOSYN)  IV  3.375 g Intravenous 3 times per day  . pravastatin  40 mg Oral Daily  . vancomycin  1,250 mg Intravenous Q24H  . warfarin  4 mg Oral q1800  . Warfarin - Pharmacist Dosing Inpatient   Does not apply q1800   Continuous Infusions:   Active Problems:   Heel ulcer   AKI (acute kidney injury)    Time spent: 40 minutes   [facility] Pager [phone]. If 7PM-7AM, please contact night-coverage at [email or web address], password [facility] 11/26/2013, 3:12 PM  LOS: 2 days

## 2013-11-26 NOTE — Evaluation (Signed)
Occupational Therapy Evaluation Patient Details Name: [name] MRN: [id number] DOB: 1941-02-08 Today's Date: 11/26/2013    History of Present Illness [name] is a 73 y.o. female who was recently taken home from her SNF by family due to the fact that insurance wouldn't continue to pay for SNF.  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  After family took her home last weekend they noted she had a right heel ulcer, foul smelling with surrounding erythema.  They took the dressing off, offloaded it, let it dry out, and cleaned it up with alcohol pads.  Over the next couple of days ulcer improved, surrounding erythema and foul odor resolved, and patient started eating better.  Still, there is an eschar present and ulcer that the family isnt sure how to take care of, so they brought her in to the ED today.   Clinical Impression   Pt will requires total +2 (A) for all transfers and 24/7 (A) for d/c home. If family is unable to provided please SW address SNF level care. Pt currently in SNF appropriate. Ot to follow acutely to help with activity tolerance and basic transfers for ADLS. Pt demonstrates self feeding once in chair with cueing. RN and tech notified pt in chair and self feeding.     Follow Up Recommendations  SNF;Supervision/Assistance - 24 hour    Equipment Recommendations  Other (comment)    Recommendations for Other Services       Precautions / Restrictions Precautions Precautions: Fall Precaution Comments: Rt heel ulcer Restrictions Weight Bearing Restrictions: No      Mobility Bed Mobility Overal bed mobility: +2 for physical assistance;Needs Assistance Bed Mobility: Supine to Sit     Supine to sit: Total assist;+2 for physical assistance     General bed mobility comments: pt once EOB static sitting min guard (A)  Transfers Overall transfer level: Needs assistance   Transfers: Sit to/from Stand;Stand Pivot Transfers Sit to Stand: +2  physical assistance;Max assist Stand pivot transfers: +2 physical assistance;Mod assist       General transfer comment: Pt initially attempting with RW and very unsuccessful attempt. Pt with rail removed and chair arm dropped. Ot pointing to chair with pt visually attending. pt initiated sit<>Stand and side stepped to chair. Pt requires total +2 total (A) when not engaged in functional task    Balance Overall balance assessment: Needs assistance Sitting-balance support: Bilateral upper extremity supported;Feet supported Sitting balance-Leahy Scale: Fair     Standing balance support: Bilateral upper extremity supported;During functional activity Standing balance-Leahy Scale: Poor                              ADL Overall ADL's : Needs assistance/impaired Eating/Feeding: Minimal assistance;Sitting Eating/Feeding Details (indicate cue type and reason): Pt sitting in chair with therapist hand over hand initiating eating. pt able to complete task with some demo of undershooting. pt using a spoon to help scoop up all food.  Grooming: Dance movement psychotherapist;Total assistance                   Toilet Transfer: +2 for physical assistance;Moderate assistance;Stand-pivot             General ADL Comments: Pt supine with generalized response to all attempts to arouse. Pt wiggle toes. Pt with lights turned on, calling name, tactile input, socks placed on feet etc. Pt required total (A) supine<> sit EOB to incr arousal.  Vision                     Perception     Praxis      Pertinent Vitals/Pain Pain Assessment: No/denies pain     Hand Dominance Right   Extremity/Trunk Assessment Upper Extremity Assessment Upper Extremity Assessment: Generalized weakness   Lower Extremity Assessment Lower Extremity Assessment: Defer to PT evaluation   Cervical / Trunk Assessment Cervical / Trunk Assessment: Kyphotic   Communication Communication Communication: Receptive  difficulties;Expressive difficulties   Cognition Arousal/Alertness: Lethargic Behavior During Therapy: Flat affect Overall Cognitive Status: History of cognitive impairments - at baseline                     General Comments       Exercises       Shoulder Instructions      Home Living Family/patient expects to be discharged to:: Private residence Living Arrangements: Spouse/significant other;Children Available Help at Discharge: Family Type of Home: House                           Additional Comments: no family present, pt with cognitive deficits. Uncertain of family ability to A      Prior Functioning/Environment Level of Independence: Needs assistance  Gait / Transfers Assistance Needed: pt was walking and moving on her own with supervision ADL's / Homemaking Assistance Needed: niece has been assisting with ADLs for grossly a week prior to admission   Comments: Unknown due to cognition- information obtained from PT evaluation    OT Diagnosis: Generalized weakness;Cognitive deficits   OT Problem List: Decreased strength;Decreased activity tolerance;Impaired balance (sitting and/or standing);Decreased cognition;Decreased safety awareness;Decreased knowledge of precautions;Decreased knowledge of use of DME or AE;Pain   OT Treatment/Interventions: Self-care/ADL training;Therapeutic exercise;DME and/or AE instruction;Therapeutic activities;Cognitive remediation/compensation;Patient/family education;Balance training    OT Goals(Current goals can be found in the care plan section) Acute Rehab OT Goals Patient Stated Goal: unable to state OT Goal Formulation: Patient unable to participate in goal setting Time For Goal Achievement: 12/10/13 Potential to Achieve Goals: Good  OT Frequency: Min 2X/week   Barriers to D/C:    to speak with family regarding d/c planning and (A) level       Co-evaluation              End of Session Equipment Utilized  During Treatment: Gait belt Nurse Communication: Mobility status;Precautions  Activity Tolerance: Patient tolerated treatment well Patient left: in chair;with call bell/phone within reach;with chair alarm set   Of note: pt was incontinent of bladder on arrival. Question if baseline wears diapers  Time: 1020-1041 OT Time Calculation (min): 21 min Charges:  OT General Charges $OT Visit: 1 Procedure OT Evaluation $Initial OT Evaluation Tier I: 1 Procedure OT Treatments $Self Care/Home Management : 8-22 mins G-Codes:    Peri Maris 12-15-13, 12:04 PM Pager: [phone]

## 2013-11-27 DIAGNOSIS — E785 Hyperlipidemia, unspecified: Secondary | ICD-10-CM

## 2013-11-27 DIAGNOSIS — D649 Anemia, unspecified: Secondary | ICD-10-CM

## 2013-11-27 DIAGNOSIS — N390 Urinary tract infection, site not specified: Secondary | ICD-10-CM

## 2013-11-27 DIAGNOSIS — Z7901 Long term (current) use of anticoagulants: Secondary | ICD-10-CM

## 2013-11-27 DIAGNOSIS — E1149 Type 2 diabetes mellitus with other diabetic neurological complication: Secondary | ICD-10-CM

## 2013-11-27 DIAGNOSIS — I10 Essential (primary) hypertension: Secondary | ICD-10-CM

## 2013-11-27 DIAGNOSIS — I4891 Unspecified atrial fibrillation: Secondary | ICD-10-CM

## 2013-11-27 LAB — GLUCOSE, CAPILLARY
GLUCOSE-CAPILLARY: 121 mg/dL — AB (ref 70–99)
GLUCOSE-CAPILLARY: 150 mg/dL — AB (ref 70–99)
GLUCOSE-CAPILLARY: 205 mg/dL — AB (ref 70–99)

## 2013-11-27 LAB — COMPREHENSIVE METABOLIC PANEL
ALBUMIN: 2.9 g/dL — AB (ref 3.5–5.2)
ALT: 9 U/L (ref 0–35)
AST: 14 U/L (ref 0–37)
Alkaline Phosphatase: 54 U/L (ref 39–117)
Anion gap: 11 (ref 5–15)
BUN: 17 mg/dL (ref 6–23)
CALCIUM: 8.6 mg/dL (ref 8.4–10.5)
CO2: 26 mEq/L (ref 19–32)
CREATININE: 0.81 mg/dL (ref 0.50–1.10)
Chloride: 105 mEq/L (ref 96–112)
GFR calc Af Amer: 82 mL/min — ABNORMAL LOW (ref >90)
GFR calc non Af Amer: 70 mL/min — ABNORMAL LOW (ref >90)
Glucose, Bld: 128 mg/dL — ABNORMAL HIGH (ref 70–99)
Potassium: 3.3 mEq/L — ABNORMAL LOW (ref 3.7–5.3)
Sodium: 142 mEq/L (ref 137–147)
TOTAL PROTEIN: 6.6 g/dL (ref 6.0–8.3)
Total Bilirubin: 0.3 mg/dL (ref 0.3–1.2)

## 2013-11-27 LAB — CBC
HCT: 34.4 % — ABNORMAL LOW (ref 36.0–46.0)
Hemoglobin: 10.7 g/dL — ABNORMAL LOW (ref 12.0–15.0)
MCH: 26.8 pg (ref 26.0–34.0)
MCHC: 31.1 g/dL (ref 30.0–36.0)
MCV: 86 fL (ref 78.0–100.0)
PLATELETS: 183 10*3/uL (ref 150–400)
RBC: 4 MIL/uL (ref 3.87–5.11)
RDW: 13.1 % (ref 11.5–15.5)
WBC: 4.8 10*3/uL (ref 4.0–10.5)

## 2013-11-27 LAB — PROTIME-INR
INR: 3.04 — ABNORMAL HIGH (ref 0.00–1.49)
Prothrombin Time: 31.5 seconds — ABNORMAL HIGH (ref 11.6–15.2)

## 2013-11-27 MED ORDER — AMOXICILLIN-POT CLAVULANATE 875-125 MG PO TABS: 1.0000 | ORAL_TABLET | Freq: Two times a day (BID) | ORAL | Status: DC

## 2013-11-27 MED ORDER — COLLAGENASE 250 UNIT/GM EX OINT: TOPICAL_OINTMENT | Freq: Every day | CUTANEOUS | Status: DC

## 2013-11-27 MED ORDER — ADULT MULTIVITAMIN W/MINERALS CH: 1.0000 | ORAL_TABLET | Freq: Every day | ORAL | Status: DC

## 2013-11-27 MED ORDER — INSULIN DETEMIR 100 UNIT/ML FLEXPEN: 15.0000 [IU] | PEN_INJECTOR | Freq: Every evening | SUBCUTANEOUS | Status: DC

## 2013-11-27 MED ORDER — PRO-STAT SUGAR FREE PO LIQD: 30.0000 mL | Freq: Three times a day (TID) | ORAL | Status: DC

## 2013-11-27 MED ORDER — INSULIN ASPART 100 UNIT/ML ~~LOC~~ SOLN: 6.0000 [IU] | Freq: Once | SUBCUTANEOUS | Status: DC

## 2013-11-27 MED ORDER — DOXYCYCLINE HYCLATE 100 MG PO TABS: 100.0000 mg | ORAL_TABLET | Freq: Two times a day (BID) | ORAL | Status: DC

## 2013-11-27 NOTE — Discharge Instructions (Signed)
Pressure Ulcer A pressure ulcer is a sore that has formed from the breakdown of skin and exposure of deeper layers of tissue. It develops in areas of the body where there is unrelieved pressure. Pressure ulcers are usually found over a bony area, such as the shoulder blades, spine, lower back, hips, knees, ankles, and heels. Pressure ulcers vary in severity. Your health care provider may determine the severity (stage) of your pressure ulcer. The stages include:  Stage I--The skin is red, and when the skin is pressed, it stays red.  Stage II--The top layer of skin is gone, and there is a shallow, pink ulcer.  Stage III--The ulcer becomes deeper, and it is more difficult to see the whole wound. Also, there may be yellow or brown parts, as well as pink and red parts.  Stage IV--The ulcer may be deep and red, pink, brown, white, or yellow. Bone or muscle may be seen.  Unstageable pressure ulcer--The ulcer is covered almost completely with black, brown, or yellow tissue. It is not known how deep the ulcer is or what stage it is until this covering comes off.  Suspected deep tissue injury--A person's skin can be injured from pressure or pulling on the skin when his or her position is changed. The skin appears purple or maroon. There may not be an opening in the skin, but there could be a blood-filled blister. This deep tissue injury is often difficult to see in people with darker skin tones. The site may open and become deeper in time. However, early interventions will help the area heal and may prevent the area from opening. CAUSES  Pressure ulcers are caused by pressure against the skin that limits the flow of blood to the skin and nearby tissues. There are many risk factors that can lead to pressure sores. RISK FACTORS  Decreased ability to move.  Decreased ability to feel pain or discomfort.  Excessive skin moisture from urine, stool, sweat, or secretions.  Poor  nutrition.  Dehydration.  Tobacco, drug, or alcohol abuse.  Having someone pull on bedsheets that are under you, such as when health care workers are changing your position in a hospital bed.  Obesity.  Increased adult age.  Hospitalization in a critical care unit for longer than 4 days with use of medical devices.  Prolonged use of medical devices.  Critical illness.  Anemia.  Traumatic brain injury.  Spinal cord injury.  Stroke.  Diabetes.  Poor blood glucose control.  Low blood pressure (hypotension).  Low oxygen levels.  Medicines that reduce blood flow.  Infection. DIAGNOSIS  Your health care provider will diagnose your pressure ulcer based on its appearance. The health care provider may determine the stage of your pressure ulcer as well. Tests may be done to check for infection, to assess your circulation, or to check for other diseases, such as diabetes. TREATMENT  Treatment of your pressure ulcer begins with determining what stage the ulcer is in. Your treatment team may include your health care provider, a wound care specialist, a nutritionist, a physical therapist, and a surgeon. Possible treatments may include:   Moving or repositioning every 1-2 hours.  Using beds or mattresses to shift your body weight and pressure points frequently.  Improving your diet.  Cleaning and bandaging (dressing) the open wound.  Giving antibiotic medicines.  Removing damaged tissue.  Surgery and sometimes skin grafts. HOME CARE INSTRUCTIONS  If you were hospitalized, follow the care plan that was started in the hospital.    Avoid staying in the same position for more than 2 hours. Use padding, devices, or mattresses to cushion your pressure points as directed by your health care provider.  Eat a well-balanced diet. Take nutritional supplements and vitamins as directed by your health care provider.  Keep all follow-up appointments.  Only take over-the-counter or  prescription medicines for pain, fever, or discomfort as directed by your health care provider. SEEK MEDICAL CARE IF:   Your pressure ulcer is not improving.  You do not know how to care for your pressure ulcer.  You notice other areas of redness on your skin.  You have a fever. SEEK IMMEDIATE MEDICAL CARE IF:   You have increasing redness, swelling, or pain in your pressure ulcer.  You notice pus coming from your pressure ulcer.  You notice a bad smell coming from the wound or dressing.  Your pressure ulcer opens up again. Document Released: 03/21/2005 Document Revised: 03/26/2013 Document Reviewed: 11/26/2012 Exit[employer] Patient Information 2015 [employer]. This information is not intended to replace advice given to you by your health care provider. Make sure you discuss any questions you have with your health care provider.  

## 2013-11-27 NOTE — Progress Notes (Signed)
Son called unit and said he would be here to pick up the pt when he gets off of work today.  Charge nurse made aware.  Will continue to monitor. Syliva Overman

## 2013-11-27 NOTE — Progress Notes (Signed)
Pt's husband is refusing to allow pt to be picked up by ambulance and transported home d/t cost.  I called the social worker who explained that [facility] would bill the pt's insurance and not ask for money upfront.  Pt's husband is still refusing ambulance ride.  Pt's husband explained that his son, Ronalee Belts, should be coming to see them soon.  I attempted to call son and did not get an answer.  I left a voicemail.  The pt also said his son's girlfriend, Lorna Few, may be coming to see them after she gets out of school.  I attempted to call Lorna Few but could not get an answer or leave a voicemail. Syliva Overman

## 2013-11-27 NOTE — Progress Notes (Signed)
Called son regarding transportation home and left a voicemail.  Will page MD to make aware of pt still here and pass on to night RN. Syliva Overman

## 2013-11-27 NOTE — Progress Notes (Signed)
Charge nurse, Joaquim Lai spoke with pt's husband regarding safety of transport and pt's husband is still refusing ambulance transportation. Syliva Overman

## 2013-11-27 NOTE — Discharge Summary (Addendum)
Physician Discharge Summary  [name] ATF:[id number] DOB: March 20, 1941 DOA: 11/24/2013  PCP: Unice Cobble, MD  Admit date: 11/24/2013 Discharge date: 11/27/2013  Time spent: 45 minutes  Recommendations for Outpatient Follow-up:  Patient will be discharged home with health services including RN, PT, social worker and nurses aide. Patient is to followup with her primary care physician within one week of discharge. Patient to continue taking her medications as prescribed. Patient should continue a soft bland diet, carb modified. Patient's husband refused nursing home placement.  Discharge Diagnoses:  Cellulitis of the heel Urinary tract infection Diabetes mellitus type 2 Alzheimer's disease Essential hypertension Paroxysmal atrial fibrillation on anticoagulation  Discharge Condition: Stable  Diet recommendation: Soft/bland, carb modified  Filed Weights   11/24/13 0507  Weight: 69.264 kg (152 lb 11.2 oz)    History of present illness:  [name] is a 73 y.o. female who was recently taken home from her SNF by family due to the fact that insurance wouldn't continue to pay for SNF. 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 After family took her home last weekend they noted she had a right heel ulcer, foul smelling with surrounding erythema. They took the dressing off, offloaded it, let it dry out, and cleaned it up with alcohol pads. Over the next couple of days ulcer improved, surrounding erythema and foul odor resolved, and patient started eating better. Still, there was an eschar present and ulcer that the family wasn't sure how to take care of, so they brought her in to the ED.  Hospital Course:  Cellulitis/urinary tract infection -Patient appears to have a pressure ulcer of the heel, underwent debridement, stage III -Started on vancomycin and Zosyn however switched to Augmentin and doxycycline and will need to continue this for 9 more days.  -There was  questionable probable osteomyelitis however CT scan of the right foot showed deep soft tissue ulceration over the heel, no evidence of soft tissue abscess or osteomyelitis -wound care was consulted -Physical therapy as well as occupational therapy did see the patient and recommended skilled nursing facility however her husband refused -Patient will be discharged with home health  Diabetes mellitus type 2 -Continue Lantus, 15 units daily, 6 units per meal of NovoLog, insulin sliding scale.  Alzheimer's disease -Appears to be end-stage, patient is noncommunicative -Continue memantine  Essential hypertension -Stable, currently on no medications  Paroxysmal atrial fibrillation on anticoagulation -Appears to be in sinus rhythm -Continue Coumadin, INR therapeutic at 3.04  Hyperlipidemia -Continue statin  Procedures: None  Consultations: None  Discharge Exam: Filed Vitals:   11/27/13 0626  BP: 152/76  Pulse: 57  Temp: 98 F (36.7 C)  Resp: 16     General: Well developed, well nourished, NAD, appears stated age  HEENT: NCAT, mucous membranes moist.  Cardiovascular: S1 S2 auscultated, no rubs, murmurs or gallops. Regular rate and rhythm.  Respiratory: Clear to auscultation bilaterally with equal chest rise  Abdomen: Soft, nontender, nondistended, + bowel sounds  Extremities: warm dry without cyanosis clubbing or edema, heels currently wrapped  Discharge Instructions      Discharge Instructions   Discharge instructions    Complete by:  As directed   Patient will be discharged home with health services including RN, PT, social worker and nurses aide. Patient is to followup with her primary care physician within one week of discharge. Patient to continue taking her medications as prescribed. Patient should continue a soft bland diet, carb modified.  Medication List         amoxicillin-clavulanate 875-125 MG per tablet  Commonly known as:  AUGMENTIN    Take 1 tablet by mouth every 12 (twelve) hours.     collagenase ointment  Commonly known as:  SANTYL  Apply topically daily.     doxycycline 100 MG tablet  Commonly known as:  VIBRA-TABS  Take 1 tablet (100 mg total) by mouth every 12 (twelve) hours.     feeding supplement (PRO-STAT SUGAR FREE 64) Liqd  Place 30 mLs into feeding tube 3 (three) times daily with meals.     Insulin Detemir 100 UNIT/ML Pen  Commonly known as:  LEVEMIR FLEXPEN  Inject 15 Units into the skin every evening.     multivitamin with minerals Tabs tablet  Take 1 tablet by mouth daily.     NAMENDA XR 28 MG Cp24  Generic drug:  Memantine HCl ER  Take 28 mg by mouth daily.     NOVOLOG FLEXPEN 100 UNIT/ML FlexPen  Generic drug:  insulin aspart  - Inject 2-10 Units into the skin every morning. 200-250=2 units  - 251-300=4 units  - 301-350=6 units  - 351-400=8 units  - 401-500=10 units  - Over 400 call MD     insulin aspart 100 UNIT/ML injection  Commonly known as:  novoLOG  Inject 6 Units into the vein once.     potassium chloride SA 20 MEQ tablet  Commonly known as:  K-DUR,KLOR-CON  Take 20 mEq by mouth 2 (two) times daily.     pravastatin 40 MG tablet  Commonly known as:  PRAVACHOL  Take 40 mg by mouth daily.     warfarin 4 MG tablet  Commonly known as:  COUMADIN  Take 4 mg by mouth daily.       Allergies  Allergen Reactions  . Ace Inhibitors     REACTION: cough   Follow-up Information   Follow up with Unice Cobble, MD. Schedule an appointment as soon as possible for a visit in 1 week. [facility] followup)    Specialty:  Internal Medicine   Contact information:   [address] [address] [phone]        The results of significant diagnostics from this hospitalization (including imaging, microbiology, ancillary and laboratory) are listed below for reference.    Significant Diagnostic Studies: Ct Foot Right W Contrast  11/24/2013   CLINICAL DATA:  Right  heel ulceration. Question osteomyelitis. 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  EXAM: CT OF THE RIGHT FOOT WITH CONTRAST  TECHNIQUE: Multidetector CT imaging was performed following the standard protocol during bolus administration of intravenous contrast.  CONTRAST:  34mL OMNIPAQUE IOHEXOL 300 MG/ML  SOLN  COMPARISON:  Foot radiographs 11/24/2013.  FINDINGS: There is deep soft tissue ulceration over the posterior aspect of the calcaneal tuberosity. There is increased density within the underlying subcutaneous fat, although no evidence of focal fluid collection or foreign body. There is no adjacent destruction of the calcaneus.  There appears to be some skin and subcutaneous thickening medial to the first metatarsal phalangeal joint. No skin ulceration or focal fluid collection is apparent in this area. There are no generalized inflammatory changes.  Scattered vascular calcifications are present throughout the foot. There are no significant joint effusions.  Moderate degenerative changes are present at the first metatarsal phalangeal joint. No other significant arthropathic changes are observed. The bones are diffusely demineralized. There is no evidence acute fracture.  IMPRESSION: 1. Deep soft tissue  ulceration over the heel. No evidence of soft tissue abscess. 2. No evidence of osteomyelitis. 3. Nonspecific skin and subcutaneous thickening medial to the first metatarsal phalangeal joint. There are underlying arthropathic changes at that joint, likely osteoarthritis.   Electronically Signed   By: Camie Patience M.D.   On: 11/24/2013 16:17   Dg Foot Complete Right  11/24/2013   CLINICAL DATA:  Wound infection  EXAM: RIGHT FOOT COMPLETE - 3+ VIEW  COMPARISON:  None.  FINDINGS: No acute fracture dislocation. Joint spaces are normally aligned. Scattered degenerative osteoarthritic changes present throughout the foot.  Soft tissue ulceration present at the posterior heel. No radiopaque foreign body. No dissecting  soft tissue emphysema. No osseous erosion to suggest active osteomyelitis.  Vascular calcifications noted within the foot.  IMPRESSION: 1. Soft tissue ulceration at the posterior heel. No evidence of active osteomyelitis or dissecting soft tissue emphysema. 2. No acute fracture or dislocation. 3. Diffuse osteopenia.   Electronically Signed   By: Jeannine Boga M.D.   On: 11/24/2013 02:05    Microbiology: No results found for this or any previous visit (from the past 240 hour(s)).   Labs: Basic Metabolic Panel:  Recent Labs Lab 11/23/13 2230 11/24/13 1019 11/25/13 0547 11/27/13 0610  NA 144 148* 148* 142  K 4.5 3.7 3.5* 3.3*  CL 115* 113* 112 105  CO2  --  24 24 26   GLUCOSE 390* 122* 97 128*  BUN 31* 20 15 17   CREATININE 1.60* 0.96 0.91 0.81  CALCIUM  --  8.9 8.9 8.6   Liver Function Tests:  Recent Labs Lab 11/24/13 1019 11/27/13 0610  AST 13 14  ALT 9 9  ALKPHOS 61 54  BILITOT 0.4 0.3  PROT 7.1 6.6  ALBUMIN 3.2* 2.9*   No results found for this basename: LIPASE, AMYLASE,  in the last 168 hours No results found for this basename: AMMONIA,  in the last 168 hours CBC:  Recent Labs Lab 11/23/13 2220 11/23/13 2230 11/24/13 1019 11/27/13 0610  WBC 7.1  --  6.5 4.8  NEUTROABS 4.3  --   --   --   HGB 12.1 13.6 11.0* 10.7*  HCT 38.3 40.0 34.5* 34.4*  MCV 86.7  --  86.5 86.0  PLT 218  --  192 183   Cardiac Enzymes: No results found for this basename: CKTOTAL, CKMB, CKMBINDEX, TROPONINI,  in the last 168 hours BNP: BNP (last 3 results) No results found for this basename: PROBNP,  in the last 8760 hours CBG:  Recent Labs Lab 11/26/13 0754 11/26/13 1206 11/26/13 1644 11/26/13 2054 11/27/13 0753  GLUCAP 57* 139* 154* 220* 121*       Signed:  Oreoluwa Gilmer  [facility] 11/27/2013, 11:38 AM

## 2013-11-27 NOTE — Progress Notes (Signed)
PT Cancellation Note  Patient Details Name: [name] MRN: [id number] DOB: 05/02/1940   Cancelled Treatment:     Spoke with nurse; at this time family is refusing SNF and transport. Pt planned for D/C home today. Will recommend HHPT for continued rehab services.Will cont to follow per POC if pt remains in acute setting.    Elie Confer [place], [place] 11/27/2013, 4:38 PM

## 2013-11-28 ENCOUNTER — Telehealth: Payer: Self-pay | Admitting: *Deleted

## 2013-11-28 NOTE — Telephone Encounter (Signed)
Called pt to get set-up for tcm appt. Spoke with husband went over d/c summary.No question from husband concerning d/c summary. Went over pt meds antibiotic, doxycycline, and ointment has not been started yet advise husband to pick meds up today.Husband stated wife has been doing fine since discharge. Made TCM appt 12/03/13 @3 :00...[name]

## 2013-12-02 ENCOUNTER — Telehealth: Payer: Self-pay | Admitting: *Deleted

## 2013-12-02 NOTE — Telephone Encounter (Signed)
OK  Remind the family of her appointment this week No Show for last appt

## 2013-12-02 NOTE — Telephone Encounter (Signed)
Notified Joy with md response...Johny Chess

## 2013-12-02 NOTE — Telephone Encounter (Signed)
Left msg on triage stating requesting verbal order to provide wound care to (R) heel using Santeal & Hydro-Gel daily. Will cover with gauze...[name]

## 2013-12-04 ENCOUNTER — Other Ambulatory Visit (INDEPENDENT_AMBULATORY_CARE_PROVIDER_SITE_OTHER): Payer: Medicare Other

## 2013-12-04 ENCOUNTER — Ambulatory Visit (INDEPENDENT_AMBULATORY_CARE_PROVIDER_SITE_OTHER): Payer: Medicare Other | Admitting: Internal Medicine

## 2013-12-04 ENCOUNTER — Encounter: Payer: Self-pay | Admitting: Internal Medicine

## 2013-12-04 VITALS — BP 110/80 | HR 60 | Temp 98.2°F | Wt 163.2 lb

## 2013-12-04 DIAGNOSIS — L97411 Non-pressure chronic ulcer of right heel and midfoot limited to breakdown of skin: Secondary | ICD-10-CM

## 2013-12-04 DIAGNOSIS — E1159 Type 2 diabetes mellitus with other circulatory complications: Secondary | ICD-10-CM | POA: Insufficient documentation

## 2013-12-04 DIAGNOSIS — E876 Hypokalemia: Secondary | ICD-10-CM

## 2013-12-04 LAB — HEMOGLOBIN A1C: HEMOGLOBIN A1C: 8.9 % — AB (ref 4.6–6.5)

## 2013-12-04 LAB — POTASSIUM: POTASSIUM: 3.9 meq/L (ref 3.5–5.1)

## 2013-12-04 NOTE — Patient Instructions (Signed)
Total protein & albumin reflect nutrition; low fat  or plant protein (Ex soy) sources from [facility]  are recommended as supplements.Dose should not exceed per package label. Low albumin can inhibit healing of ulcer.Glucerna 1 can daily is alternative.   Use a cell phone camera to monitor  the ulcer. Take a photo of the skin lesions every week with a small ruler immediately below the lesion to define any change in size, shape or color.

## 2013-12-04 NOTE — Progress Notes (Signed)
   Subjective:    Patient ID: [name], female    DOB: December 04, 1940, 73 y.o.   MRN: [id number]  HPI    She was hospitalized 8/23-8/26/15 with a right heel ulcer, stage III. This was debrided. Initially she was on vancomycin and Zosyn but switched to Augmentin and doxycycline orally; this should be completed in the next 48 hours  Prior to developing the ulcer she had been in a skilled nursing facility; because of lack of available funds; she was taken home.  At this time she returns home with  nurse, physical therapy, social services, and nurses aide involvement.  She was discharged on soft bland diet.  Hospital records were reviewed; GFR was 71 and potassium 3.3;Albumin  2.9;Creatinine  0.81 and BUN 17. These values would suggest that she only has mild renal impairment at this time despite the uncontrolled diabetes.  Her last A1c on record was 11.6 on 08/08/14. This would correlate to an average sugar of 332 an increased cardiovascular risk of 132%.    Review of Systems  Her only complaint at this time is constipation.  The right heel ulcer is being treated with wet-to-dry dressings once daily  The Aide states that the fasting glucoses range 118-159. By history this is a new glucometer.     Objective:   Physical Exam  Positive or pertinent findings include: She sits in a wheelchair and is totally mute with no verbal interaction. She will not open her mouth for oral exam. According to the caregiver she is wearing only the upper plate There is a dramatic curvature to the thoracic spine. Pedal pulses are decreased The wound is dressed. According to the caregiver it has progressively decreased in size with no evidence of associated purulence.  General appearance :adequately nourished; in no distress. Eyes: No conjunctival inflammation or scleral icterus is present. Heart:  Normal rate and regular rhythm. S1 and S2 normal without gallop, murmur, click, rub or other extra sounds     Lungs:Chest clear to auscultation; no wheezes, rhonchi,rales ,or rubs present.No increased work of breathing.  Abdomen: bowel sounds normal, soft and non-tender without masses, organomegaly or hernias noted.  No guarding or rebound. No flank tenderness to percussion. Skin:Warm & dry.  Intact without suspicious lesions or rashes ; no jaundice or tenting Lymphatic: No lymphadenopathy is noted about the head, neck, axilla             Assessment & Plan:  See Current Assessment & Plan in Problem List under specific Diagnosis

## 2013-12-04 NOTE — Assessment & Plan Note (Signed)
A1c

## 2013-12-04 NOTE — Assessment & Plan Note (Signed)
Continue wet-to-dry dressings once daily.  Use a cell phone camera to monitor  the ulcer. Take a photo of the skin lesions every week  with a small ruler immediately below the lesion to define any change in size, shape or color.

## 2013-12-04 NOTE — Progress Notes (Signed)
Pre visit review using our clinic review tool, if applicable. No additional management support is needed unless otherwise documented below in the visit note. 

## 2013-12-07 ENCOUNTER — Other Ambulatory Visit: Payer: Self-pay | Admitting: Internal Medicine

## 2013-12-07 DIAGNOSIS — E1159 Type 2 diabetes mellitus with other circulatory complications: Secondary | ICD-10-CM

## 2013-12-20 ENCOUNTER — Other Ambulatory Visit: Payer: Self-pay | Admitting: Internal Medicine

## 2013-12-25 ENCOUNTER — Ambulatory Visit (INDEPENDENT_AMBULATORY_CARE_PROVIDER_SITE_OTHER): Payer: Medicare Other | Admitting: *Deleted

## 2013-12-25 DIAGNOSIS — D735 Infarction of spleen: Secondary | ICD-10-CM

## 2013-12-25 DIAGNOSIS — D7389 Other diseases of spleen: Secondary | ICD-10-CM

## 2013-12-25 DIAGNOSIS — Z5181 Encounter for therapeutic drug level monitoring: Secondary | ICD-10-CM

## 2013-12-25 LAB — POCT INR: INR: 1.5

## 2013-12-25 MED ORDER — WARFARIN SODIUM 4 MG PO TABS: 4.0000 mg | ORAL_TABLET | ORAL | Status: DC

## 2013-12-26 ENCOUNTER — Other Ambulatory Visit: Payer: Self-pay | Admitting: Internal Medicine

## 2013-12-26 DIAGNOSIS — L97409 Non-pressure chronic ulcer of unspecified heel and midfoot with unspecified severity: Secondary | ICD-10-CM

## 2013-12-26 DIAGNOSIS — E1159 Type 2 diabetes mellitus with other circulatory complications: Secondary | ICD-10-CM

## 2013-12-26 DIAGNOSIS — E876 Hypokalemia: Secondary | ICD-10-CM

## 2013-12-27 ENCOUNTER — Telehealth: Payer: Self-pay

## 2013-12-27 ENCOUNTER — Encounter: Payer: Self-pay | Admitting: *Deleted

## 2013-12-27 NOTE — Telephone Encounter (Signed)
Error

## 2014-01-09 ENCOUNTER — Telehealth: Payer: Self-pay

## 2014-01-09 NOTE — Telephone Encounter (Signed)
Spoke to son (on [place]), who stated that patient had not had her eye exam this year.  They plan to schedule one this year.

## 2014-01-23 ENCOUNTER — Telehealth: Payer: Self-pay | Admitting: Internal Medicine

## 2014-01-23 NOTE — Telephone Encounter (Signed)
Seeing patient for wound care for right heel.  Nurse missed re certification period of last week.  Needs ok for a new start (chart) to continue previous orders.  Fax number [phone] (order skill nurse to evaluate and treat wound to right foot)

## 2014-01-23 NOTE — Telephone Encounter (Signed)
OK  Dx: pressure ulcer Uncontrolled DM with peripheral vascular disease

## 2014-01-23 NOTE — Telephone Encounter (Signed)
Faxed to fax # [phone] 

## 2014-01-31 ENCOUNTER — Other Ambulatory Visit: Payer: Self-pay

## 2014-01-31 MED ORDER — POTASSIUM CHLORIDE CRYS ER 20 MEQ PO TBCR: 20.0000 meq | EXTENDED_RELEASE_TABLET | Freq: Two times a day (BID) | ORAL | Status: DC

## 2014-02-03 ENCOUNTER — Other Ambulatory Visit: Payer: Self-pay

## 2014-02-03 MED ORDER — MEMANTINE HCL 10 MG PO TABS: 10.0000 mg | ORAL_TABLET | Freq: Two times a day (BID) | ORAL | Status: DC

## 2014-02-03 NOTE — Telephone Encounter (Signed)
Do not see on present medication list

## 2014-02-03 NOTE — Telephone Encounter (Signed)
OK X 3 mos 

## 2014-02-12 ENCOUNTER — Encounter: Payer: Self-pay | Admitting: *Deleted

## 2014-03-06 ENCOUNTER — Telehealth: Payer: Self-pay | Admitting: Internal Medicine

## 2014-03-08 ENCOUNTER — Emergency Department (HOSPITAL_COMMUNITY): Payer: Medicare Other

## 2014-03-08 ENCOUNTER — Encounter (HOSPITAL_COMMUNITY): Payer: Self-pay | Admitting: *Deleted

## 2014-03-08 ENCOUNTER — Emergency Department (HOSPITAL_COMMUNITY)
Admission: EM | Admit: 2014-03-08 | Discharge: 2014-03-08 | Disposition: A | Discharge status: Home / Self Care | Payer: Medicare Other | Attending: Emergency Medicine | Admitting: Emergency Medicine

## 2014-03-08 DIAGNOSIS — E119 Type 2 diabetes mellitus without complications: Secondary | ICD-10-CM | POA: Insufficient documentation

## 2014-03-08 DIAGNOSIS — Z950 Presence of cardiac pacemaker: Secondary | ICD-10-CM | POA: Insufficient documentation

## 2014-03-08 DIAGNOSIS — N390 Urinary tract infection, site not specified: Secondary | ICD-10-CM | POA: Insufficient documentation

## 2014-03-08 DIAGNOSIS — K5900 Constipation, unspecified: Secondary | ICD-10-CM | POA: Diagnosis not present

## 2014-03-08 DIAGNOSIS — Z794 Long term (current) use of insulin: Secondary | ICD-10-CM | POA: Insufficient documentation

## 2014-03-08 DIAGNOSIS — I10 Essential (primary) hypertension: Secondary | ICD-10-CM | POA: Diagnosis not present

## 2014-03-08 DIAGNOSIS — Z86718 Personal history of other venous thrombosis and embolism: Secondary | ICD-10-CM | POA: Insufficient documentation

## 2014-03-08 DIAGNOSIS — Z7901 Long term (current) use of anticoagulants: Secondary | ICD-10-CM | POA: Diagnosis not present

## 2014-03-08 DIAGNOSIS — Z79899 Other long term (current) drug therapy: Secondary | ICD-10-CM | POA: Insufficient documentation

## 2014-03-08 DIAGNOSIS — G309 Alzheimer's disease, unspecified: Secondary | ICD-10-CM | POA: Diagnosis not present

## 2014-03-08 DIAGNOSIS — Z862 Personal history of diseases of the blood and blood-forming organs and certain disorders involving the immune mechanism: Secondary | ICD-10-CM | POA: Diagnosis not present

## 2014-03-08 LAB — URINALYSIS, ROUTINE W REFLEX MICROSCOPIC
Bilirubin Urine: NEGATIVE
GLUCOSE, UA: NEGATIVE mg/dL
Ketones, ur: NEGATIVE mg/dL
Nitrite: POSITIVE — AB
PROTEIN: 30 mg/dL — AB
Specific Gravity, Urine: 1.017 (ref 1.005–1.030)
UROBILINOGEN UA: 0.2 mg/dL (ref 0.0–1.0)
pH: 5.5 (ref 5.0–8.0)

## 2014-03-08 LAB — COMPREHENSIVE METABOLIC PANEL
ALBUMIN: 3.8 g/dL (ref 3.5–5.2)
ALK PHOS: 66 U/L (ref 39–117)
ALT: 14 U/L (ref 0–35)
ANION GAP: 16 — AB (ref 5–15)
AST: 20 U/L (ref 0–37)
BUN: 18 mg/dL (ref 6–23)
CALCIUM: 9.7 mg/dL (ref 8.4–10.5)
CO2: 23 mEq/L (ref 19–32)
Chloride: 108 mEq/L (ref 96–112)
Creatinine, Ser: 0.91 mg/dL (ref 0.50–1.10)
GFR calc non Af Amer: 61 mL/min — ABNORMAL LOW (ref >90)
GFR, EST AFRICAN AMERICAN: 71 mL/min — AB (ref >90)
GLUCOSE: 99 mg/dL (ref 70–99)
POTASSIUM: 3.9 meq/L (ref 3.7–5.3)
SODIUM: 147 meq/L (ref 137–147)
Total Bilirubin: 0.4 mg/dL (ref 0.3–1.2)
Total Protein: 8.1 g/dL (ref 6.0–8.3)

## 2014-03-08 LAB — CBC WITH DIFFERENTIAL/PLATELET
BASOS PCT: 1 % (ref 0–1)
Basophils Absolute: 0.1 10*3/uL (ref 0.0–0.1)
EOS ABS: 0.2 10*3/uL (ref 0.0–0.7)
EOS PCT: 2 % (ref 0–5)
HCT: 34.6 % — ABNORMAL LOW (ref 36.0–46.0)
Hemoglobin: 10.8 g/dL — ABNORMAL LOW (ref 12.0–15.0)
Lymphocytes Relative: 36 % (ref 12–46)
Lymphs Abs: 2.9 10*3/uL (ref 0.7–4.0)
MCH: 27.6 pg (ref 26.0–34.0)
MCHC: 31.2 g/dL (ref 30.0–36.0)
MCV: 88.3 fL (ref 78.0–100.0)
Monocytes Absolute: 0.6 10*3/uL (ref 0.1–1.0)
Monocytes Relative: 7 % (ref 3–12)
NEUTROS PCT: 54 % (ref 43–77)
Neutro Abs: 4.5 10*3/uL (ref 1.7–7.7)
PLATELETS: 219 10*3/uL (ref 150–400)
RBC: 3.92 MIL/uL (ref 3.87–5.11)
RDW: 14 % (ref 11.5–15.5)
WBC: 8.2 10*3/uL (ref 4.0–10.5)

## 2014-03-08 LAB — URINE MICROSCOPIC-ADD ON

## 2014-03-08 LAB — LIPASE, BLOOD: Lipase: 39 U/L (ref 11–59)

## 2014-03-08 MED ORDER — SODIUM CHLORIDE 0.9 % IV BOLUS (SEPSIS)
1000.0000 mL | Freq: Once | INTRAVENOUS | Status: AC
  Administered 2014-03-08: 1000 mL via INTRAVENOUS

## 2014-03-08 MED ORDER — QUETIAPINE FUMARATE 50 MG PO TABS: 50.0000 mg | ORAL_TABLET | Freq: Every day | ORAL | Status: DC

## 2014-03-08 MED ORDER — CEPHALEXIN 500 MG PO CAPS: 500.0000 mg | ORAL_CAPSULE | Freq: Three times a day (TID) | ORAL | Status: DC

## 2014-03-08 MED ORDER — CEPHALEXIN 250 MG PO CAPS
500.0000 mg | ORAL_CAPSULE | Freq: Once | ORAL | Status: AC
  Administered 2014-03-08: 500 mg via ORAL
  Filled 2014-03-08: qty 2

## 2014-03-08 MED ORDER — POLYETHYLENE GLYCOL 3350 17 G PO PACK: 17.0000 g | PACK | Freq: Every day | ORAL | Status: DC

## 2014-03-08 NOTE — ED Notes (Signed)
Pt has dementia, family reports pt being constipated and last bm was over a week ago. Pt has become combative with family and home health. Family also reports recent dark and cloudy urine.

## 2014-03-08 NOTE — ED Provider Notes (Signed)
CSN: [id number]     Arrival date & time 03/08/14  1600 History   First MD Initiated Contact with Patient 03/08/14 1751     Chief Complaint  Patient presents with  . Constipation     (Consider location/radiation/quality/duration/timing/severity/associated sxs/prior Treatment) Patient is a 73 y.o. female presenting with constipation.  Constipation Severity:  Mild Timing:  Constant Progression:  Worsening Chronicity:  Chronic Context: dehydration   Context: not narcotics and not stress   Relieved by: disimpaction. Associated symptoms: no diarrhea and no hematochezia     Past Medical History  Diagnosis Date  . Dysphagia   . Diabetes mellitus   . Alzheimer disease   . Hypokalemia   . Anemia   . 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 DVT (deep venous thrombosis)     [facility]  . Hypertension    Past Surgical History  Procedure Laterality Date  . Syncope  05/2001    pacer  . Pacemaker placement     Family History  Problem Relation Age of Onset  . Alzheimer's disease    . Diabetes    . Stroke Father   . Diabetes Brother   . Seizures Brother   . Diabetes Brother    History  Substance Use Topics  . Smoking status: Never Smoker   . Smokeless tobacco: Not on file     Comment: Patient exposed to second hand smoke daily   . Alcohol Use: No   OB History    No data available     Review of Systems  Unable to perform ROS: Dementia  Gastrointestinal: Positive for constipation. Negative for diarrhea and hematochezia.  Psychiatric/Behavioral: Positive for agitation.      Allergies  Ace inhibitors  Home Medications   Prior to Admission medications   Medication Sig Start Date End Date Taking? Authorizing Provider  collagenase (SANTYL) ointment Apply topically daily. 11/27/13   Maryann Mikhail, DO  insulin aspart (NOVOLOG FLEXPEN) 100 UNIT/ML FlexPen Inject 2-10 Units into the skin every morning. 200-250=2 units 251-300=4 units 301-350=6 units 351-400=8  units 401-500=10 units Over 400 call MD    Historical Provider, MD  insulin aspart (NOVOLOG) 100 UNIT/ML injection Inject 6 Units into the vein once. 11/27/13   Maryann Mikhail, DO  Insulin Detemir (LEVEMIR FLEXPEN) 100 UNIT/ML Pen Inject 19 Units into the skin every evening. 12/07/13   Hendricks Limes, MD  memantine (NAMENDA) 10 MG tablet Take 1 tablet (10 mg total) by mouth 2 (two) times daily. 02/03/14   Hendricks Limes, MD  Memantine HCl ER (NAMENDA XR) 28 MG CP24 Take 28 mg by mouth daily.    Historical Provider, MD  Multiple Vitamin (MULTIVITAMIN WITH MINERALS) TABS tablet Take 1 tablet by mouth daily. 11/27/13   Maryann Mikhail, DO  potassium chloride SA (K-DUR,KLOR-CON) 20 MEQ tablet Take 1 tablet (20 mEq total) by mouth 2 (two) times daily. 01/31/14   Hendricks Limes, MD  pravastatin (PRAVACHOL) 40 MG tablet Take 40 mg by mouth daily.    Historical Provider, MD  warfarin (COUMADIN) 4 MG tablet Take 1 tablet (4 mg total) by mouth as directed. 12/25/13   Evans Lance, MD   BP 143/89 mmHg  Pulse 72  Temp(Src) 98.7 F (37.1 C) (Oral)  Resp 18  SpO2 100% Physical Exam  Constitutional: She appears well-developed and well-nourished. No distress.  HENT:  Head: Normocephalic and atraumatic.  Eyes: Conjunctivae are normal. Pupils are equal, round, and reactive to light.  Neck: Normal range of  motion.  Cardiovascular: Normal rate and regular rhythm.   Pulmonary/Chest: Effort normal and breath sounds normal.  Abdominal: Soft. She exhibits no distension. There is no tenderness.  Neurological: She is alert.  Skin: Skin is warm and dry.  Nursing note and vitals reviewed.   ED Course  Procedures (including critical care time) Labs Review Labs Reviewed - No data to display  Imaging Review No results found.   EKG Interpretation None      MDM   Final diagnoses:  None    73 year old female with severe dementia and unable to cooperate with history or physical. Family supplement  history the patient has thank Hospital for a long period time has had worsening pain with bowel movements recently. Also with some bleeding from hemorrhoid. Also with decreased urine output and increase in smell and color of said urine. Patient has become more combative over the last few weeks as well. Family history placing her in a facility in the past however cannot get insurance to pay for it. Exam patient belly is soft and no apparent tenderness however patient is nonverbal. No obvious rashes. Rectal exam she has some skin tags no thrombosed hemorrhoids no active bleeding that I can tell. Hemoccult negative. Patient's constipation is chronic, not impacted on my exam no need for aggressive treatment in the emergency department today. We will get a abdominal series to evaluate for obstruction if this is negative we will stop there. For the patient's change in urine pattern we will make sure she doesn't have an infection that could be causing all of these problems. If all of her labs and imaging are negative she'll be discharged to the care of her family.  Labs and imaging reveal UTI without any evidence of systemic infection. Patient observed in the emergency department for a prolonged period time and still calm and a problem. Given dose of Keflex was tolerating by mouth just fine patient will be discharged home to the care of her family. Prescription for cervical as written secondary to patient's family's request for a medication for combativeness, will follow up with family doctor for further adjustments as needed.   Merrily Pew, MD 03/08/14 7972  Pamella Pert, MD 03/09/14 [phone]

## 2014-03-08 NOTE — ED Notes (Signed)
Called lab to inquire about them receiving blood samples for patient. Lab did not receive blood samples.

## 2014-03-09 ENCOUNTER — Other Ambulatory Visit: Payer: Self-pay | Admitting: Internal Medicine

## 2014-03-10 LAB — POC OCCULT BLOOD, ED: Fecal Occult Bld: NEGATIVE

## 2014-03-12 ENCOUNTER — Other Ambulatory Visit (INDEPENDENT_AMBULATORY_CARE_PROVIDER_SITE_OTHER): Payer: Medicare Other

## 2014-03-12 ENCOUNTER — Encounter: Payer: Self-pay | Admitting: Internal Medicine

## 2014-03-12 ENCOUNTER — Ambulatory Visit (INDEPENDENT_AMBULATORY_CARE_PROVIDER_SITE_OTHER): Payer: Medicare Other | Admitting: Internal Medicine

## 2014-03-12 VITALS — BP 120/90 | HR 68

## 2014-03-12 DIAGNOSIS — F03918 Unspecified dementia, unspecified severity, with other behavioral disturbance: Secondary | ICD-10-CM

## 2014-03-12 DIAGNOSIS — F0391 Unspecified dementia with behavioral disturbance: Secondary | ICD-10-CM

## 2014-03-12 DIAGNOSIS — E162 Hypoglycemia, unspecified: Secondary | ICD-10-CM

## 2014-03-12 LAB — HEMOGLOBIN A1C: Hgb A1c MFr Bld: 7 % — ABNORMAL HIGH (ref 4.6–6.5)

## 2014-03-12 MED ORDER — INSULIN LISPRO 100 UNIT/ML (KWIKPEN): PEN_INJECTOR | SUBCUTANEOUS | Status: DC

## 2014-03-12 NOTE — Progress Notes (Signed)
   Subjective:    Patient ID: [name], female    DOB: Jun 05, 1940, 73 y.o.   MRN: [id number]  HPI   She was seen in the ER 03/08/14 with urinary tract infection. She was placed on Keflex .  At that time Seroquel was prescribed because of behavioral issues manifested as combative behavior. She was striking out at family and caregivers. She also would throw herself against the wall as if trying to escape. She would resist being fed or dressed.  She is on Namenda 10 mg twice a day from her neurologist   Review of Systems  Her fasting blood sugars have been markedly variable. They range from 41-163. Even with the low glucoses she has not had hypoglycemic spells per se.  Her last A1c was 8.9% on 12/04/13.  The ulcer of the right heel has been dressed daily and is dramatically improved.      Objective:   Physical Exam Pertinent or positive findings include: She sits in a wheelchair with no interaction. Her face is hidden in her palm.No verbal interaction. An S4 is noted without murmurs or gallops Breath sounds are decreased but she has no increased work of breathing Pedal pulses are decreased but present She has a ulcer of the right heel which is healing well. There is granulomatous change without cellulitis or purulence  General appearance :adequately nourished; in no distress. Eyes: No conjunctival inflammation or scleral icterus is present. Abdomen: bowel sounds normal, soft and non-tender without masses, organomegaly or hernias noted.  No guarding or rebound.  Vascular : all pulses equal ; no bruits present. Skin:Warm & dry.  Intact without suspicious lesions or rashes ; no jaundice or tenting Lymphatic: No lymphadenopathy is noted about the head, neck, axilla.           Assessment & Plan:  #1 behavioral issues, subjective improvement with Seroquel. No change in medication made; she will follow-up with neurologist for medication regimen management   #2 documented  hypoglycemia without clinical manifestations of such. A1c will be checked.

## 2014-03-12 NOTE — Patient Instructions (Signed)
The Neurology referral will be scheduled and you'll be notified of the time.Please call the Referral Co-Ordinator @ [phone] if you have not been notified of appointment time within 7-10 days. Your next office appointment will be determined based upon review of your pending labs . Those instructions will be transmitted to you by mail

## 2014-03-12 NOTE — Progress Notes (Signed)
Pre visit review using our clinic review tool, if applicable. No additional management support is needed unless otherwise documented below in the visit note. 

## 2014-03-13 ENCOUNTER — Other Ambulatory Visit: Payer: Self-pay | Admitting: Internal Medicine

## 2014-03-13 DIAGNOSIS — E1149 Type 2 diabetes mellitus with other diabetic neurological complication: Secondary | ICD-10-CM

## 2014-04-01 ENCOUNTER — Encounter: Payer: Self-pay | Admitting: *Deleted

## 2014-04-03 ENCOUNTER — Encounter: Payer: Self-pay | Admitting: Internal Medicine

## 2014-04-03 ENCOUNTER — Telehealth: Payer: Self-pay | Admitting: Internal Medicine

## 2014-04-03 NOTE — Telephone Encounter (Signed)
SENT PAST DUE PACEMAKER [facility], NEEDS TAYLOR LAST SEEN 10-05-10, LAST CLINIC 10-2012/MT

## 2014-04-15 ENCOUNTER — Encounter: Payer: Self-pay | Admitting: *Deleted

## 2014-04-16 ENCOUNTER — Other Ambulatory Visit: Payer: Self-pay | Admitting: Internal Medicine

## 2014-04-16 NOTE — Telephone Encounter (Signed)
Verify Rxing MD with pharmacy Usually from Neurologist or Psychiatrist

## 2014-04-17 ENCOUNTER — Other Ambulatory Visit: Payer: Self-pay | Admitting: Internal Medicine

## 2014-04-17 DIAGNOSIS — G309 Alzheimer's disease, unspecified: Principal | ICD-10-CM

## 2014-04-17 DIAGNOSIS — F0280 Dementia in other diseases classified elsewhere without behavioral disturbance: Secondary | ICD-10-CM

## 2014-04-17 MED ORDER — QUETIAPINE FUMARATE 50 MG PO TABS: 50.0000 mg | ORAL_TABLET | Freq: Every day | ORAL | Status: DC

## 2014-04-17 NOTE — Telephone Encounter (Signed)
Rxed in ER I'll renew it X 30 days until seen by Neurology Referral placed 03/12/14

## 2014-04-17 NOTE — Telephone Encounter (Signed)
Son is requesting refill for seroquel to be sent to [facility] on [address].

## 2014-04-18 ENCOUNTER — Telehealth: Payer: Self-pay | Admitting: Neurology

## 2014-04-18 MED ORDER — QUETIAPINE FUMARATE 50 MG PO TABS: 50.0000 mg | ORAL_TABLET | Freq: Every day | ORAL | Status: DC

## 2014-04-18 NOTE — Telephone Encounter (Signed)
Cole from Dr. Clayborn Heron office called to cancel pt's NP appt w/ Dr. Tomi Likens on 05/22/14. Armine stated that this appt was suppose to be with Dr. Jannifer Franklin instead.

## 2014-04-18 NOTE — Addendum Note (Signed)
Addended by: Roma Schanz R on: 04/18/2014 08:00 AM   Modules accepted: Orders

## 2014-04-18 NOTE — Telephone Encounter (Signed)
rx electronically sent.

## 2014-05-12 ENCOUNTER — Other Ambulatory Visit: Payer: Self-pay

## 2014-05-12 MED ORDER — MEMANTINE HCL 10 MG PO TABS: 10.0000 mg | ORAL_TABLET | Freq: Two times a day (BID) | ORAL | Status: AC

## 2014-05-14 ENCOUNTER — Ambulatory Visit: Payer: Medicare Other | Admitting: Neurology

## 2014-05-29 ENCOUNTER — Encounter: Payer: Self-pay | Admitting: *Deleted

## 2014-06-01 ENCOUNTER — Other Ambulatory Visit: Payer: Self-pay | Admitting: Internal Medicine

## 2014-06-05 ENCOUNTER — Other Ambulatory Visit: Payer: Self-pay | Admitting: Internal Medicine

## 2014-06-06 ENCOUNTER — Other Ambulatory Visit: Payer: Self-pay | Admitting: Internal Medicine

## 2014-06-09 ENCOUNTER — Telehealth: Payer: Self-pay | Admitting: Neurology

## 2014-06-09 ENCOUNTER — Other Ambulatory Visit: Payer: Self-pay

## 2014-06-09 MED ORDER — ONETOUCH ULTRA 2 W/DEVICE KIT: PACK | Status: AC

## 2014-06-09 MED ORDER — ONETOUCH DELICA LANCETS 33G MISC: Status: AC

## 2014-06-09 MED ORDER — GLUCOSE BLOOD VI STRP: ORAL_STRIP | Status: AC

## 2014-06-09 NOTE — Telephone Encounter (Signed)
Pt canceled appt to see Dr Tomi Likens on 05-14-14 going to [facility] notified Dr Linna Darner office

## 2014-06-09 NOTE — Telephone Encounter (Signed)
03/12/14 last seen in the office  04/18/14 med last sent to pharmacy

## 2014-06-09 NOTE — Telephone Encounter (Signed)
30

## 2014-06-13 ENCOUNTER — Encounter: Payer: Self-pay | Admitting: Neurology

## 2014-06-13 ENCOUNTER — Ambulatory Visit (INDEPENDENT_AMBULATORY_CARE_PROVIDER_SITE_OTHER): Payer: Medicare Other | Admitting: *Deleted

## 2014-06-13 ENCOUNTER — Ambulatory Visit (INDEPENDENT_AMBULATORY_CARE_PROVIDER_SITE_OTHER): Payer: Medicare Other | Admitting: Neurology

## 2014-06-13 VITALS — BP 178/96 | HR 67 | Ht 70.0 in | Wt 123.0 lb

## 2014-06-13 DIAGNOSIS — R269 Unspecified abnormalities of gait and mobility: Secondary | ICD-10-CM | POA: Insufficient documentation

## 2014-06-13 DIAGNOSIS — G309 Alzheimer's disease, unspecified: Secondary | ICD-10-CM

## 2014-06-13 DIAGNOSIS — Z5181 Encounter for therapeutic drug level monitoring: Secondary | ICD-10-CM

## 2014-06-13 DIAGNOSIS — D735 Infarction of spleen: Secondary | ICD-10-CM

## 2014-06-13 DIAGNOSIS — F0280 Dementia in other diseases classified elsewhere without behavioral disturbance: Secondary | ICD-10-CM

## 2014-06-13 HISTORY — DX: Unspecified abnormalities of gait and mobility: R26.9

## 2014-06-13 LAB — POCT INR: INR: 2.2

## 2014-06-13 NOTE — Progress Notes (Signed)
Reason for visit: Alzheimer's disease  [name] is an 74 y.o. female  History of present illness:  [name] is a 74 year old right-handed black female with a history of a progressive dementia secondary to Alzheimer's disease. The patient has continued to progress, she is nonambulatory at this point, she is nonverbal. The patient will eat fairly well, but she requires assistance with all activities of daily living including feeding. The patient will pocket food in her mouth. The pills need to be crushed and placed with food. The patient is sleeping most of the day and night. She will occasionally have some reactive aggression. She will occasionally have Seroquel to take if needed. The patient is married, and her husband is cognitively intact. The patient returns for an evaluation. The patient remains on Namenda.  Past Medical History  Diagnosis Date  . Dysphagia   . Diabetes mellitus   . Alzheimer disease   . Hypokalemia   . Anemia   . 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 DVT (deep venous thrombosis)     [facility]  . Hypertension     Past Surgical History  Procedure Laterality Date  . Syncope  05/2001    pacer  . Pacemaker placement      Family History  Problem Relation Age of Onset  . Alzheimer's disease    . Diabetes    . Stroke Father   . Diabetes Brother   . Seizures Brother   . Diabetes Brother     Social history:  reports that she has never smoked. She does not have any smokeless tobacco history on file. She reports that she does not drink alcohol or use illicit drugs.    Allergies  Allergen Reactions  . Ace Inhibitors Cough    Medications:  Prior to Admission medications   Medication Sig Start Date End Date Taking? Authorizing Provider  Blood Glucose Monitoring Suppl (ONE TOUCH ULTRA 2) W/DEVICE KIT Use to test blood sugars before each meal and at bedtime so four times daily ICD 10 E11 59 06/09/14  Yes Hendricks Limes, MD  cephALEXin (KEFLEX)  500 MG capsule Take 1 capsule (500 mg total) by mouth 3 (three) times daily. 03/08/14  Yes Merrily Pew, MD  glucose blood test strip ONETOUCH ULTRA ULTRA TEST STRIPS Use to test blood sugar before each meal and at bedtime so 4 times daily ICD 10 E11 59 06/09/14  Yes Hendricks Limes, MD  Insulin Detemir (LEVEMIR FLEXPEN) 100 UNIT/ML Pen Inject 18 Units into the skin every evening. 15 units each evening 03/13/14  Yes Hendricks Limes, MD  insulin lispro (HUMALOG) 100 UNIT/ML injection Inject into the skin 3 (three) times daily before meals.   Yes Historical Provider, MD  memantine (NAMENDA) 10 MG tablet Take 1 tablet (10 mg total) by mouth 2 (two) times daily. 05/12/14  Yes Hendricks Limes, MD  [facility] DELICA LANCETS 67M MISC Use to test blood sugar before each meal and at bedtime so four times dialy ICD 10 E11 59 06/09/14  Yes Hendricks Limes, MD  potassium chloride SA (K-DUR,KLOR-CON) 20 MEQ tablet Take 1 tablet (20 mEq total) by mouth 2 (two) times daily. 01/31/14  Yes Hendricks Limes, MD  pravastatin (PRAVACHOL) 40 MG tablet Take 40 mg by mouth daily.   Yes Historical Provider, MD  QUEtiapine (SEROQUEL) 50 MG tablet TAKE 1 TABLET (50 MG TOTAL) BY MOUTH AT BEDTIME. 06/09/14  Yes Hendricks Limes, MD  warfarin (COUMADIN)  4 MG tablet Take 1 tablet (4 mg total) by mouth as directed. Patient taking differently: Take 4-6 mg by mouth as directed. Takes $RemoveBeforeD'6mg'NBOgizZJJaiDyf$  on Wed and Sat Takes $RemoveB'4mg'SaaVjdPh$  all other days 12/25/13  Yes Evans Lance, MD    ROS:  Out of a complete 14 system review of symptoms, the patient complains only of the following symptoms, and all other reviewed systems are negative.  Weight loss Memory loss Sleepiness Frequent infections  Blood pressure 178/96, pulse 67, height $RemoveBe'5\' 10"'eHMNWIyOn$  (1.778 m), weight 123 lb (55.792 kg).  Physical Exam  General: The patient is sleepy, difficult to arouse.  Skin: No significant peripheral edema is noted.   Neurologic Exam  Mental status: The patient is sleepy  and nonverbal, unable to participate for Mini-Mental Status Examination today.   Cranial nerves: Facial symmetry is present. The patient is sitting in a wheelchair, will not open her eyes, nonverbal.  Motor: The patient has good strength in all 4 extremities. The patient has gegenhalten rigidity on all 4 extremities, will not cooperate for examination.  Sensory examination: The patient has some response to pain stimulation on all fours.  Coordination: The patient is unable to cooperate for cerebellar testing.  Gait and station: The patient is wheelchair-bound, the patient demonstrates rigidity, resisting movements when trying to stand and ambulate. The patient is unable to walk.  Reflexes: Deep tendon reflexes are symmetric.   Assessment/Plan:  1. End-stage Alzheimer's disease  2. Gait disorder  Much of the revisit time today was spent discussing issues with the family, approximately 20-25 minutes. We have discussed a DO NOT RESUSCITATE status for the patient, they have agreed to this. I have signed a form today for DO NOT RESUSCITATE. The patient is not getting any medications to this office, and we have little to offer at this point. A form for DMV was also given. The patient will follow-up through this office if needed.  Jill Alexanders MD 06/13/2014 4:09 PM  [facility] [address] [place], [place] [address]  Phone [phone] Fax [phone]

## 2014-06-13 NOTE — Patient Instructions (Signed)
Alzheimer Disease Caregiver Guide Alzheimer disease is an illness that affects a person's brain. It causes a person to lose the ability to remember things and make good decisions. As the disease progresses, the person is unable to take care of himself or herself and needs more and more help to do simple tasks. Taking care of someone with Alzheimer disease can be very challenging and overwhelming.  MEMORY LOSS AND CONFUSION Memory loss and confusion is mild in the beginning stages of the disease. Both of these problems become more severe as the disease progresses. Eventually, the person will not recognize places or even close family members and friends.   Stay calm.  Respond with a short explanation. Long explanations can be overwhelming and confusing.  Avoid corrections that sound like scolding.  Try not to take it personally, even if the person forgets your name. BEHAVIOR CHANGES Behavior changes are part of the disease. The person may develop depression, anxiety, anger, hallucinations, or other behavior changes. These changes can come on suddenly and may be in response to pain, infection, changes in the environment (temperature, noise), overstimulation, or feeling lost or scared.   Try not to take behavior changes personally.  Remain calm and patient.  Do not argue or try to convince the person about a specific point. This will only make him or her more agitated.  Know that the behavior changes are part of the disease process and try to work through it. TIPS TO REDUCE FRUSTRATION  Schedule wisely by making appointments and doing daily tasks, like bathing and dressing, when the person is at his or her best.  Take your time. Simple tasks may take a lot longer, so be sure to allow for plenty of time.  Limit choices. Too many choices can be overwhelming and stressful for the person.  Involve the person in what you are doing.  Stick to a routine.  Avoid new or crowded situations, if  possible.  Use simple words, short sentences, and a calm voice. Only give one direction at a time.  Buy clothes and shoes that are easy to put on and take off.  Let people help if they offer. HOME SAFETY Keeping the home safe is very important to reduce the risk of falls and injuries.   Keep floors clear of clutter. Remove rugs, magazine racks, and floor lamps.  Keep hallways well lit.  Put a handrail and nonslip mat in the bathtub or shower.  Put childproof locks on cabinets with dangerous items, such as medicine, alcohol, guns, toxic cleaning items, sharp tools or utensils, matches, or lighters.  Place locks on doors where the person cannot easily see or reach them. This helps ensure that the person cannot wander out of the house and get lost.  Be prepared for emergencies. Keep a list of emergency phone numbers and addresses in a convenient area. PLANS FOR THE FUTURE  Do not put off talking about finances.  Talk about money management. People with Alzheimer disease have trouble managing their money as the disease gets worse.  Get help from professional advisors regarding financial and legal matters.  Do not put off talking about future care.  Choose a power of attorney. This is someone who can make decisions for the person with Alzheimer disease when he or she is no longer able to do so.  Talk about driving and when it is the right time to stop. The person's health care provider can help give advice on this matter.  Talk about   the person's living situation. If he or she lives alone, you need to make sure he or she is safe. Some people need extra help at home, and others need more care at a nursing home or care center. SUPPORT GROUPS Joining a support group can be very helpful for caregivers of people with Alzheimer disease. Some advantages to being part of a support group include:   Getting strategies to manage stress.  Sharing experiences with others.  Receiving  emotional comfort and support.  Learning new caregiving skills as the disease progresses.  Knowing what community resources are available and taking advantage of them. SEEK MEDICAL CARE IF:  The person has a fever.  The person has a sudden change in behavior that does not improve with calming strategies.  The person is unable to manage in his or her current living situation.  The person threatens you or anyone else, including himself or herself.  You are no longer able to care for the person. Document Released: 12/01/2003 Document Revised: 08/05/2013 Document Reviewed: 04/27/2011 ExitCare Patient Information 2015 [employer]. This information is not intended to replace advice given to you by your health care provider. Make sure you discuss any questions you have with your health care provider.  

## 2014-07-18 ENCOUNTER — Inpatient Hospital Stay (HOSPITAL_COMMUNITY)
Admission: EM | Admit: 2014-07-18 | Discharge: 2014-07-25 | DRG: 056 | Disposition: A | Discharge status: Hospice | Payer: Medicare Other | Attending: Internal Medicine | Admitting: Internal Medicine

## 2014-07-18 ENCOUNTER — Emergency Department (HOSPITAL_COMMUNITY): Payer: Medicare Other

## 2014-07-18 ENCOUNTER — Ambulatory Visit: Payer: Medicare Other | Admitting: Internal Medicine

## 2014-07-18 ENCOUNTER — Encounter (HOSPITAL_COMMUNITY): Payer: Self-pay | Admitting: Neurology

## 2014-07-18 ENCOUNTER — Encounter: Payer: Self-pay | Admitting: Internal Medicine

## 2014-07-18 DIAGNOSIS — Z950 Presence of cardiac pacemaker: Secondary | ICD-10-CM | POA: Diagnosis not present

## 2014-07-18 DIAGNOSIS — Z515 Encounter for palliative care: Secondary | ICD-10-CM

## 2014-07-18 DIAGNOSIS — I480 Paroxysmal atrial fibrillation: Secondary | ICD-10-CM | POA: Diagnosis not present

## 2014-07-18 DIAGNOSIS — N390 Urinary tract infection, site not specified: Secondary | ICD-10-CM | POA: Diagnosis present

## 2014-07-18 DIAGNOSIS — Z681 Body mass index (BMI) 19 or less, adult: Secondary | ICD-10-CM

## 2014-07-18 DIAGNOSIS — E870 Hyperosmolality and hypernatremia: Secondary | ICD-10-CM | POA: Diagnosis present

## 2014-07-18 DIAGNOSIS — Z7901 Long term (current) use of anticoagulants: Secondary | ICD-10-CM | POA: Diagnosis not present

## 2014-07-18 DIAGNOSIS — D696 Thrombocytopenia, unspecified: Secondary | ICD-10-CM | POA: Diagnosis present

## 2014-07-18 DIAGNOSIS — Z86718 Personal history of other venous thrombosis and embolism: Secondary | ICD-10-CM | POA: Diagnosis not present

## 2014-07-18 DIAGNOSIS — G309 Alzheimer's disease, unspecified: Secondary | ICD-10-CM | POA: Diagnosis not present

## 2014-07-18 DIAGNOSIS — I2510 Atherosclerotic heart disease of native coronary artery without angina pectoris: Secondary | ICD-10-CM | POA: Diagnosis present

## 2014-07-18 DIAGNOSIS — I129 Hypertensive chronic kidney disease with stage 1 through stage 4 chronic kidney disease, or unspecified chronic kidney disease: Secondary | ICD-10-CM | POA: Diagnosis present

## 2014-07-18 DIAGNOSIS — R531 Weakness: Secondary | ICD-10-CM | POA: Diagnosis not present

## 2014-07-18 DIAGNOSIS — Z66 Do not resuscitate: Secondary | ICD-10-CM | POA: Insufficient documentation

## 2014-07-18 DIAGNOSIS — G9341 Metabolic encephalopathy: Secondary | ICD-10-CM | POA: Diagnosis present

## 2014-07-18 DIAGNOSIS — R400 Somnolence: Secondary | ICD-10-CM

## 2014-07-18 DIAGNOSIS — R64 Cachexia: Secondary | ICD-10-CM | POA: Diagnosis present

## 2014-07-18 DIAGNOSIS — Z8659 Personal history of other mental and behavioral disorders: Secondary | ICD-10-CM

## 2014-07-18 DIAGNOSIS — F0391 Unspecified dementia with behavioral disturbance: Secondary | ICD-10-CM | POA: Diagnosis not present

## 2014-07-18 DIAGNOSIS — N189 Chronic kidney disease, unspecified: Secondary | ICD-10-CM | POA: Diagnosis present

## 2014-07-18 DIAGNOSIS — E11649 Type 2 diabetes mellitus with hypoglycemia without coma: Secondary | ICD-10-CM | POA: Diagnosis present

## 2014-07-18 DIAGNOSIS — N179 Acute kidney failure, unspecified: Secondary | ICD-10-CM | POA: Diagnosis present

## 2014-07-18 DIAGNOSIS — E162 Hypoglycemia, unspecified: Secondary | ICD-10-CM | POA: Diagnosis not present

## 2014-07-18 DIAGNOSIS — I82409 Acute embolism and thrombosis of unspecified deep veins of unspecified lower extremity: Secondary | ICD-10-CM | POA: Diagnosis present

## 2014-07-18 DIAGNOSIS — I739 Peripheral vascular disease, unspecified: Secondary | ICD-10-CM | POA: Diagnosis present

## 2014-07-18 DIAGNOSIS — G92 Toxic encephalopathy: Secondary | ICD-10-CM | POA: Diagnosis present

## 2014-07-18 DIAGNOSIS — E1149 Type 2 diabetes mellitus with other diabetic neurological complication: Secondary | ICD-10-CM

## 2014-07-18 DIAGNOSIS — J189 Pneumonia, unspecified organism: Secondary | ICD-10-CM | POA: Diagnosis present

## 2014-07-18 DIAGNOSIS — R410 Disorientation, unspecified: Secondary | ICD-10-CM | POA: Diagnosis not present

## 2014-07-18 DIAGNOSIS — E876 Hypokalemia: Secondary | ICD-10-CM | POA: Diagnosis not present

## 2014-07-18 DIAGNOSIS — E878 Other disorders of electrolyte and fluid balance, not elsewhere classified: Secondary | ICD-10-CM | POA: Diagnosis present

## 2014-07-18 DIAGNOSIS — F0280 Dementia in other diseases classified elsewhere without behavioral disturbance: Secondary | ICD-10-CM | POA: Diagnosis present

## 2014-07-18 DIAGNOSIS — Z794 Long term (current) use of insulin: Secondary | ICD-10-CM | POA: Diagnosis not present

## 2014-07-18 DIAGNOSIS — F03918 Unspecified dementia, unspecified severity, with other behavioral disturbance: Secondary | ICD-10-CM

## 2014-07-18 DIAGNOSIS — R4182 Altered mental status, unspecified: Secondary | ICD-10-CM | POA: Diagnosis present

## 2014-07-18 DIAGNOSIS — D473 Essential (hemorrhagic) thrombocythemia: Secondary | ICD-10-CM | POA: Diagnosis present

## 2014-07-18 DIAGNOSIS — E860 Dehydration: Secondary | ICD-10-CM | POA: Diagnosis present

## 2014-07-18 DIAGNOSIS — D75839 Thrombocytosis, unspecified: Secondary | ICD-10-CM | POA: Diagnosis present

## 2014-07-18 HISTORY — DX: Chronic kidney disease, unspecified: N18.9

## 2014-07-18 HISTORY — DX: Other specified health status: Z78.9

## 2014-07-18 HISTORY — DX: Unspecified convulsions: R56.9

## 2014-07-18 HISTORY — DX: Peripheral vascular disease, unspecified: I73.9

## 2014-07-18 HISTORY — DX: Atherosclerotic heart disease of native coronary artery without angina pectoris: I25.10

## 2014-07-18 HISTORY — DX: Presence of cardiac pacemaker: Z95.0

## 2014-07-18 LAB — GLUCOSE, CAPILLARY
Glucose-Capillary: 182 mg/dL — ABNORMAL HIGH (ref 70–99)
Glucose-Capillary: 159 mg/dL — ABNORMAL HIGH (ref 70–99)

## 2014-07-18 LAB — BASIC METABOLIC PANEL
BUN: 69 mg/dL — ABNORMAL HIGH (ref 6–23)
CALCIUM: 9.2 mg/dL (ref 8.4–10.5)
CO2: 21 mmol/L (ref 19–32)
CREATININE: 1.54 mg/dL — AB (ref 0.50–1.10)
GFR calc Af Amer: 37 mL/min — ABNORMAL LOW (ref >90)
GFR calc non Af Amer: 32 mL/min — ABNORMAL LOW (ref >90)
GLUCOSE: 227 mg/dL — AB (ref 70–99)
Potassium: 3.8 mmol/L (ref 3.5–5.1)
Sodium: >180 mmol/L (ref 135–145)

## 2014-07-18 LAB — URINE MICROSCOPIC-ADD ON

## 2014-07-18 LAB — I-STAT VENOUS BLOOD GAS, ED
ACID-BASE DEFICIT: 2 mmol/L (ref 0.0–2.0)
Bicarbonate: 25.1 mEq/L — ABNORMAL HIGH (ref 20.0–24.0)
O2 Saturation: 29 %
PCO2 VEN: 51.9 mmHg — AB (ref 45.0–50.0)
PH VEN: 7.293 (ref 7.250–7.300)
TCO2: 27 mmol/L (ref 0–100)
pO2, Ven: 21 mmHg — CL (ref 30.0–45.0)

## 2014-07-18 LAB — RAPID URINE DRUG SCREEN, HOSP PERFORMED
Amphetamines: NOT DETECTED
Barbiturates: NOT DETECTED
Benzodiazepines: NOT DETECTED
COCAINE: NOT DETECTED
OPIATES: NOT DETECTED
TETRAHYDROCANNABINOL: NOT DETECTED

## 2014-07-18 LAB — CBC WITH DIFFERENTIAL/PLATELET
BASOS PCT: 1 % (ref 0–1)
Basophils Absolute: 0.1 10*3/uL (ref 0.0–0.1)
EOS ABS: 0.1 10*3/uL (ref 0.0–0.7)
EOS PCT: 2 % (ref 0–5)
HEMATOCRIT: 42 % (ref 36.0–46.0)
HEMOGLOBIN: 12.3 g/dL (ref 12.0–15.0)
Lymphocytes Relative: 33 % (ref 12–46)
Lymphs Abs: 2.3 10*3/uL (ref 0.7–4.0)
MCH: 28.5 pg (ref 26.0–34.0)
MCHC: 29.3 g/dL — AB (ref 30.0–36.0)
MCV: 97.2 fL (ref 78.0–100.0)
MONO ABS: 0.3 10*3/uL (ref 0.1–1.0)
Monocytes Relative: 4 % (ref 3–12)
NEUTROS ABS: 4.3 10*3/uL (ref 1.7–7.7)
NEUTROS PCT: 61 % (ref 43–77)
Platelets: 138 10*3/uL — ABNORMAL LOW (ref 150–400)
RBC: 4.32 MIL/uL (ref 3.87–5.11)
RDW: 16.8 % — ABNORMAL HIGH (ref 11.5–15.5)
WBC: 7.1 10*3/uL (ref 4.0–10.5)

## 2014-07-18 LAB — URINALYSIS, ROUTINE W REFLEX MICROSCOPIC
BILIRUBIN URINE: NEGATIVE
GLUCOSE, UA: NEGATIVE mg/dL
KETONES UR: 15 mg/dL — AB
Nitrite: NEGATIVE
Specific Gravity, Urine: 1.023 (ref 1.005–1.030)
Urobilinogen, UA: 0.2 mg/dL (ref 0.0–1.0)
pH: 8 (ref 5.0–8.0)

## 2014-07-18 LAB — PROTIME-INR
INR: 2.68 — AB (ref 0.00–1.49)
Prothrombin Time: 28.7 seconds — ABNORMAL HIGH (ref 11.6–15.2)

## 2014-07-18 LAB — CBG MONITORING, ED
GLUCOSE-CAPILLARY: 241 mg/dL — AB (ref 70–99)
Glucose-Capillary: 25 mg/dL — CL (ref 70–99)
Glucose-Capillary: 30 mg/dL — CL (ref 70–99)
Glucose-Capillary: 379 mg/dL — ABNORMAL HIGH (ref 70–99)
Glucose-Capillary: 194 mg/dL — ABNORMAL HIGH (ref 70–99)

## 2014-07-18 LAB — I-STAT CG4 LACTIC ACID, ED
Lactic Acid, Venous: 2.9 mmol/L (ref 0.5–2.0)
Lactic Acid, Venous: 3.22 mmol/L (ref 0.5–2.0)

## 2014-07-18 LAB — COMPREHENSIVE METABOLIC PANEL
ALBUMIN: 2.8 g/dL — AB (ref 3.5–5.2)
ALK PHOS: 117 U/L (ref 39–117)
ALT: 75 U/L — ABNORMAL HIGH (ref 0–35)
AST: 55 U/L — AB (ref 0–37)
BILIRUBIN TOTAL: 0.3 mg/dL (ref 0.3–1.2)
BUN: 74 mg/dL — AB (ref 6–23)
CO2: 26 mmol/L (ref 19–32)
Calcium: 9.4 mg/dL (ref 8.4–10.5)
Creatinine, Ser: 1.66 mg/dL — ABNORMAL HIGH (ref 0.50–1.10)
GFR calc Af Amer: 34 mL/min — ABNORMAL LOW (ref >90)
GFR calc non Af Amer: 30 mL/min — ABNORMAL LOW (ref >90)
GLUCOSE: 191 mg/dL — AB (ref 70–99)
POTASSIUM: 3.7 mmol/L (ref 3.5–5.1)
Sodium: >180 mmol/L (ref 135–145)
Total Protein: 6.5 g/dL (ref 6.0–8.3)

## 2014-07-18 LAB — MAGNESIUM: MAGNESIUM: 2.5 mg/dL (ref 1.5–2.5)

## 2014-07-18 LAB — I-STAT TROPONIN, ED: Troponin i, poc: 0.02 ng/mL (ref 0.00–0.08)

## 2014-07-18 LAB — CREATININE, SERUM
Creatinine, Ser: 1.52 mg/dL — ABNORMAL HIGH (ref 0.50–1.10)
GFR calc Af Amer: 38 mL/min — ABNORMAL LOW (ref >90)
GFR calc non Af Amer: 33 mL/min — ABNORMAL LOW (ref >90)

## 2014-07-18 LAB — MRSA PCR SCREENING: MRSA BY PCR: NEGATIVE

## 2014-07-18 LAB — TSH: TSH: 1.524 u[IU]/mL (ref 0.350–4.500)

## 2014-07-18 LAB — TROPONIN I: Troponin I: 0.05 ng/mL — ABNORMAL HIGH (ref <0.031)

## 2014-07-18 MED ORDER — DEXTROSE 50 % IV SOLN
INTRAVENOUS | Status: AC
  Administered 2014-07-18: 50 mL via INTRAVENOUS
  Filled 2014-07-18: qty 50

## 2014-07-18 MED ORDER — SODIUM CHLORIDE 0.9 % IJ SOLN: 3.0000 mL | Freq: Two times a day (BID) | INTRAMUSCULAR | Status: DC

## 2014-07-18 MED ORDER — PIPERACILLIN-TAZOBACTAM 3.375 G IVPB: 3.3750 g | Freq: Three times a day (TID) | INTRAVENOUS | Status: DC

## 2014-07-18 MED ORDER — DEXTROSE 50 % IV SOLN
1.0000 | Freq: Once | INTRAVENOUS | Status: AC
  Administered 2014-07-18: 50 mL via INTRAVENOUS

## 2014-07-18 MED ORDER — ACETAMINOPHEN 325 MG PO TABS: 650.0000 mg | ORAL_TABLET | Freq: Four times a day (QID) | ORAL | Status: DC | PRN

## 2014-07-18 MED ORDER — LEVALBUTEROL HCL 0.63 MG/3ML IN NEBU
0.6300 mg | INHALATION_SOLUTION | Freq: Four times a day (QID) | RESPIRATORY_TRACT | Status: DC
  Administered 2014-07-18 – 2014-07-19 (×4): 0.63 mg via RESPIRATORY_TRACT
  Filled 2014-07-18 (×10): qty 3

## 2014-07-18 MED ORDER — SODIUM CHLORIDE 0.9 % IV BOLUS (SEPSIS)
250.0000 mL | Freq: Once | INTRAVENOUS | Status: AC
  Administered 2014-07-18: 250 mL via INTRAVENOUS

## 2014-07-18 MED ORDER — HEPARIN SODIUM (PORCINE) 5000 UNIT/ML IJ SOLN
5000.0000 [IU] | Freq: Three times a day (TID) | INTRAMUSCULAR | Status: DC
  Administered 2014-07-18 (×2): 5000 [IU] via SUBCUTANEOUS
  Filled 2014-07-18 (×4): qty 1

## 2014-07-18 MED ORDER — ONDANSETRON HCL 4 MG PO TABS: 4.0000 mg | ORAL_TABLET | Freq: Four times a day (QID) | ORAL | Status: DC | PRN

## 2014-07-18 MED ORDER — ONDANSETRON HCL 4 MG/2ML IJ SOLN: 4.0000 mg | Freq: Four times a day (QID) | INTRAMUSCULAR | Status: DC | PRN

## 2014-07-18 MED ORDER — DEXTROSE-NACL 5-0.45 % IV SOLN
INTRAVENOUS | Status: DC
  Administered 2014-07-18 – 2014-07-19 (×2): via INTRAVENOUS

## 2014-07-18 MED ORDER — SODIUM CHLORIDE 0.9 % IV BOLUS (SEPSIS)
1000.0000 mL | Freq: Once | INTRAVENOUS | Status: AC
  Administered 2014-07-18: 1000 mL via INTRAVENOUS

## 2014-07-18 MED ORDER — ACETAMINOPHEN 650 MG RE SUPP: 650.0000 mg | Freq: Four times a day (QID) | RECTAL | Status: DC | PRN

## 2014-07-18 MED ORDER — PIPERACILLIN-TAZOBACTAM 3.375 G IVPB 30 MIN
3.3750 g | Freq: Once | INTRAVENOUS | Status: AC
  Administered 2014-07-18: 3.375 g via INTRAVENOUS
  Filled 2014-07-18: qty 50

## 2014-07-18 MED ORDER — DEXTROSE 5 % IV SOLN
1.0000 g | Freq: Once | INTRAVENOUS | Status: AC
  Administered 2014-07-18: 1 g via INTRAVENOUS
  Filled 2014-07-18: qty 10

## 2014-07-18 MED ORDER — DEXTROSE-NACL 5-0.45 % IV SOLN
INTRAVENOUS | Status: DC
  Administered 2014-07-18: 15:00:00 via INTRAVENOUS

## 2014-07-18 MED ORDER — DEXTROSE 50 % IV SOLN
INTRAVENOUS | Status: AC
  Filled 2014-07-18: qty 50

## 2014-07-18 MED ORDER — PIPERACILLIN-TAZOBACTAM 3.375 G IVPB
3.3750 g | Freq: Three times a day (TID) | INTRAVENOUS | Status: DC
  Administered 2014-07-19 (×2): 3.375 g via INTRAVENOUS
  Filled 2014-07-18 (×4): qty 50

## 2014-07-18 NOTE — ED Notes (Signed)
CG-4 reported to Dr. Vivianne Spence

## 2014-07-18 NOTE — H&P (Addendum)
[facility] History and Physical  [name] IOE:[id number] DOB: Sep 24, 1940 DOA: 07/18/2014  Referring physician:  PCP: Unice Cobble, MD   Chief Complaint: Altered mental status  HPI:  74 year old right-handed black female with a history of a progressive dementia secondary to Alzheimer's disease. The patient has continued to progress, she is nonambulatory at this point, she is nonverbal. The patient will eat fairly well, but she requires assistance with all activities of daily living including feeding. The patient will pocket food in her mouth. The pills need to be crushed and placed with food. The patient is sleeping most of the day and night. She will occasionally have some reactive aggression. She will occasionally have Seroquel to take if needed. The patient is married, and her husband appears to have early dementia, cannot provide me with any history.  The patient remains on Namenda. Recently seen by Dr. Jannifer Franklin, [facility] neurology. Patient's son Legrand Como was agreeable to signing a DO NOT RESUSCITATE form at neurology office on 3/11. Verified with the son today. He would like to continue with a DO NOT RESUSCITATE during this admission. Upon arrival to the ED today the patient was found to be hypoglycemic. CBG was 25. After receiving D50 CBG improved 30. Subsequently started on a dextrose drip and CBG was found to be greater than 300. Multiple electrolyte abnormalities, sodium unmeasurable, creatinine 1.66. Lactic acid 2.9. Apparently the patient has been steadily declining over the last several months.      Review of Systems: negative for the following  Unable to obtain review of systems because of advanced dementia Has been unable to provide me with any history     Past Medical History  Diagnosis Date  . Dysphagia   . Diabetes mellitus   . Alzheimer disease   . Hypokalemia   . Anemia   . 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 DVT (deep venous thrombosis)     Coumadin  Clinic  . Hypertension   . Abnormality of gait 06/13/2014     Past Surgical History  Procedure Laterality Date  . Syncope  05/2001    pacer  . Pacemaker placement        Social History:  reports that she has never smoked. She does not have any smokeless tobacco history on file. She reports that she does not drink alcohol or use illicit drugs.    Allergies  Allergen Reactions  . Ace Inhibitors Cough    Family History  Problem Relation Age of Onset  . Alzheimer's disease    . Diabetes    . Stroke Father   . Diabetes Brother   . Seizures Brother   . Diabetes Brother         Prior to Admission medications   Medication Sig Start Date End Date Taking? Authorizing Provider  Blood Glucose Monitoring Suppl (ONE TOUCH ULTRA 2) W/DEVICE KIT Use to test blood sugars before each meal and at bedtime so four times daily ICD 10 E11 59 06/09/14   Hendricks Limes, MD  cephALEXin (KEFLEX) 500 MG capsule Take 1 capsule (500 mg total) by mouth 3 (three) times daily. 03/08/14   Merrily Pew, MD  glucose blood test strip ONETOUCH ULTRA ULTRA TEST STRIPS Use to test blood sugar before each meal and at bedtime so 4 times daily ICD 10 E11 59 06/09/14   Hendricks Limes, MD  Insulin Detemir (LEVEMIR FLEXPEN) 100 UNIT/ML Pen Inject 18 Units into the skin every evening. 15 units each evening 03/13/14  Hendricks Limes, MD  insulin lispro (HUMALOG) 100 UNIT/ML injection Inject into the skin 3 (three) times daily before meals.    Historical Provider, MD  memantine (NAMENDA) 10 MG tablet Take 1 tablet (10 mg total) by mouth 2 (two) times daily. 05/12/14   Hendricks Limes, MD  [facility] DELICA LANCETS 01S MISC Use to test blood sugar before each meal and at bedtime so four times dialy ICD 10 E11 59 06/09/14   Hendricks Limes, MD  potassium chloride SA (K-DUR,KLOR-CON) 20 MEQ tablet Take 1 tablet (20 mEq total) by mouth 2 (two) times daily. 01/31/14   Hendricks Limes, MD  pravastatin (PRAVACHOL) 40 MG tablet  Take 40 mg by mouth daily.    Historical Provider, MD  QUEtiapine (SEROQUEL) 50 MG tablet TAKE 1 TABLET (50 MG TOTAL) BY MOUTH AT BEDTIME. 06/09/14   Hendricks Limes, MD  warfarin (COUMADIN) 4 MG tablet TAKE 1 TABLET (4 MG TOTAL) BY MOUTH AS DIRECTED. 06/16/14   Evans Lance, MD     Physical Exam: Filed Vitals:   07/18/14 1547 07/18/14 1645 07/18/14 1700 07/18/14 1715  BP: 147/87 145/84 142/78 135/86  Pulse: 84 79 77 79  Temp:      TempSrc:      Resp: $Remo'20 19 19 19  'NIaLG$ SpO2: 100% 100% 100% 100%     Constitutional: Vital signs reviewed. Chronically ill-appearing, dehydrated, not responding to sternal rub Head: Normocephalic and atraumatic  Ear: TM normal bilaterally  Mouth: no erythema or exudates, MMM  Eyes: PERRL, EOMI, conjunctivae normal, No scleral icterus.  Neck: Supple, Trachea midline normal ROM, No JVD, mass, thyromegaly, or carotid bruit present.  Cardiovascular: RRR, S1 normal, S2 normal, no MRG, pulses symmetric and intact bilaterally  Pulmonary/Chest: CTAB, no wheezes, rales, or rhonchi  Abdominal: Soft. Non-tender, non-distended, bowel sounds are normal, no masses, organomegaly, or guarding present.  GU: no CVA tenderness Musculoskeletal: No joint deformities, erythema, or stiffness, ROM full and no nontender Ext: no edema and no cyanosis, pulses palpable bilaterally (DP and PT)  Hematology: no cervical, inginal, or axillary adenopathy.  Mental status: The patient is sleepy and nonverbal, unable to participate for Mini-Mental Status Examination today.   Cranial nerves: Facial symmetry is present. The patient is sitting in a wheelchair, will not open her eyes, nonverbal.  Motor: The patient has good strength in all 4 extremities. The patient has gegenhalten rigidity on all 4 extremities, will not cooperate for examination.  Sensory examination: The patient has some response to pain stimulation on all fours.  Coordination: The patient is unable to cooperate for cerebellar  testing.  Skin: Warm, dry and intact. No rash, cyanosis, or clubbing.  Psychiatric: Normal mood and affect. speech and behavior is normal. Judgment and thought content normal. Cognition and memory are normal.       Labs on Admission:    Basic Metabolic Panel:  Recent Labs Lab 07/18/14 1511  NA >180*  K 3.7  CL >130*  CO2 26  GLUCOSE 191*  BUN 74*  CREATININE 1.66*  CALCIUM 9.4   Liver Function Tests:  Recent Labs Lab 07/18/14 1511  AST 55*  ALT 75*  ALKPHOS 117  BILITOT 0.3  PROT 6.5  ALBUMIN 2.8*   No results for input(s): LIPASE, AMYLASE in the last 168 hours. No results for input(s): AMMONIA in the last 168 hours. CBC:  Recent Labs Lab 07/18/14 1511  WBC 7.1  NEUTROABS 4.3  HGB 12.3  HCT 42.0  MCV 97.2  PLT 138*   Cardiac Enzymes: No results for input(s): CKTOTAL, CKMB, CKMBINDEX, TROPONINI in the last 168 hours.  BNP (last 3 results) No results for input(s): BNP in the last 8760 hours.  ProBNP (last 3 results) No results for input(s): PROBNP in the last 8760 hours.     CBG:  Recent Labs Lab 07/18/14 1452 07/18/14 1515 07/18/14 1543 07/18/14 1654  GLUCAP 25* 30* 379* 241*    Radiological Exams on Admission: Dg Chest Portable 1 View  07/18/2014   CLINICAL DATA:  Unresponsive.  Hypoglycemic.  EXAM: PORTABLE CHEST - 1 VIEW  COMPARISON:  03/08/2014  FINDINGS: The cardiac silhouette, mediastinal and hilar contours are within normal limits and stable. There is tortuosity and calcification of the thoracic aorta. The pacer wires are stable. Vague upper lobe airspace opacities are noted bilaterally, right greater than left. Could not exclude infiltrates. No pleural effusion or pneumothorax.  IMPRESSION: Upper lobe airspace opacities bilaterally suspicious for infiltrates. No edema or effusions.   Electronically Signed   By: Marijo Sanes M.D.   On: 07/18/2014 15:29    EKG: Independently reviewed. Assessment/Plan Active Problems:    Hypernatremia   Altered mental state  Toxic/metabolic encephalopathy in the setting of dementia Possible UTI, aspiration pneumonia, hypernatremia, hypoglycemia Correct underlying causes  Hypernatremia Secondary to dehydration Hydrate the patient with normal saline and subsequently switched to half normal saline with D5  Hypoglycemia, with labile CBG, history of diabetes Maintain patient on a D5 drip, check CBG every 2 hours Hold insulin  Sepsis secondary to UTI/pneumonia Start the patient on vancomycin and Zosyn per pharmacy Blood cultures 2 Urine culture   Lactic acidosis/metabolic acidosis secondary to the above as well as severe dehydration  History of DVT on Coumadin Check INR     Code Status:   DNR Family Communication: bedside Disposition Plan: admit   Time spent: 70 mins   [facility] Pager [phone]  If 7PM-7AM, please contact night-coverage [email or web address] Password [facility] 07/18/2014, 5:38 PM

## 2014-07-18 NOTE — ED Notes (Signed)
cG-4 of 3.22 reported to Dr. Christy Gentles

## 2014-07-18 NOTE — ED Notes (Signed)
Pt unresponsive, pulled from car with family member. CBG 25.

## 2014-07-18 NOTE — ED Provider Notes (Signed)
CSN: [id number]     Arrival date & time 07/18/14  1446 History   First MD Initiated Contact with Patient 07/18/14 1505     Chief Complaint  Patient presents with  . Hypoglycemia     (Consider location/radiation/quality/duration/timing/severity/associated sxs/prior Treatment) Patient is a 74 y.o. female presenting with altered mental status. The history is provided by a relative. The history is limited by the condition of the patient. No language interpreter was used.  Altered Mental Status Presenting symptoms: confusion (severe dementia), lethargy and unresponsiveness   Severity:  Severe Most recent episode:  Today Episode history:  Single Timing:  Constant Progression:  Waxing and waning Chronicity:  New Context: dementia   Context: not head injury, taking medications as prescribed, not a recent change in medication, not a recent illness and not a recent infection   Associated symptoms: decreased appetite   Associated symptoms: normal movement, no fever, no rash, no seizures and no vomiting     Past Medical History  Diagnosis Date  . Dysphagia   . Diabetes mellitus   . Alzheimer disease   . Hypokalemia   . Anemia   . 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 DVT (deep venous thrombosis)     Coumadin Clinic  . Hypertension   . Abnormality of gait 06/13/2014   Past Surgical History  Procedure Laterality Date  . Syncope  05/2001    pacer  . Pacemaker placement     Family History  Problem Relation Age of Onset  . Alzheimer's disease    . Diabetes    . Stroke Father   . Diabetes Brother   . Seizures Brother   . Diabetes Brother    History  Substance Use Topics  . Smoking status: Never Smoker   . Smokeless tobacco: Not on file     Comment: Patient exposed to second hand smoke daily   . Alcohol Use: No   OB History    No data available     Review of Systems  Unable to perform ROS: Dementia  Constitutional: Positive for decreased appetite. Negative for fever.   Gastrointestinal: Negative for vomiting and diarrhea.  Skin: Negative for rash.  Neurological: Negative for seizures and facial asymmetry.  Psychiatric/Behavioral: Positive for confusion (severe dementia).      Allergies  Ace inhibitors  Home Medications   Prior to Admission medications   Medication Sig Start Date End Date Taking? Authorizing Provider  Blood Glucose Monitoring Suppl (ONE TOUCH ULTRA 2) W/DEVICE KIT Use to test blood sugars before each meal and at bedtime so four times daily ICD 10 E11 59 06/09/14   Hendricks Limes, MD  cephALEXin (KEFLEX) 500 MG capsule Take 1 capsule (500 mg total) by mouth 3 (three) times daily. 03/08/14   Merrily Pew, MD  glucose blood test strip ONETOUCH ULTRA ULTRA TEST STRIPS Use to test blood sugar before each meal and at bedtime so 4 times daily ICD 10 E11 59 06/09/14   Hendricks Limes, MD  Insulin Detemir (LEVEMIR FLEXPEN) 100 UNIT/ML Pen Inject 18 Units into the skin every evening. 15 units each evening 03/13/14   Hendricks Limes, MD  insulin lispro (HUMALOG) 100 UNIT/ML injection Inject into the skin 3 (three) times daily before meals.    Historical Provider, MD  memantine (NAMENDA) 10 MG tablet Take 1 tablet (10 mg total) by mouth 2 (two) times daily. 05/12/14   Hendricks Limes, MD  [facility] DELICA LANCETS 78G MISC Use to test blood sugar  before each meal and at bedtime so four times dialy ICD 10 E11 59 06/09/14   Hendricks Limes, MD  potassium chloride SA (K-DUR,KLOR-CON) 20 MEQ tablet Take 1 tablet (20 mEq total) by mouth 2 (two) times daily. 01/31/14   Hendricks Limes, MD  pravastatin (PRAVACHOL) 40 MG tablet Take 40 mg by mouth daily.    Historical Provider, MD  QUEtiapine (SEROQUEL) 50 MG tablet TAKE 1 TABLET (50 MG TOTAL) BY MOUTH AT BEDTIME. 06/09/14   Hendricks Limes, MD  warfarin (COUMADIN) 4 MG tablet TAKE 1 TABLET (4 MG TOTAL) BY MOUTH AS DIRECTED. 06/16/14   Evans Lance, MD   BP 145/84 mmHg  Pulse 79  Temp(Src) 97.2 F (36.2 C)  (Temporal)  Resp 19  SpO2 100% Physical Exam  Constitutional: Vital signs are normal. She appears well-developed and well-nourished. She appears toxic. No distress.  HENT:  Head: Normocephalic and atraumatic.  Nose: Nose normal.  Mouth/Throat: Oropharynx is clear and moist. No oropharyngeal exudate.  Eyes: EOM are normal. Pupils are equal, round, and reactive to light.  Neck: Normal range of motion. Neck supple.  Cardiovascular: Normal rate, regular rhythm, normal heart sounds and intact distal pulses.   No murmur heard. Pulmonary/Chest: Effort normal and breath sounds normal. No respiratory distress. She has no wheezes. She exhibits no tenderness.  Abdominal: Soft. There is no tenderness. There is no rebound and no guarding.  Musculoskeletal: Normal range of motion. She exhibits no tenderness.  Lymphadenopathy:    She has no cervical adenopathy.  Neurological: She is unresponsive. GCS eye subscore is 1. GCS verbal subscore is 1. GCS motor subscore is 1.  Skin: Skin is warm and dry. She is not diaphoretic.  Nursing note and vitals reviewed.   ED Course  Procedures (including critical care time) Labs Review Labs Reviewed  CBC WITH DIFFERENTIAL/PLATELET - Abnormal; Notable for the following:    MCHC 29.3 (*)    RDW 16.8 (*)    Platelets 138 (*)    All other components within normal limits  COMPREHENSIVE METABOLIC PANEL - Abnormal; Notable for the following:    Sodium >180 (*)    Chloride >130 (*)    Glucose, Bld 191 (*)    BUN 74 (*)    Creatinine, Ser 1.66 (*)    Albumin 2.8 (*)    AST 55 (*)    ALT 75 (*)    GFR calc non Af Amer 30 (*)    GFR calc Af Amer 34 (*)    All other components within normal limits  URINALYSIS, ROUTINE W REFLEX MICROSCOPIC - Abnormal; Notable for the following:    Color, Urine GREEN (*)    APPearance TURBID (*)    Hgb urine dipstick MODERATE (*)    Ketones, ur 15 (*)    Protein, ur >300 (*)    Leukocytes, UA LARGE (*)    All other components  within normal limits  URINE MICROSCOPIC-ADD ON - Abnormal; Notable for the following:    Squamous Epithelial / LPF FEW (*)    Bacteria, UA MANY (*)    Crystals TRIPLE PHOSPHATE CRYSTALS (*)    All other components within normal limits  I-STAT CG4 LACTIC ACID, ED - Abnormal; Notable for the following:    Lactic Acid, Venous 2.90 (*)    All other components within normal limits  I-STAT VENOUS BLOOD GAS, ED - Abnormal; Notable for the following:    pCO2, Ven 51.9 (*)    pO2, Ven  21.0 (*)    Bicarbonate 25.1 (*)    All other components within normal limits  CBG MONITORING, ED - Abnormal; Notable for the following:    Glucose-Capillary 30 (*)    All other components within normal limits  CBG MONITORING, ED - Abnormal; Notable for the following:    Glucose-Capillary 25 (*)    All other components within normal limits  CBG MONITORING, ED - Abnormal; Notable for the following:    Glucose-Capillary 379 (*)    All other components within normal limits  CBG MONITORING, ED - Abnormal; Notable for the following:    Glucose-Capillary 241 (*)    All other components within normal limits  URINE CULTURE  PROTIME-INR  I-STAT TROPOININ, ED  I-STAT CHEM 8, ED    Imaging Review Dg Chest Portable 1 View  07/18/2014   CLINICAL DATA:  Unresponsive.  Hypoglycemic.  EXAM: PORTABLE CHEST - 1 VIEW  COMPARISON:  03/08/2014  FINDINGS: The cardiac silhouette, mediastinal and hilar contours are within normal limits and stable. There is tortuosity and calcification of the thoracic aorta. The pacer wires are stable. Vague upper lobe airspace opacities are noted bilaterally, right greater than left. Could not exclude infiltrates. No pleural effusion or pneumothorax.  IMPRESSION: Upper lobe airspace opacities bilaterally suspicious for infiltrates. No edema or effusions.   Electronically Signed   By: Marijo Sanes M.D.   On: 07/18/2014 15:29     EKG Interpretation   Date/Time:  Friday July 18 2014 14:58:08  EDT Ventricular Rate:  92 PR Interval:  163 QRS Duration: 92 QT Interval:  462 QTC Calculation: 572 R Axis:   59 Text Interpretation:  Sinus rhythm Anteroseptal infarct, old Nonspecific T  abnormalities, inferior leads Prolonged QT interval Baseline wander in  lead(s) V4 Artifact Confirmed by Christy Gentles  MD, Elenore Rota ([id number]) on 07/18/2014  3:06:03 PM      MDM   Final diagnoses:  Hypoglycemia  Hypernatremia  Hyperchloremia  Somnolence  AKI (acute kidney injury)  UTI (lower urinary tract infection)  History of dementia   Pt is a 74 yo F with hx of Alzheimers, DM, Afib (on coumadin), bradycardia with pacer, who presents with somnolence for 1 day.  Brought by son to the ED via personal vehicle.  No recent med changes, illnesses, falls.  Decreased energy level yesterday and somnolence today.  Has hx of DM and takes insulin.  Normally takes 18 units q PM but was only given 10 units last night when her glucose was 180 before bed.  No insulin today because patient didn't want to eat.   Lives with son/daughter in law and they have a CNA when they are at work so pt gets 24 hr care.    Not responsive for me on arrival but RN reports she was seen opening her eyes to verbal command moments previously.  Vitals stable on arrival, bilateral breath sounds and good peripheral pulses.    No extremity movement away from painful stimuli.    Found to have glucose of 25.   Given D50 and her alertness improved significantly.  Now noted to be moving all extremities.   Repeat glucose at 1515 was 30.   Will give a 2nd dose of D50 and start her on D5 1/2 NS at 150 cc/hr as she has limited improvement after the first bolus.   Second repeat glucose was up to 379 at 1545.  The infusion was then changed from D5 1/2NS to NS bolus.    Lactate 2.9.  CXR looks relatively benign, but was read as bilateral upper lobe opacities.  Urine shows signs of infection.  Given IV rocephin for coverage.   CMP returned  significantly abnormal.  Na > 180, Cl > 130, but K appears normal (3.7).  Cr 1.66, up from baseline of <1.  No recent labs to compare to in the last 4 months.   To check for lab error, an iStat chem 8 was obtained and run several times in the ED lab, but returned as an error on multiple different machines due to values being so abnormal.  This leads me to believe that the initial CMP readings were correct.  Patient has a significant metabolic derangement.   Will need admission.  Patient sees Dr. Linna Darner at [facility] internal medicine clinic, so she will be triaged to the hospitalists team.   Spoke to Admitting physician Dr. Allyson Sabal at [phone].  Will place bed for step down service.    Patient was seen with ED Attending, Dr. Estelle Grumbles, MD   Tori Milks, MD 07/19/14 Bowers, MD 07/20/14 [phone]

## 2014-07-18 NOTE — ED Notes (Signed)
CBG of 25. Reported to RN and MD.

## 2014-07-18 NOTE — Progress Notes (Signed)
ANTIBIOTIC CONSULT NOTE - INITIAL  Pharmacy Consult for Zosyn Indication: aspiration pneumonia  Allergies  Allergen Reactions  . Ace Inhibitors Cough    Patient Measurements:   Vital Signs: Temp: 97.2 F (36.2 C) (04/15 1502) Temp Source: Temporal (04/15 1502) BP: 135/86 mmHg (04/15 1715) Pulse Rate: 79 (04/15 1715) Intake/Output from previous day:   Intake/Output from this shift:    Labs:  Recent Labs  07/18/14 1511  WBC 7.1  HGB 12.3  PLT 138*  CREATININE 1.66*   CrCl cannot be calculated (Unknown ideal weight.). No results for input(s): VANCOTROUGH, VANCOPEAK, VANCORANDOM, GENTTROUGH, GENTPEAK, GENTRANDOM, TOBRATROUGH, TOBRAPEAK, TOBRARND, AMIKACINPEAK, AMIKACINTROU, AMIKACIN in the last 72 hours.   Microbiology: No results found for this or any previous visit (from the past 720 hour(s)).  Medical History: Past Medical History  Diagnosis Date  . Dysphagia   . Diabetes mellitus   . Alzheimer disease   . Hypokalemia   . Anemia   . 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 DVT (deep venous thrombosis)     Coumadin Clinic  . Hypertension   . Abnormality of gait 06/13/2014    Medications:  Anti-infectives    Start     Dose/Rate Route Frequency Ordered Stop   07/19/14 0100  piperacillin-tazobactam (ZOSYN) IVPB 3.375 g     3.375 g 12.5 mL/hr over 240 Minutes Intravenous Every 8 hours 07/18/14 1740     07/18/14 1800  piperacillin-tazobactam (ZOSYN) IVPB 3.375 g  Status:  Discontinued     3.375 g 12.5 mL/hr over 240 Minutes Intravenous Every 8 hours 07/18/14 1730 07/18/14 1740   07/18/14 1745  piperacillin-tazobactam (ZOSYN) IVPB 3.375 g     3.375 g 100 mL/hr over 30 Minutes Intravenous  Once 07/18/14 1739     07/18/14 1545  cefTRIAXone (ROCEPHIN) 1 g in dextrose 5 % 50 mL IVPB     1 g 100 mL/hr over 30 Minutes Intravenous  Once 07/18/14 1539 07/18/14 1718     Assessment: 31 yoF who was found unresponsive and hypoglycemic. UA shows signs of infection and  Rocephin x1 given. Pharmacy consulted to dose Zosyn for aspiration pneumonia. Afebrile, LA 2.9, SCr 1.66 (BL ~0.9), CrCl 30-35 ml/min  4/15 UA >> green, neg nitrites, large leukocytes, many bacteria 4/15 UCx >>  CTX 4/15 x1 Zosyn 4/15 >>  Goal of Therapy:  Clinical resolution of infection  Plan:  Zosyn 3.375 g IV load, then 3.375 g IV q8h Monitor renal function Follow up C&S, clinical progress  Jeanelle Malling 07/18/2014,5:41 PM

## 2014-07-18 NOTE — ED Notes (Signed)
MD REPORTING NO NEED FOR BLOOD CULTURES. WILL DISCONTINUE.

## 2014-07-18 NOTE — ED Provider Notes (Signed)
Patient seen/examined in the Emergency Department in conjunction with Resident Physician Provider [facility] Patient presents for increased altered mental status.  Pt noted to be hypoglycemic Exam : pt is somnolent but arousable.  She is in no distress at this time Plan: will need admission for persistent hypoglycemia (pt had CBG of 25, after given D50 only improved to 30 and now on dextrose drip)    Ripley Fraise, MD 07/18/14 1600

## 2014-07-18 NOTE — ED Notes (Signed)
Test returned a bad sample result on the Chem 8.  Blood will have to be redrawn attain complete results

## 2014-07-19 DIAGNOSIS — E870 Hyperosmolality and hypernatremia: Secondary | ICD-10-CM

## 2014-07-19 DIAGNOSIS — E860 Dehydration: Secondary | ICD-10-CM

## 2014-07-19 DIAGNOSIS — R410 Disorientation, unspecified: Secondary | ICD-10-CM

## 2014-07-19 DIAGNOSIS — F0280 Dementia in other diseases classified elsewhere without behavioral disturbance: Secondary | ICD-10-CM

## 2014-07-19 DIAGNOSIS — G309 Alzheimer's disease, unspecified: Principal | ICD-10-CM

## 2014-07-19 LAB — URINE CULTURE
Colony Count: >=100000
SPECIAL REQUESTS: NORMAL

## 2014-07-19 LAB — COMPREHENSIVE METABOLIC PANEL
ALT: 72 U/L — ABNORMAL HIGH (ref 0–35)
AST: 53 U/L — AB (ref 0–37)
Albumin: 2.5 g/dL — ABNORMAL LOW (ref 3.5–5.2)
Alkaline Phosphatase: 108 U/L (ref 39–117)
BILIRUBIN TOTAL: 0.6 mg/dL (ref 0.3–1.2)
BUN: 59 mg/dL — ABNORMAL HIGH (ref 6–23)
CALCIUM: 8.6 mg/dL (ref 8.4–10.5)
CO2: 24 mmol/L (ref 19–32)
Chloride: >130 mmol/L (ref 96–112)
Creatinine, Ser: 1.38 mg/dL — ABNORMAL HIGH (ref 0.50–1.10)
GFR calc Af Amer: 43 mL/min — ABNORMAL LOW (ref >90)
GFR, EST NON AFRICAN AMERICAN: 37 mL/min — AB (ref >90)
GLUCOSE: 211 mg/dL — AB (ref 70–99)
Potassium: 3.6 mmol/L (ref 3.5–5.1)
Sodium: 179 mmol/L (ref 135–145)
Total Protein: 6 g/dL (ref 6.0–8.3)

## 2014-07-19 LAB — GLUCOSE, CAPILLARY
GLUCOSE-CAPILLARY: 189 mg/dL — AB (ref 70–99)
GLUCOSE-CAPILLARY: 205 mg/dL — AB (ref 70–99)
GLUCOSE-CAPILLARY: 267 mg/dL — AB (ref 70–99)
GLUCOSE-CAPILLARY: 300 mg/dL — AB (ref 70–99)
GLUCOSE-CAPILLARY: 250 mg/dL — AB (ref 70–99)
GLUCOSE-CAPILLARY: 273 mg/dL — AB (ref 70–99)
Glucose-Capillary: 153 mg/dL — ABNORMAL HIGH (ref 70–99)
Glucose-Capillary: 186 mg/dL — ABNORMAL HIGH (ref 70–99)
Glucose-Capillary: 253 mg/dL — ABNORMAL HIGH (ref 70–99)
Glucose-Capillary: 266 mg/dL — ABNORMAL HIGH (ref 70–99)

## 2014-07-19 LAB — CBC
HEMATOCRIT: 39.6 % (ref 36.0–46.0)
Hemoglobin: 10.9 g/dL — ABNORMAL LOW (ref 12.0–15.0)
MCH: 27.5 pg (ref 26.0–34.0)
MCHC: 27.5 g/dL — ABNORMAL LOW (ref 30.0–36.0)
MCV: 100 fL (ref 78.0–100.0)
PLATELETS: 99 10*3/uL — AB (ref 150–400)
RBC: 3.96 MIL/uL (ref 3.87–5.11)
RDW: 16.8 % — ABNORMAL HIGH (ref 11.5–15.5)
WBC: 6.9 10*3/uL (ref 4.0–10.5)

## 2014-07-19 LAB — BASIC METABOLIC PANEL
BUN: 52 mg/dL — AB (ref 6–23)
CO2: 24 mmol/L (ref 19–32)
CREATININE: 1.53 mg/dL — AB (ref 0.50–1.10)
Calcium: 8.1 mg/dL — ABNORMAL LOW (ref 8.4–10.5)
Chloride: >130 mmol/L (ref 96–112)
GFR calc Af Amer: 38 mL/min — ABNORMAL LOW (ref >90)
GFR, EST NON AFRICAN AMERICAN: 33 mL/min — AB (ref >90)
Glucose, Bld: 328 mg/dL — ABNORMAL HIGH (ref 70–99)
Potassium: 3.3 mmol/L — ABNORMAL LOW (ref 3.5–5.1)
SODIUM: 175 mmol/L — AB (ref 135–145)

## 2014-07-19 LAB — TROPONIN I
TROPONIN I: 0.05 ng/mL — AB (ref <0.031)
Troponin I: 0.06 ng/mL — ABNORMAL HIGH (ref <0.031)

## 2014-07-19 MED ORDER — FLUCONAZOLE 150 MG PO TABS: 150.0000 mg | ORAL_TABLET | Freq: Once | ORAL | Status: DC

## 2014-07-19 MED ORDER — CEFTRIAXONE SODIUM IN DEXTROSE 20 MG/ML IV SOLN
1.0000 g | INTRAVENOUS | Status: DC
  Administered 2014-07-19 – 2014-07-22 (×4): 1 g via INTRAVENOUS
  Filled 2014-07-19 (×5): qty 50

## 2014-07-19 MED ORDER — LEVALBUTEROL HCL 0.63 MG/3ML IN NEBU: 0.6300 mg | INHALATION_SOLUTION | RESPIRATORY_TRACT | Status: DC | PRN

## 2014-07-19 MED ORDER — LEVALBUTEROL HCL 0.63 MG/3ML IN NEBU: 0.6300 mg | INHALATION_SOLUTION | Freq: Three times a day (TID) | RESPIRATORY_TRACT | Status: DC

## 2014-07-19 MED ORDER — CHLORHEXIDINE GLUCONATE 0.12 % MT SOLN
15.0000 mL | Freq: Two times a day (BID) | OROMUCOSAL | Status: DC
  Administered 2014-07-19 – 2014-07-25 (×13): 15 mL via OROMUCOSAL
  Filled 2014-07-19 (×15): qty 15

## 2014-07-19 MED ORDER — CETYLPYRIDINIUM CHLORIDE 0.05 % MT LIQD
7.0000 mL | Freq: Two times a day (BID) | OROMUCOSAL | Status: DC
  Administered 2014-07-19 – 2014-07-25 (×13): 7 mL via OROMUCOSAL

## 2014-07-19 MED ORDER — WARFARIN - PHARMACIST DOSING INPATIENT
Freq: Every day | Status: DC
  Administered 2014-07-19 – 2014-07-22 (×2)

## 2014-07-19 MED ORDER — NYSTATIN 100000 UNIT/GM EX POWD
Freq: Two times a day (BID) | CUTANEOUS | Status: DC
  Administered 2014-07-19 – 2014-07-25 (×13): via TOPICAL
  Filled 2014-07-19 (×2): qty 15

## 2014-07-19 MED ORDER — WARFARIN SODIUM 6 MG PO TABS
6.0000 mg | ORAL_TABLET | Freq: Once | ORAL | Status: AC
Start: 2014-07-19 — End: 2014-07-19
  Administered 2014-07-19: 6 mg via ORAL
  Filled 2014-07-19: qty 1

## 2014-07-19 NOTE — Progress Notes (Signed)
ANTICOAGULATION CONSULT NOTE - Initial Consult  Pharmacy Consult for Warfarin Indication: Hx DVT  Allergies  Allergen Reactions  . Ace Inhibitors Cough    Patient Measurements: Height: 5\' 7"  (170.2 cm) Weight: 117 lb 8.1 oz (53.3 kg) IBW/kg (Calculated) : 61.6  Vital Signs: Temp: 98.2 F (36.8 C) (04/16 1206) Temp Source: Axillary (04/16 1206) BP: 114/66 mmHg (04/16 1206) Pulse Rate: 81 (04/16 1206)  Labs:  Recent Labs  07/18/14 1511 07/18/14 1830 07/18/14 2334 07/19/14 0241  HGB 12.3  --   --  10.9*  HCT 42.0  --   --  39.6  PLT 138*  --   --  99*  LABPROT 28.7*  --   --   --   INR 2.68*  --   --   --   CREATININE 1.66* 1.54*  1.52*  --  1.38*  TROPONINI  --  0.05* 0.05* 0.06*    Estimated Creatinine Clearance: 30.5 mL/min (by C-G formula based on Cr of 1.38).   Medical History: Past Medical History  Diagnosis Date  . Dysphagia   . Diabetes mellitus   . Alzheimer disease   . Hypokalemia   . Anemia   . 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 DVT (deep venous thrombosis)     [facility]  . Hypertension   . Abnormality of gait 06/13/2014  . Coronary artery disease   . Presence of permanent cardiac pacemaker   . Peripheral vascular disease   . Chronic kidney disease   . Seizures   . Medical history non-contributory     Medications:  Scheduled:  . antiseptic oral rinse  7 mL Mouth Rinse q12n4p  . chlorhexidine  15 mL Mouth Rinse BID  . levalbuterol  0.63 mg Nebulization TID  . piperacillin-tazobactam (ZOSYN)  IV  3.375 g Intravenous Q8H  . sodium chloride  3 mL Intravenous Q12H    Assessment: 29 yoF with history of dementia (Alzheimer's).On Coumadin PTA for history of DVT in 2011. Dose documented at last INR visit at [facility] was 6 mg Wed and Sat, and 4 mg all other days (Patient with severe dementia and no family available at this time to verify dose with - think our records are likely accurate as follow-up was planned for 4/22). Last  warfarin dose documented on 4/14. INR on 4/15 was 2.68. Followed up with Dr. Thereasa Solo on 4/16 and he wants to continue warfarin. Hgb 10.9, plts 99. No signs of bleeding.  Anticipate INR may trend down as patient missed 4/15 dose.  Goal of Therapy:  INR 2-3 Monitor platelets by anticoagulation protocol: Yes   Plan:  Give Warfarin 6 mg X 1 today (Based on home dose) Daily INR/PT, monitor signs of bleeding  Theron Arista, PharmD Clinical Pharmacist - Resident Pager: [phone] 4/16/20162:54 PM

## 2014-07-19 NOTE — Progress Notes (Signed)
[place] TEAM 1 - Stepdown/ICU TEAM Progress Note  [name] OHY:[id number] DOB: [birth year] DOA: 07/18/2014 PCP: Unice Cobble, MD  Admit HPI / Brief Narrative: 74 year old right-handed female with a history of a progressive dementia secondary to Alzheimer's disease who was brought to the ED for altered mental status. The patient is nonambulatory and nonverbal. The patient requires assistance with all activities of daily living including feeding. Upon arrival to the ED today the patient was found to be hypoglycemic. CBG was 25. After receiving D50 she was subsequently started on a dextrose drip and CBG was found to be greater than 300.  Multiple electrolyte abnormalities were also appreciated, to include: sodium unmeasurable, creatinine 1.66. Lactic acid 2.9. .  HPI/Subjective: There is no family present at time of visit.  The patient is not able to provide any history due to advanced Alzheimer's type dementia.  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 rate 20, height 5\' 7"  (1.702 m), weight 53.3 kg (117 lb 8.1 oz), SpO2 100 %.  Intake/Output Summary (Last 24 hours) at 07/19/14 0849 Last data filed at 07/19/14 0810  Gross per 24 hour  Intake    950 ml  Output    650 ml  Net    300 ml   Exam: General: No acute respiratory distress - noncommunicative - cachectic Lungs: Clear to auscultation bilaterally without wheezes or crackles Cardiovascular: Regular rate and rhythm without murmur gallop or rub normal S1 and S2 Abdomen: Nontender, nondistended, soft, bowel sounds positive, no rebound, no ascites, no appreciable mass Extremities: No significant cyanosis, clubbing, or edema bilateral lower extremities  Data Reviewed: Basic Metabolic Panel:  Recent Labs Lab 07/18/14 1511 07/18/14 1830 07/19/14 0241  NA >180* >180* 179*  K 3.7 3.8 3.6  CL >130* >130* >130*  CO2 26 21 24   GLUCOSE 191* 227* 211*  BUN 74* 69* 59*  CREATININE 1.66* 1.54*  1.52* 1.38*  CALCIUM 9.4 9.2 8.6  MG  --  2.5  --     Liver Function Tests:  Recent Labs Lab 07/18/14 1511 07/19/14 0241  AST 55* 53*  ALT 75* 72*  ALKPHOS 117 108  BILITOT 0.3 0.6  PROT 6.5 6.0  ALBUMIN 2.8* 2.5*   Coags:  Recent Labs Lab 07/18/14 1511  INR 2.68*   CBC:  Recent Labs Lab 07/18/14 1511 07/19/14 0241  WBC 7.1 6.9  NEUTROABS 4.3  --   HGB 12.3 10.9*  HCT 42.0 39.6  MCV 97.2 100.0  PLT 138* 99*    Cardiac Enzymes:  Recent Labs Lab 07/18/14 1830 07/18/14 2334 07/19/14 0241  TROPONINI 0.05* 0.05* 0.06*    CBG:  Recent Labs Lab 07/18/14 2211 07/18/14 2341 07/19/14 0149 07/19/14 0405 07/19/14 0608  GLUCAP 159* 186* 153* 189* 205*    Recent Results (from the past 240 hour(s))  MRSA PCR Screening     Status: None   Collection Time: 07/18/14  8:40 PM  Result Value Ref Range Status   MRSA by PCR  NEGATIVE NEGATIVE Final    Comment:        The GeneXpert MRSA Assay (FDA approved for NASAL specimens only), is one component of a comprehensive MRSA colonization surveillance program. It is not intended to diagnose MRSA infection nor to guide or monitor treatment for MRSA infections.      Studies:   Recent x-ray studies have been reviewed in detail by the Attending Physician  Scheduled Meds:  Scheduled Meds: . antiseptic oral rinse  7 mL Mouth Rinse q12n4p  . chlorhexidine  15 mL Mouth Rinse BID  . levalbuterol  0.63 mg Nebulization Q6H  . piperacillin-tazobactam (ZOSYN)  IV  3.375 g Intravenous Q8H  . sodium chloride  3 mL Intravenous Q12H    Time spent on care of this patient: 35 mins   [name] T , MD   [facility] Office  [phone] Pager - Text Page per Shea Evans as per below:  On-Call/Text Page:      [email or web address]      password [email or web address]  If 7PM-7AM, please contact night-coverage [email or web address] Password [email or web address] 07/19/2014, 8:49 AM   LOS: 1 day

## 2014-07-19 NOTE — Progress Notes (Addendum)
INITIAL NUTRITION ASSESSMENT Pt meets criteria for SEVERE MALNUTRITION in the context of CHRONIC ILLNESS as evidenced by loss of 24% bw in 1 year and eating an estimated 75% of estimated needs for > 1 month. DOCUMENTATION CODES Per approved criteria  -Severe malnutrition in the context of chronic illness -underweight   INTERVENTION: - Recommend Swallow Eval prior to diet order  -Once able to take, recommend MVI with minerals  - Monitor oral intake and add appropriate snack/supplements as warranted   NUTRITION DIAGNOSIS: Inadequate oral intake related to likely decline in appetite in state of advanced dementia as evidenced by loss of 24% bw in 1 year.   Goal: Pt to meet >/= 90% of their estimated nutrition needs   Monitor:  Diet order, oral intake, goals of care  Reason for Assessment: MST  74 y.o. female  Admitting Dx: <principal problem not specified>  ASSESSMENT: 74 year old right-handed black female with a history of a progressive dementia secondary to Alzheimer's disease. Progressively declining   Pt non responsive. Caregiver at bedside. She says that the past day or two the patient would not eat anything and was noted to be much more lethargic.   Prior to this acute episode care giver thought pt was eating fairly well. However on further discussion it was found pt eats only 2 meals a day and pt reportedly pockets food in her mouth and doesn't swallow much.   In light of progressive decline and sub-optimal oral intake, caregiver stated she had mentioned idea of PEG to family because she has had experienced this situation before, but they have not really discussed it. Note patient DNR.   Nutrition Focused Physical Exam:  Subcutaneous Fat:  Orbital Region: age appropriate Upper Arm Region: mild fat loss Thoracic and Lumbar Region: n/a  Muscle:  Temple Region: Mild muscle loss Clavicle Bone Region: moderate muscle loss Clavicle and Acromion Bone Region: mild muscle  loss Scapular Bone Region: n/a Dorsal Hand: n/a Patellar Region: well nourished Anterior Thigh Region: mild muscle loss Posterior Calf Region: mild muscle loss  Edema: none noted   Height: Ht Readings from Last 1 Encounters:  07/18/14 5\' 7"  (1.702 m)    Weight: Wt Readings from Last 1 Encounters:  07/18/14 117 lb 8.1 oz (53.3 kg)    Ideal Body Weight: 135 lbs  % Ideal Body Weight: 87%  Wt Readings from Last 10 Encounters:  07/18/14 117 lb 8.1 oz (53.3 kg)  06/13/14 123 lb (55.792 kg)  12/04/13 163 lb 4 oz (74.05 kg)  11/24/13 152 lb 11.2 oz (69.264 kg)  08/06/13 146 lb 6.2 oz (66.4 kg)  05/15/13 150 lb (68.04 kg)  05/07/13 171 lb (77.565 kg)  04/23/13 170 lb 1.5 oz (77.155 kg)  01/18/13 172 lb (78.019 kg)  10/30/12 170 lb (77.111 kg)  ~35 lb loss in ~12 months  Usual Body Weight: unknown  BMI:  Body mass index is 18.4 kg/(m^2).  Estimated Nutritional Needs: [id number] kcals/kg  (30-33 kcal/kg) Protein: 80-91 (1.5-1.7 g/kg) Fluid: >1.6 liters  Skin: Scratches, ecchymosis, MSAD to sacrum/coccyx  Diet Order: Diet NPO time specified  EDUCATION NEEDS: -No education needs identified at this time   Intake/Output Summary (Last 24 hours) at 07/19/14 0822 Last data filed at 07/19/14 0810  Gross per 24 hour  Intake    950 ml  Output    650 ml  Net    300 ml    Last BM: Unknown  Labs:   Recent Labs Lab 07/18/14 1511 07/18/14 1830 07/19/14  0241  NA >180* >180* 179*  K 3.7 3.8 3.6  CL >130* >130* >130*  CO2 26 21 24   BUN 74* 69* 59*  CREATININE 1.66* 1.54*  1.52* 1.38*  CALCIUM 9.4 9.2 8.6  MG  --  2.5  --   GLUCOSE 191* 227* 211*    CBG (last 3)   Recent Labs  07/19/14 0149 07/19/14 0405 07/19/14 0608  GLUCAP 153* 189* 205*    Scheduled Meds: . antiseptic oral rinse  7 mL Mouth Rinse q12n4p  . chlorhexidine  15 mL Mouth Rinse BID  . levalbuterol  0.63 mg Nebulization Q6H  . piperacillin-tazobactam (ZOSYN)  IV  3.375 g  Intravenous Q8H  . sodium chloride  3 mL Intravenous Q12H    Continuous Infusions: . dextrose 5 % and 0.45% NaCl 75 mL/hr at 07/19/14 0124    Past Medical History  Diagnosis Date  . Dysphagia   . Diabetes mellitus   . Alzheimer disease   . Hypokalemia   . Anemia   . 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 DVT (deep venous thrombosis)     [facility]  . Hypertension   . Abnormality of gait 06/13/2014  . Coronary artery disease   . Presence of permanent cardiac pacemaker   . Peripheral vascular disease   . Chronic kidney disease   . Seizures   . Medical history non-contributory     Past Surgical History  Procedure Laterality Date  . Syncope  05/2001    pacer  . Pacemaker placement    . Insert / replace / remove pacemaker    . No past surgeries      Burtis Junes RD, LDN Nutrition Pager: [phone] 07/19/2014 8:22 AM

## 2014-07-20 DIAGNOSIS — E162 Hypoglycemia, unspecified: Secondary | ICD-10-CM

## 2014-07-20 DIAGNOSIS — E876 Hypokalemia: Secondary | ICD-10-CM

## 2014-07-20 DIAGNOSIS — R401 Stupor: Secondary | ICD-10-CM

## 2014-07-20 LAB — GLUCOSE, CAPILLARY
GLUCOSE-CAPILLARY: 266 mg/dL — AB (ref 70–99)
GLUCOSE-CAPILLARY: 68 mg/dL — AB (ref 70–99)
Glucose-Capillary: 244 mg/dL — ABNORMAL HIGH (ref 70–99)
Glucose-Capillary: 283 mg/dL — ABNORMAL HIGH (ref 70–99)
Glucose-Capillary: 285 mg/dL — ABNORMAL HIGH (ref 70–99)
Glucose-Capillary: 186 mg/dL — ABNORMAL HIGH (ref 70–99)
Glucose-Capillary: 102 mg/dL — ABNORMAL HIGH (ref 70–99)

## 2014-07-20 LAB — BASIC METABOLIC PANEL
BUN: 42 mg/dL — AB (ref 6–23)
BUN: 35 mg/dL — ABNORMAL HIGH (ref 6–23)
CO2: 23 mmol/L (ref 19–32)
CO2: 25 mmol/L (ref 19–32)
CREATININE: 1.22 mg/dL — AB (ref 0.50–1.10)
Calcium: 7.9 mg/dL — ABNORMAL LOW (ref 8.4–10.5)
Calcium: 8.3 mg/dL — ABNORMAL LOW (ref 8.4–10.5)
Chloride: >130 mmol/L (ref 96–112)
Creatinine, Ser: 1.21 mg/dL — ABNORMAL HIGH (ref 0.50–1.10)
GFR calc Af Amer: 50 mL/min — ABNORMAL LOW (ref >90)
GFR calc non Af Amer: 43 mL/min — ABNORMAL LOW (ref >90)
GFR, EST AFRICAN AMERICAN: 50 mL/min — AB (ref >90)
GFR, EST NON AFRICAN AMERICAN: 43 mL/min — AB (ref >90)
GLUCOSE: 287 mg/dL — AB (ref 70–99)
Glucose, Bld: 339 mg/dL — ABNORMAL HIGH (ref 70–99)
Potassium: 3 mmol/L — ABNORMAL LOW (ref 3.5–5.1)
Potassium: 2.6 mmol/L — CL (ref 3.5–5.1)
SODIUM: 173 mmol/L — AB (ref 135–145)
SODIUM: 167 mmol/L — AB (ref 135–145)

## 2014-07-20 LAB — CBC
HEMATOCRIT: 32.6 % — AB (ref 36.0–46.0)
Hemoglobin: 9.2 g/dL — ABNORMAL LOW (ref 12.0–15.0)
MCH: 27.2 pg (ref 26.0–34.0)
MCHC: 28.2 g/dL — ABNORMAL LOW (ref 30.0–36.0)
MCV: 96.4 fL (ref 78.0–100.0)
Platelets: 77 10*3/uL — ABNORMAL LOW (ref 150–400)
RBC: 3.38 MIL/uL — ABNORMAL LOW (ref 3.87–5.11)
RDW: 16.5 % — ABNORMAL HIGH (ref 11.5–15.5)
WBC: 5.5 10*3/uL (ref 4.0–10.5)

## 2014-07-20 LAB — PROTIME-INR
INR: 4.33 — ABNORMAL HIGH (ref 0.00–1.49)
Prothrombin Time: 41.8 seconds — ABNORMAL HIGH (ref 11.6–15.2)

## 2014-07-20 MED ORDER — INSULIN ASPART 100 UNIT/ML ~~LOC~~ SOLN
0.0000 [IU] | SUBCUTANEOUS | Status: DC
  Administered 2014-07-20: 5 [IU] via SUBCUTANEOUS
  Administered 2014-07-20 – 2014-07-22 (×6): 2 [IU] via SUBCUTANEOUS
  Administered 2014-07-23: 5 [IU] via SUBCUTANEOUS
  Administered 2014-07-23 (×2): 2 [IU] via SUBCUTANEOUS
  Administered 2014-07-23: 3 [IU] via SUBCUTANEOUS
  Administered 2014-07-23: 1 [IU] via SUBCUTANEOUS
  Administered 2014-07-24 (×2): 5 [IU] via SUBCUTANEOUS

## 2014-07-20 MED ORDER — POTASSIUM CHLORIDE 10 MEQ/100ML IV SOLN
10.0000 meq | INTRAVENOUS | Status: AC
  Administered 2014-07-20 (×4): 10 meq via INTRAVENOUS
  Filled 2014-07-20: qty 100

## 2014-07-20 MED ORDER — DEXTROSE 50 % IV SOLN
INTRAVENOUS | Status: AC
  Administered 2014-07-20
  Filled 2014-07-20: qty 50

## 2014-07-20 MED ORDER — POTASSIUM CHLORIDE 10 MEQ/100ML IV SOLN: 10.0000 meq | INTRAVENOUS | Status: DC

## 2014-07-20 MED ORDER — INSULIN DETEMIR 100 UNIT/ML ~~LOC~~ SOLN
8.0000 [IU] | Freq: Two times a day (BID) | SUBCUTANEOUS | Status: DC
  Administered 2014-07-20 (×2): 8 [IU] via SUBCUTANEOUS
  Filled 2014-07-20 (×4): qty 0.08

## 2014-07-20 MED ORDER — DEXTROSE 5 % IV SOLN
INTRAVENOUS | Status: DC
  Administered 2014-07-20: 05:00:00 via INTRAVENOUS

## 2014-07-20 NOTE — Progress Notes (Signed)
Pt NA 173 down from 175 current fluids were D5 1/2 ns paged M Lynch who ordered IVF changed to D5W will continue to monitor

## 2014-07-20 NOTE — Progress Notes (Signed)
ANTICOAGULATION CONSULT NOTE - Initial Consult  Pharmacy Consult for Warfarin Indication: Hx DVT  Allergies  Allergen Reactions  . Ace Inhibitors Cough    Patient Measurements: Height: 5\' 7"  (170.2 cm) Weight: 117 lb 8.1 oz (53.3 kg) IBW/kg (Calculated) : 61.6  Vital Signs: Temp: 98.5 F (36.9 C) (04/17 0420) Temp Source: Oral (04/17 0420) BP: 131/70 mmHg (04/17 0420) Pulse Rate: 79 (04/16 2328)  Labs:  Recent Labs  07/18/14 1511 07/18/14 1830 07/18/14 2334 07/19/14 0241 07/19/14 1641 07/20/14 0259  HGB 12.3  --   --  10.9*  --  9.2*  HCT 42.0  --   --  39.6  --  32.6*  PLT 138*  --   --  99*  --  77*  LABPROT 28.7*  --   --   --   --  41.8*  INR 2.68*  --   --   --   --  4.33*  CREATININE 1.66* 1.54*  1.52*  --  1.38* 1.53* 1.21*  TROPONINI  --  0.05* 0.05* 0.06*  --   --     Estimated Creatinine Clearance: 34.8 mL/min (by C-G formula based on Cr of 1.21).   Medical History: Past Medical History  Diagnosis Date  . Dysphagia   . Diabetes mellitus   . Alzheimer disease   . Hypokalemia   . Anemia   . 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 DVT (deep venous thrombosis)     [facility]  . Hypertension   . Abnormality of gait 06/13/2014  . Coronary artery disease   . Presence of permanent cardiac pacemaker   . Peripheral vascular disease   . Chronic kidney disease   . Seizures   . Medical history non-contributory     Medications:  Scheduled:  . antiseptic oral rinse  7 mL Mouth Rinse q12n4p  . cefTRIAXone (ROCEPHIN)  IV  1 g Intravenous Q24H  . chlorhexidine  15 mL Mouth Rinse BID  . nystatin   Topical BID  . Warfarin - Pharmacist Dosing Inpatient   Does not apply q1800    Assessment: 61 yoF with history of dementia (Alzheimer's).On Coumadin PTA for history of DVT in 2011. Dose documented at last INR visit at [facility] was 6 mg Wed and Sat, and 4 mg all other days (Patient with severe dementia and no family available at this time to verify  dose with - think our records are likely accurate as follow-up was planned for 4/22). Last warfarin dose documented on 4/14. INR on 4/15 was 2.68. Followed up with Dr. Thereasa Solo on 4/16 and he wants to continue warfarin.   Patient received 6 mg of warfarin on 4/16. 4/17 AM INR 4.33. Will hold dose tonight in setting of supratherapeutic INR. Dr. Thereasa Solo to discuss with family if she truly needs to be continued on warfarin. Hgb 9.2, plts 77. No signs of bleeding.    Goal of Therapy:  INR 2-3 Monitor platelets by anticoagulation protocol: Yes   Plan:  Hold warfarin in setting of supratherapeutic INR tonight Daily INR/PT, monitor signs of bleeding Follow-up plans for continuation of anticoagulation therapy  Theron Arista, PharmD Clinical Pharmacist - Resident Pager: [phone] 4/17/20167:34 AM

## 2014-07-20 NOTE — Progress Notes (Signed)
[place] TEAM 1 - Stepdown/ICU TEAM Progress Note  [name] DGL:[id number] DOB: Jun 08, 1940 DOA: 07/18/2014 PCP: Unice Cobble, MD  Admit HPI / Brief Narrative: 74 year old right-handed female with a history of a progressive dementia secondary to Alzheimer's disease who was brought to the ED for altered mental status. The patient is nonambulatory and nonverbal. The patient requires assistance with all activities of daily living including feeding. Upon arrival to the ED today the patient was found to be hypoglycemic. CBG was 25. After receiving D50 she was subsequently started on a dextrose drip and CBG was found to be greater than 300.  Multiple electrolyte abnormalities were also appreciated, to include: sodium unmeasurable, creatinine 1.66. Lactic acid 2.9. .  HPI/Subjective: The patient remains non-communicative/obtunded.  There is no family present at time of my visit.  Assessment/Plan:  Severe Hypernatremia / Dehydration  consistent with severe free water deficit/dehydration - continue free water via IV - Na slowly correcting as desired   Refractory Hypoglycemia in DM due to minimal to no oral intake - continue dextrose and avoid treatment of anything but extremely elevated CBGs  Acute renal failure  Baseline renal function normal with creatinine of 0.9 - most consistent with prerenal azotemia  - creatinine steadily improving with volume expansion  Hypokalemia Replace IV and follow  UTI continue empiric antibiotics - culture not helpful in identifying single pathogen  Toxic metabolic encephalopathy in the setting of End Stage Alz dementia monitor mental status as above issues are addressed  Thrombocytopenia Likely related to urinary tract infection - follow  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 rate 16, height 5\' 7"  (1.702 m), weight 53.3 kg (117 lb 8.1 oz), SpO2 98 %.  Intake/Output Summary (Last 24 hours) at 07/20/14 1041 Last data filed at 07/20/14 0800  Gross per 24 hour  Intake   1950 ml  Output   1100 ml  Net    850 ml   Exam: General: No acute respiratory distress - noncommunicative - does not appear to be in distress Lungs: Clear to auscultation bilaterally without wheezes or crackles Cardiovascular: Regular rate and rhythm without murmur gallop or rub  Abdomen: Nontender, nondistended, soft, bowel sounds positive, no rebound, no ascites, no appreciable mass Extremities: No significant cyanosis, clubbing, edema bilateral lower extremities  Data Reviewed: Basic Metabolic Panel:  Recent Labs Lab 07/18/14 1511 07/18/14 1830 07/19/14 0241 07/19/14 1641 07/20/14 0259  NA >180* >180* 179* 175* 173*  K 3.7 3.8 3.6 3.3* 3.0*  CL >130* >130* >130* >130* >130*  CO2 26 21 24 24 23   GLUCOSE 191* 227* 211* 328* 339*  BUN 74* 69* 59* 52* 42*  CREATININE 1.66* 1.54*  1.52* 1.38* 1.53* 1.21*  CALCIUM 9.4 9.2 8.6 8.1* 7.9*  MG  --  2.5  --   --   --     Liver Function Tests:  Recent Labs Lab 07/18/14 1511 07/19/14 0241  AST 55* 53*  ALT 75* 72*  ALKPHOS 117 108  BILITOT 0.3 0.6  PROT 6.5 6.0  ALBUMIN 2.8* 2.5*   Coags:  Recent Labs Lab 07/18/14 1511 07/20/14 0259  INR 2.68* 4.33*   CBC:  Recent Labs Lab 07/18/14 1511 07/19/14 0241 07/20/14 0259  WBC 7.1 6.9 5.5  NEUTROABS 4.3  --   --   HGB 12.3 10.9* 9.2*  HCT 42.0 39.6 32.6*  MCV 97.2 100.0 96.4  PLT 138* 99* 77*    Cardiac Enzymes:  Recent Labs Lab 07/18/14 1830  07/18/14 2334 07/19/14 0241  TROPONINI 0.05* 0.05* 0.06*    CBG:  Recent Labs Lab 07/19/14 1746 07/19/14 1931 07/19/14 2326 07/20/14 0426 07/20/14 0804  GLUCAP 250* 273* 244* 266* 283*    Recent Results (from the past 240 hour(s))  Urine culture     Status: None   Collection Time: 07/18/14  3:13 PM  Result Value Ref Range Status   Specimen Description URINE, RANDOM  Final   Special Requests Normal  Final   Colony Count   Final    >=100,000 COLONIES/ML Performed at [facility]    Culture   Final    Multiple bacterial morphotypes present, none predominant. Suggest appropriate recollection if clinically indicated. Performed at [facility]    Report Status 07/19/2014 FINAL  Final  MRSA PCR Screening     Status: None   Collection Time: 07/18/14  8:40 PM  Result Value Ref Range Status   MRSA by PCR NEGATIVE NEGATIVE Final    Comment:        The GeneXpert MRSA Assay (FDA approved for NASAL specimens only), is one component of a comprehensive MRSA colonization surveillance program. It is not intended to diagnose MRSA infection nor to guide or monitor treatment for MRSA infections.      Studies:   Recent x-ray studies have been reviewed in detail by the Attending Physician  Scheduled Meds:  Scheduled Meds: . antiseptic oral rinse  7 mL Mouth Rinse q12n4p  . cefTRIAXone (ROCEPHIN)  IV  1 g Intravenous Q24H  . chlorhexidine  15 mL Mouth Rinse BID  . nystatin   Topical BID  . Warfarin - Pharmacist Dosing Inpatient   Does not apply q1800    Time spent on care of this patient: 35 mins   [facility] [place] T , MD   [facility] Office  [phone] Pager - Text Page per Amion as per below:  On-Call/Text Page:      [email or web address]      password [email or web address]  If 7PM-7AM, please contact night-coverage [email or web address] Password [email or web address] 07/20/2014, 10:41 AM   LOS: 2 days

## 2014-07-21 DIAGNOSIS — R64 Cachexia: Secondary | ICD-10-CM

## 2014-07-21 LAB — GLUCOSE, CAPILLARY
GLUCOSE-CAPILLARY: 221 mg/dL — AB (ref 70–99)
GLUCOSE-CAPILLARY: 74 mg/dL (ref 70–99)
Glucose-Capillary: 103 mg/dL — ABNORMAL HIGH (ref 70–99)
Glucose-Capillary: 121 mg/dL — ABNORMAL HIGH (ref 70–99)
Glucose-Capillary: 82 mg/dL (ref 70–99)
Glucose-Capillary: 81 mg/dL (ref 70–99)
Glucose-Capillary: 100 mg/dL — ABNORMAL HIGH (ref 70–99)
Glucose-Capillary: 165 mg/dL — ABNORMAL HIGH (ref 70–99)
Glucose-Capillary: 152 mg/dL — ABNORMAL HIGH (ref 70–99)

## 2014-07-21 LAB — CBC
HCT: 32.6 % — ABNORMAL LOW (ref 36.0–46.0)
Hemoglobin: 9.2 g/dL — ABNORMAL LOW (ref 12.0–15.0)
MCH: 27.1 pg (ref 26.0–34.0)
MCHC: 28.2 g/dL — ABNORMAL LOW (ref 30.0–36.0)
MCV: 96.2 fL (ref 78.0–100.0)
PLATELETS: 98 10*3/uL — AB (ref 150–400)
RBC: 3.39 MIL/uL — ABNORMAL LOW (ref 3.87–5.11)
RDW: 16.2 % — AB (ref 11.5–15.5)
WBC: 5.3 10*3/uL (ref 4.0–10.5)

## 2014-07-21 LAB — BASIC METABOLIC PANEL
Anion gap: 7 (ref 5–15)
BUN: 31 mg/dL — ABNORMAL HIGH (ref 6–23)
BUN: 26 mg/dL — ABNORMAL HIGH (ref 6–23)
CHLORIDE: 128 mmol/L — AB (ref 96–112)
CO2: 22 mmol/L (ref 19–32)
CO2: 24 mmol/L (ref 19–32)
Calcium: 8.2 mg/dL — ABNORMAL LOW (ref 8.4–10.5)
Calcium: 7.9 mg/dL — ABNORMAL LOW (ref 8.4–10.5)
Chloride: >130 mmol/L (ref 96–112)
Creatinine, Ser: 1.07 mg/dL (ref 0.50–1.10)
Creatinine, Ser: 1 mg/dL (ref 0.50–1.10)
GFR calc Af Amer: 63 mL/min — ABNORMAL LOW (ref >90)
GFR calc non Af Amer: 50 mL/min — ABNORMAL LOW (ref >90)
GFR, EST AFRICAN AMERICAN: 58 mL/min — AB (ref >90)
GFR, EST NON AFRICAN AMERICAN: 55 mL/min — AB (ref >90)
GLUCOSE: 142 mg/dL — AB (ref 70–99)
Glucose, Bld: 136 mg/dL — ABNORMAL HIGH (ref 70–99)
POTASSIUM: 3 mmol/L — AB (ref 3.5–5.1)
POTASSIUM: 3.2 mmol/L — AB (ref 3.5–5.1)
Sodium: 168 mmol/L (ref 135–145)
Sodium: 159 mmol/L — ABNORMAL HIGH (ref 135–145)

## 2014-07-21 LAB — HEMOGLOBIN A1C
Hgb A1c MFr Bld: 8.3 % — ABNORMAL HIGH (ref 4.8–5.6)
MEAN PLASMA GLUCOSE: 192 mg/dL

## 2014-07-21 LAB — MAGNESIUM: Magnesium: 2.1 mg/dL (ref 1.5–2.5)

## 2014-07-21 LAB — PROTIME-INR
INR: 4.9 — AB (ref 0.00–1.49)
PROTHROMBIN TIME: 46.1 s — AB (ref 11.6–15.2)

## 2014-07-21 MED ORDER — POTASSIUM CHLORIDE 10 MEQ/100ML IV SOLN
10.0000 meq | INTRAVENOUS | Status: AC
  Administered 2014-07-21 (×5): 10 meq via INTRAVENOUS
  Filled 2014-07-21: qty 100

## 2014-07-21 MED ORDER — INSULIN DETEMIR 100 UNIT/ML ~~LOC~~ SOLN
8.0000 [IU] | Freq: Every day | SUBCUTANEOUS | Status: DC
  Administered 2014-07-22 – 2014-07-24 (×3): 8 [IU] via SUBCUTANEOUS
  Filled 2014-07-21 (×3): qty 0.08

## 2014-07-21 MED ORDER — DEXTROSE-NACL 5-0.2 % IV SOLN
INTRAVENOUS | Status: DC
  Administered 2014-07-21 – 2014-07-22 (×2): via INTRAVENOUS

## 2014-07-21 NOTE — Progress Notes (Signed)
[place] for Warfarin Indication: Hx DVT  Allergies  Allergen Reactions  . Ace Inhibitors Cough    Patient Measurements: Height: 5\' 7"  (170.2 cm) Weight: 117 lb 8.1 oz (53.3 kg) IBW/kg (Calculated) : 61.6  Vital Signs: Temp: 97.9 F (36.6 C) (04/18 0743) Temp Source: Axillary (04/18 0743) BP: 127/69 mmHg (04/18 0743) Pulse Rate: 64 (04/18 0743)  Labs:  Recent Labs  07/18/14 1511 07/18/14 1830 07/18/14 2334 07/19/14 0241  07/20/14 0259 07/20/14 1523 07/21/14 0209  HGB 12.3  --   --  10.9*  --  9.2*  --  9.2*  HCT 42.0  --   --  39.6  --  32.6*  --  32.6*  PLT 138*  --   --  99*  --  77*  --  98*  LABPROT 28.7*  --   --   --   --  41.8*  --  46.1*  INR 2.68*  --   --   --   --  4.33*  --  4.90*  CREATININE 1.66* 1.54*  1.52*  --  1.38*  < > 1.21* 1.22* 1.07  TROPONINI  --  0.05* 0.05* 0.06*  --   --   --   --   < > = values in this interval not displayed.  Estimated Creatinine Clearance: 39.4 mL/min (by C-G formula based on Cr of 1.07).   Medical History: Past Medical History  Diagnosis Date  . Dysphagia   . Diabetes mellitus   . Alzheimer disease   . Hypokalemia   . Anemia   . 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 DVT (deep venous thrombosis)     [facility]  . Hypertension   . Abnormality of gait 06/13/2014  . Coronary artery disease   . Presence of permanent cardiac pacemaker   . Peripheral vascular disease   . Chronic kidney disease   . Seizures   . Medical history non-contributory     Medications:  Scheduled:  . antiseptic oral rinse  7 mL Mouth Rinse q12n4p  . cefTRIAXone (ROCEPHIN)  IV  1 g Intravenous Q24H  . chlorhexidine  15 mL Mouth Rinse BID  . insulin aspart  0-9 Units Subcutaneous 6 times per day  . [START ON 07/22/2014] insulin detemir  8 Units Subcutaneous Daily  . nystatin   Topical BID  . potassium chloride  10 mEq Intravenous Q1 Hr x 5  . Warfarin - Pharmacist Dosing Inpatient   Does  not apply q1800    Assessment: 32 yoF with history of dementia (Alzheimer's).On Coumadin PTA for history of DVT in 2011. Dose documented at last INR visit at [facility] was 6 mg Wed and Sat, and 4 mg all other days (Patient with severe dementia and no family available at this time to verify dose with - think our records are likely accurate as follow-up was planned for 4/22). Last warfarin dose documented on 4/14. INR on 4/15 was 2.68. Followed up with Dr. Thereasa Solo on 4/16 and he wants to continue warfarin for now  INR today remains supratherapeutic.  No bleeding noted  Goal of Therapy:  INR 2-3 Monitor platelets by anticoagulation protocol: Yes   Plan:  Hold warfarin in setting of supratherapeutic INR tonight Daily INR/PT, monitor signs of bleeding Follow-up plans for continuation of anticoagulation therapy  Thanks for allowing pharmacy to be a part of this patient's care.  Excell Seltzer, PharmD Clinical Pharmacist, [phone] 4/18/201611:12 AM

## 2014-07-21 NOTE — Progress Notes (Signed)
[place] TEAM 1 - Stepdown/ICU TEAM Progress Note  [name] NOB:[id number] DOB: 1941-01-10 DOA: 07/18/2014 PCP: Unice Cobble, MD  Admit HPI / Brief Narrative: 74 year old right-handed female with a history of a progressive dementia secondary to Alzheimer's disease who was brought to the ED for altered mental status. The patient is nonambulatory and nonverbal. The patient requires assistance with all activities of daily living including feeding. Upon arrival to the ED today the patient was found to be hypoglycemic. CBG was 25. After receiving D50 she was subsequently started on a dextrose drip and CBG was found to be greater than 300.  Multiple electrolyte abnormalities were also appreciated, to include: sodium unmeasurable, creatinine 1.66. Lactic acid 2.9. .  HPI/Subjective: The patient is still non-communicative/obtunded.  There is no family present at time of my visit.  Assessment/Plan:  Severe Hypernatremia / Dehydration  consistent with severe free water deficit/dehydration - sodium correction has stalled - change IV fluid to D5 1/4 saline and continue to follow every 12 hours   Refractory Hypoglycemia in DM due to minimal to no oral intake - continue dextrose and avoid treatment of anything but extremely elevated CBGs  Acute renal failure  Baseline renal function normal with creatinine of 0.9 - most consistent with prerenal azotemia  - continues to improve with IV fluid support  Hypokalemia Due to minimal to no intake - continue to replace per IV  UTI - did NOT meet criteria for Sepsis  continue empiric antibiotics - culture not helpful in identifying single pathogen  Toxic metabolic encephalopathy in the setting of End Stage Alz dementia monitor mental status as above issues are addressed  Cachexia  Thrombocytopenia Likely related to urinary tract infection - appears to be improving  History of DVT on chronic Coumadin long-term use use of Coumadin in this patient  is not likely still indicated or appropriate - will continue acutely but discuss further with family/POA  Code Status: NO CODE / DNR Family Communication: no family present at time of exam  Disposition Plan: SDU until sodium less than 160  Consultants: none  Procedures: none  Antibiotics: Rocephin 4/15 > Zosyn 4/15 > 4/16  DVT prophylaxis: Warfarin   Objective: Blood pressure 127/69, pulse 64, temperature 97.9 F (36.6 C), temperature source Axillary, resp. rate 16, height 5\' 7"  (1.702 m), weight 53.3 kg (117 lb 8.1 oz), SpO2 98 %.  Intake/Output Summary (Last 24 hours) at 07/21/14 1009 Last data filed at 07/21/14 0954  Gross per 24 hour  Intake   1650 ml  Output    975 ml  Net    675 ml   Exam: General: noncommunicative - does not appear to be in distress Lungs: Clear to auscultation bilaterally  Cardiovascular: Regular rate and rhythm without murmur gallop or rub  Abdomen: Nontender, nondistended, soft, bowel sounds positive, no rebound, no ascites, no appreciable mass Extremities: No significant cyanosis, clubbing, or edema bilateral lower extremities  Data Reviewed: Basic Metabolic Panel:  Recent Labs Lab 07/18/14 1830 07/19/14 0241 07/19/14 1641 07/20/14 0259 07/20/14 1523 07/21/14 0209  NA >180* 179* 175* 173* 167* 168*  K 3.8 3.6 3.3* 3.0* 2.6* 3.0*  CL >130* >130* >130* >130* >130* >130*  CO2 21 24 24 23 25 22   GLUCOSE 227* 211* 328* 339* 287* 136*  BUN 69* 59* 52* 42* 35* 31*  CREATININE 1.54*  1.52* 1.38* 1.53* 1.21* 1.22* 1.07  CALCIUM 9.2 8.6 8.1* 7.9* 8.3* 8.2*  MG 2.5  --   --   --   --  2.1    Liver Function Tests:  Recent Labs Lab 07/18/14 1511 07/19/14 0241  AST 55* 53*  ALT 75* 72*  ALKPHOS 117 108  BILITOT 0.3 0.6  PROT 6.5 6.0  ALBUMIN 2.8* 2.5*   Coags:  Recent Labs Lab 07/18/14 1511 07/20/14 0259 07/21/14 0209  INR 2.68* 4.33* 4.90*   CBC:  Recent Labs Lab 07/18/14 1511 07/19/14 0241 07/20/14 0259  07/21/14 0209  WBC 7.1 6.9 5.5 5.3  NEUTROABS 4.3  --   --   --   HGB 12.3 10.9* 9.2* 9.2*  HCT 42.0 39.6 32.6* 32.6*  MCV 97.2 100.0 96.4 96.2  PLT 138* 99* 77* 98*    Cardiac Enzymes:  Recent Labs Lab 07/18/14 1830 07/18/14 2334 07/19/14 0241  TROPONINI 0.05* 0.05* 0.06*    CBG:  Recent Labs Lab 07/20/14 2317 07/20/14 2349 07/21/14 0318 07/21/14 0619 07/21/14 0742  GLUCAP 68* 221* 103* 121* 82    Recent Results (from the past 240 hour(s))  Urine culture     Status: None   Collection Time: 07/18/14  3:13 PM  Result Value Ref Range Status   Specimen Description URINE, RANDOM  Final   Special Requests Normal  Final   Colony Count   Final    >=100,000 COLONIES/ML Performed at [facility]    Culture   Final    Multiple bacterial morphotypes present, none predominant. Suggest appropriate recollection if clinically indicated. Performed at [facility]    Report Status 07/19/2014 FINAL  Final  MRSA PCR Screening     Status: None   Collection Time: 07/18/14  8:40 PM  Result Value Ref Range Status   MRSA by PCR NEGATIVE NEGATIVE Final    Comment:        The GeneXpert MRSA Assay (FDA approved for NASAL specimens only), is one component of a comprehensive MRSA colonization surveillance program. It is not intended to diagnose MRSA infection nor to guide or monitor treatment for MRSA infections.      Studies:   Recent x-ray studies have been reviewed in detail by the Attending Physician  Scheduled Meds:  Scheduled Meds: . antiseptic oral rinse  7 mL Mouth Rinse q12n4p  . cefTRIAXone (ROCEPHIN)  IV  1 g Intravenous Q24H  . chlorhexidine  15 mL Mouth Rinse BID  . insulin aspart  0-9 Units Subcutaneous 6 times per day  . insulin detemir  8 Units Subcutaneous BID  . nystatin   Topical BID  . Warfarin - Pharmacist Dosing Inpatient   Does not apply q1800    Time spent on care of this patient: 35 mins   [facility] T , MD   [facility]  [phone] Pager - Text Page per Amion as per below:  On-Call/Text Page:      [email or web address]      password [email or web address]  If 7PM-7AM, please contact night-coverage [email or web address] Password [email or web address] 07/21/2014, 10:09 AM   LOS: 3 days

## 2014-07-21 NOTE — Progress Notes (Signed)
BS 68 NPO 1 amp of D50 given will continue to monitor.

## 2014-07-21 NOTE — Progress Notes (Signed)
Medicare Important Message given? YES (If response is "NO", the following Medicare IM given date fields will be blank) Date Medicare IM given:07/21/2014 Medicare IM given by: Whitman Hero

## 2014-07-21 NOTE — Clinical Documentation Improvement (Signed)
MD's, NP's, and PA's   In the  H + P the diagnosis of "Sepsis" was documented   but the diagnosis was not carried over into the progress notes .  Noted blood cultures were negative.    If this condition was ruled out please document in notes. Thank you  Possible Clinical Conditions:  Sepsis ruled out  Sepsis ruled in  Unable to Determine   Other Clinical Condtion     Thank You, 07/21/14 Bo Merino AddisonRN, BSN,  C. Clinical Documentation Specialist: [place]

## 2014-07-21 NOTE — Clinical Documentation Improvement (Signed)
MD's, NP's, and PA's   Nutritional evaluation on 07/19/14 due to "CHRONIC ILLNESS as evidenced by loss of 24% bw in 1 year and eating an estimated 75% of estimated needs for > 1 month." if a nutritional diagnosis is appropriate for this admission please provide the clinical condition.  BMI 18.4. Thank you     Possible Clinical Conditions?  Malnutrition Moderate Malnutriton Severe Malnutrition   Protein Calorie Malnutrition Severe Protein Calorie Malnutrition  Cachexia    Other Condition Cannot clinically determine   Risk Factors: Hyponatremia, Hypochloremia, Hypokalemia, Hypoglycemia in DM2, Acute Renal Failure,  Alzheimers Dementia Signs & Symptoms: lethargy Diagnostics:BMET daily x 3 Treatment: D50 bolus x2, D51/2NS@75  ml /hr  Thank You, Ree Kida ,RN Clinical Documentation Specialist:  [place] [employer] [facility]

## 2014-07-22 DIAGNOSIS — D473 Essential (hemorrhagic) thrombocythemia: Secondary | ICD-10-CM | POA: Diagnosis present

## 2014-07-22 DIAGNOSIS — E162 Hypoglycemia, unspecified: Secondary | ICD-10-CM | POA: Diagnosis present

## 2014-07-22 DIAGNOSIS — G9341 Metabolic encephalopathy: Secondary | ICD-10-CM

## 2014-07-22 DIAGNOSIS — N179 Acute kidney failure, unspecified: Secondary | ICD-10-CM

## 2014-07-22 DIAGNOSIS — N390 Urinary tract infection, site not specified: Secondary | ICD-10-CM | POA: Diagnosis present

## 2014-07-22 DIAGNOSIS — I82409 Acute embolism and thrombosis of unspecified deep veins of unspecified lower extremity: Secondary | ICD-10-CM

## 2014-07-22 DIAGNOSIS — D75839 Thrombocytosis, unspecified: Secondary | ICD-10-CM | POA: Diagnosis present

## 2014-07-22 LAB — CBC
HCT: 29.9 % — ABNORMAL LOW (ref 36.0–46.0)
Hemoglobin: 8.7 g/dL — ABNORMAL LOW (ref 12.0–15.0)
MCH: 27.2 pg (ref 26.0–34.0)
MCHC: 29.1 g/dL — ABNORMAL LOW (ref 30.0–36.0)
MCV: 93.4 fL (ref 78.0–100.0)
PLATELETS: 90 10*3/uL — AB (ref 150–400)
RBC: 3.2 MIL/uL — AB (ref 3.87–5.11)
RDW: 15.6 % — AB (ref 11.5–15.5)
WBC: 5.1 10*3/uL (ref 4.0–10.5)

## 2014-07-22 LAB — BASIC METABOLIC PANEL
Anion gap: 7 (ref 5–15)
BUN: 23 mg/dL (ref 6–23)
CALCIUM: 7.7 mg/dL — AB (ref 8.4–10.5)
CO2: 25 mmol/L (ref 19–32)
CREATININE: 0.87 mg/dL (ref 0.50–1.10)
Chloride: 124 mmol/L — ABNORMAL HIGH (ref 96–112)
GFR, EST AFRICAN AMERICAN: 75 mL/min — AB (ref >90)
GFR, EST NON AFRICAN AMERICAN: 65 mL/min — AB (ref >90)
Glucose, Bld: 196 mg/dL — ABNORMAL HIGH (ref 70–99)
Potassium: 3.1 mmol/L — ABNORMAL LOW (ref 3.5–5.1)
Sodium: 156 mmol/L — ABNORMAL HIGH (ref 135–145)

## 2014-07-22 LAB — PROTIME-INR
INR: 4.33 — ABNORMAL HIGH (ref 0.00–1.49)
Prothrombin Time: 41.8 seconds — ABNORMAL HIGH (ref 11.6–15.2)

## 2014-07-22 LAB — GLUCOSE, CAPILLARY
GLUCOSE-CAPILLARY: 180 mg/dL — AB (ref 70–99)
Glucose-Capillary: 168 mg/dL — ABNORMAL HIGH (ref 70–99)
Glucose-Capillary: 198 mg/dL — ABNORMAL HIGH (ref 70–99)
Glucose-Capillary: 161 mg/dL — ABNORMAL HIGH (ref 70–99)
Glucose-Capillary: 79 mg/dL (ref 70–99)
Glucose-Capillary: 161 mg/dL — ABNORMAL HIGH (ref 70–99)

## 2014-07-22 MED ORDER — POTASSIUM CHLORIDE 10 MEQ/100ML IV SOLN
10.0000 meq | INTRAVENOUS | Status: AC
  Administered 2014-07-22 – 2014-07-23 (×3): 10 meq via INTRAVENOUS
  Filled 2014-07-22 (×2): qty 100

## 2014-07-22 NOTE — Progress Notes (Signed)
[facility] for Warfarin Indication: Hx DVT  Allergies  Allergen Reactions  . Ace Inhibitors Cough    Patient Measurements: Height: 5\' 7"  (170.2 cm) Weight: 117 lb 8.1 oz (53.3 kg) IBW/kg (Calculated) : 61.6  Vital Signs: Temp: 100.1 F (37.8 C) (04/19 0824) Temp Source: Oral (04/19 0824) BP: 129/69 mmHg (04/19 0824) Pulse Rate: 64 (04/19 0824)  Labs:  Recent Labs  07/20/14 0259  07/21/14 0209 07/21/14 1600 07/22/14 0300  HGB 9.2*  --  9.2*  --  8.7*  HCT 32.6*  --  32.6*  --  29.9*  PLT 77*  --  98*  --  90*  LABPROT 41.8*  --  46.1*  --  41.8*  INR 4.33*  --  4.90*  --  4.33*  CREATININE 1.21*  < > 1.07 1.00 0.87  < > = values in this interval not displayed.  Estimated Creatinine Clearance: 48.5 mL/min (by C-G formula based on Cr of 0.87).   Medical History: Past Medical History  Diagnosis Date  . Dysphagia   . Diabetes mellitus   . Alzheimer disease   . Hypokalemia   . Anemia   . 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 DVT (deep venous thrombosis)     [facility]  . Hypertension   . Abnormality of gait 06/13/2014  . Coronary artery disease   . Presence of permanent cardiac pacemaker   . Peripheral vascular disease   . Chronic kidney disease   . Seizures   . Medical history non-contributory     Medications:  Scheduled:  . antiseptic oral rinse  7 mL Mouth Rinse q12n4p  . cefTRIAXone (ROCEPHIN)  IV  1 g Intravenous Q24H  . chlorhexidine  15 mL Mouth Rinse BID  . insulin aspart  0-9 Units Subcutaneous 6 times per day  . insulin detemir  8 Units Subcutaneous Daily  . nystatin   Topical BID  . Warfarin - Pharmacist Dosing Inpatient   Does not apply q1800    Assessment: 64 yoF with history of dementia (Alzheimer's).On Coumadin PTA for history of DVT in 2011. Dose documented at last INR visit at [facility] was 6 mg Wed and Sat, and 4 mg all other days (Patient with severe dementia and no family available at  this time to verify dose with - think our records are likely accurate as follow-up was planned for 4/22). Last warfarin dose documented on 4/14. INR on 4/15 was 2.68. Followed up with Dr. Thereasa Solo on 4/16 and he wants to continue warfarin for now  INR today remains supratherapeutic.  No bleeding noted  Goal of Therapy:  INR 2-3 Monitor platelets by anticoagulation protocol: Yes   Plan:  Hold warfarin in setting of supratherapeutic INR tonight Daily INR/PT, monitor signs of bleeding Follow-up plans for continuation of anticoagulation therapy  Thanks for allowing pharmacy to be a part of this patient's care.  Excell Seltzer, PharmD Clinical Pharmacist, [phone] 4/19/201610:22 AM

## 2014-07-22 NOTE — Progress Notes (Signed)
[place] TEAM 1 - Stepdown/ICU TEAM Progress Note  [name] MWN:[id number] DOB: [birth year] DOA: 07/18/2014 PCP: Unice Cobble, MD  Admit HPI / Brief Narrative: 74 year old WF PMHx DVT, diabetes type 2, HTN, cardiac pacemaker, Alzheimer's disease, dysphagia  Progressive dementia secondary to Alzheimer's disease who was brought to the ED for altered mental status. The patient is nonambulatory and nonverbal. The patient requires assistance with all activities of daily living including feeding. Upon arrival to the ED today the patient was found to be hypoglycemic. CBG was 25. After receiving D50 she was subsequently started on a dextrose drip and CBG was found to be greater than 300. Multiple electrolyte abnormalities were also appreciated, to include: sodium unmeasurable, creatinine 1.66. Lactic acid 2.9. .  HPI/Subjective: 4/19 alert, nonverbal, does not follow commands  Assessment/Plan: Severe Hypernatremia / Dehydration  -consistent with severe free water deficit/dehydration - continue D5-0.2% saline 49ml/hr;  free water via IV - Na slowly correcting as desired   Refractory Hypoglycemia in DM -Resolved  Acute renal failure (baseline 0.9) -At baseline   Hypokalemia -Potassium IV 10 mEq 3 runs  -Recheck potassium and magnesium in a.m.  UTI -continue empiric antibiotics - culture not helpful in identifying single pathogen  Toxic metabolic encephalopathy in the setting of End Stage Alz dementia -monitor mental status as above issues are addressed  Thrombocytopenia -Likely related to urinary tract infection - follow -Slowly trending up  History of DVT on chronic Coumadin -long-term use use of Coumadin in this patient is not likely still indicated or appropriate - will continue acutely but discuss further with family/POA    Code Status: FULL Family Communication: no family present at time of exam Disposition Plan:     Consultants:   Procedure/Significant  Events: 4/15 urine positive> 100,000 multiple bacterial morphotypes   Culture   Antibiotics: Rocephin 4/15 > Zosyn 4/15 > 4/16  DVT prophylaxis:    Devices    LINES / TUBES:      Continuous Infusions: . dextrose 5 % and 0.2 % NaCl 60 mL/hr at 07/22/14 0700    Objective: VITAL SIGNS: Temp: 97.9 F (36.6 C) (04/19 1912) Temp Source: Axillary (04/19 1912) BP: 117/69 mmHg (04/19 1633) Pulse Rate: 65 (04/19 1633) SPO2; FIO2:   Intake/Output Summary (Last 24 hours) at 07/22/14 2009 Last data filed at 07/22/14 1300  Gross per 24 hour  Intake     85 ml  Output    250 ml  Net   -165 ml     Exam: General: A/O 0, opens eyes will not follow commands nonverbal, No acute respiratory distress Lungs: Clear to auscultation bilaterally without wheezes or crackles Cardiovascular: Regular rate and rhythm without murmur gallop or rub normal S1 and S2 Abdomen: Nontender, nondistended, soft, bowel sounds positive, no rebound, no ascites, no appreciable mass Extremities: No significant cyanosis, clubbing, or edema bilateral lower extremities  Data Reviewed: Basic Metabolic Panel:  Recent Labs Lab 07/18/14 1830  07/20/14 0259 07/20/14 1523 07/21/14 0209 07/21/14 1600 07/22/14 0300  NA >180*  < > 173* 167* 168* 159* 156*  K 3.8  < > 3.0* 2.6* 3.0* 3.2* 3.1*  CL >130*  < > >130* >130* >130* 128* 124*  CO2 21  < > 23 25 22 24 25   GLUCOSE 227*  < > 339* 287* 136* 142* 196*  BUN 69*  < > 42* 35* 31* 26* 23  CREATININE 1.54*  1.52*  < > 1.21* 1.22* 1.07 1.00 0.87  CALCIUM 9.2  < >  7.9* 8.3* 8.2* 7.9* 7.7*  MG 2.5  --   --   --  2.1  --   --   < > = values in this interval not displayed. Liver Function Tests:  Recent Labs Lab 07/18/14 1511 07/19/14 0241  AST 55* 53*  ALT 75* 72*  ALKPHOS 117 108  BILITOT 0.3 0.6  PROT 6.5 6.0  ALBUMIN 2.8* 2.5*   No results for input(s): LIPASE, AMYLASE in the last 168 hours. No results for input(s): AMMONIA in the last 168  hours. CBC:  Recent Labs Lab 07/18/14 1511 07/19/14 0241 07/20/14 0259 07/21/14 0209 07/22/14 0300  WBC 7.1 6.9 5.5 5.3 5.1  NEUTROABS 4.3  --   --   --   --   HGB 12.3 10.9* 9.2* 9.2* 8.7*  HCT 42.0 39.6 32.6* 32.6* 29.9*  MCV 97.2 100.0 96.4 96.2 93.4  PLT 138* 99* 77* 98* 90*   Cardiac Enzymes:  Recent Labs Lab 07/18/14 1830 07/18/14 2334 07/19/14 0241  TROPONINI 0.05* 0.05* 0.06*   BNP (last 3 results) No results for input(s): BNP in the last 8760 hours.  ProBNP (last 3 results) No results for input(s): PROBNP in the last 8760 hours.  CBG:  Recent Labs Lab 07/22/14 0338 07/22/14 0824 07/22/14 1320 07/22/14 1637 07/22/14 1915  GLUCAP 180* 168* 198* 161* 79    Recent Results (from the past 240 hour(s))  Urine culture     Status: None   Collection Time: 07/18/14  3:13 PM  Result Value Ref Range Status   Specimen Description URINE, RANDOM  Final   Special Requests Normal  Final   Colony Count   Final    >=100,000 COLONIES/ML Performed at [facility]    Culture   Final    Multiple bacterial morphotypes present, none predominant. Suggest appropriate recollection if clinically indicated. Performed at [facility]    Report Status 07/19/2014 FINAL  Final  MRSA PCR Screening     Status: None   Collection Time: 07/18/14  8:40 PM  Result Value Ref Range Status   MRSA by PCR NEGATIVE NEGATIVE Final    Comment:        The GeneXpert MRSA Assay (FDA approved for NASAL specimens only), is one component of a comprehensive MRSA colonization surveillance program. It is not intended to diagnose MRSA infection nor to guide or monitor treatment for MRSA infections.      Studies:  Recent x-ray studies have been reviewed in detail by the Attending Physician  Scheduled Meds:  Scheduled Meds: . antiseptic oral rinse  7 mL Mouth Rinse q12n4p  . cefTRIAXone (ROCEPHIN)  IV  1 g Intravenous Q24H  . chlorhexidine  15 mL Mouth Rinse BID  .  insulin aspart  0-9 Units Subcutaneous 6 times per day  . insulin detemir  8 Units Subcutaneous Daily  . nystatin   Topical BID  . potassium chloride  10 mEq Intravenous Q1 Hr x 3  . Warfarin - Pharmacist Dosing Inpatient   Does not apply q1800    Time spent on care of this patient: 40 mins   Nathalie Cavendish, Geraldo Docker , MD  [facility] Office  [phone] Pager - [phone]  On-Call/Text Page:      [email or web address]      password [email or web address]  If 7PM-7AM, please contact night-coverage [email or web address] Password [email or web address] 07/22/2014, 8:09 PM   LOS: 4 days   Care during the described time interval was provided by me .  I have reviewed this  patient's available data, including medical history, events of note, physical examination, radiology studies and test results as part of my evaluation  Dia Crawford, MD [phone] Pager

## 2014-07-22 NOTE — Evaluation (Signed)
Clinical/Bedside Swallow Evaluation Patient Details  Name: [name] MRN: [id number] Date of Birth: 08-14-1940  Today's Date: 07/22/2014 Time: SLP Start Time (ACUTE ONLY): 1054 SLP Stop Time (ACUTE ONLY): 1105 SLP Time Calculation (min) (ACUTE ONLY): 11 min  Past Medical History:  Past Medical History  Diagnosis Date  . Dysphagia   . Diabetes mellitus   . Alzheimer disease   . Hypokalemia   . Anemia   . 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 DVT (deep venous thrombosis)     [facility]  . Hypertension   . Abnormality of gait 06/13/2014  . Coronary artery disease   . Presence of permanent cardiac pacemaker   . Peripheral vascular disease   . Chronic kidney disease   . Seizures   . Medical history non-contributory    Past Surgical History:  Past Surgical History  Procedure Laterality Date  . Syncope  05/2001    pacer  . Pacemaker placement    . Insert / replace / remove pacemaker    . No past surgeries     HPI:  74 year old right-handed female with a history of a progressive dementia secondary to Alzheimer's disease who was brought to the ED for altered mental status. Dx severe hypernatremia, dehydration, hypoglycemia, acute renal failure, toxic metabolic encephalopathy in setting of Alz dz.   Assessment / Plan / Recommendation Clinical Impression  Pt with advanced dementia, nonverbal and did not follow commands, but readily anticipated approaching POs during feeding, appropriately masticated/controlled purees and liquids, was able to use a straw for functional drinking, and showed no overt s/s of aspiration during feeding.  Recommend resuming a diet - dysphagia 1, thin liquids.  Crush meds to give in puree.  Pt will require careful, slow hand-feeding.  Given dx, swallowing abilities will fluctuate and recurring aspiration is likely.  No further SLP f/u is warranted. D/w RN.  Will sign off.       Aspiration Risk  Moderate    Diet Recommendation Dysphagia 1 (Puree);Thin  liquid   Liquid Administration via: Straw Medication Administration: Crushed with puree Supervision: Trained caregiver to feed patient Compensations: Slow rate;Small sips/bites Postural Changes and/or Swallow Maneuvers: Seated upright 90 degrees;Upright 30-60 min after meal    Other  Recommendations Oral Care Recommendations: Oral care BID   Follow Up Recommendations  None                     Swallow Study Prior Functional Status       General Date of Onset: 07/18/14 HPI: 74 year old right-handed female with a history of a progressive dementia secondary to Alzheimer's disease who was brought to the ED for altered mental status. Dx severe hypernatremia, dehydration, hypoglycemia, acute renal failure, toxic metabolic encephalopathy in setting of Alz dz. Type of Study: Bedside swallow evaluation Previous Swallow Assessment: none per records Diet Prior to this Study: NPO Temperature Spikes Noted: Yes Respiratory Status: Room air History of Recent Intubation: No Behavior/Cognition: Alert;Confused;Doesn't follow directions Oral Cavity - Dentition: Edentulous Self-Feeding Abilities: Total assist Patient Positioning: Upright in bed Baseline Vocal Quality:  (non verba;) Volitional Cough: Cognitively unable to elicit Volitional Swallow: Unable to elicit    Oral/Motor/Sensory Function Overall Oral Motor/Sensory Function: Appears within functional limits for tasks assessed (tardive dyskinetic movements)   Ice Chips Ice chips: Not tested   Thin Liquid Thin Liquid: Within functional limits Presentation: Straw    Nectar Thick Nectar Thick Liquid: Not tested   Honey Thick Honey Thick Liquid: Not tested  Puree Puree: Within functional limits Presentation: Spoon   Solid     Solid: Not tested      Estill Bamberg L. Tivis Ringer, [place] CCC/SLP Pager [phone]  Juan Quam Laurice 07/22/2014,11:12 AM

## 2014-07-23 DIAGNOSIS — I480 Paroxysmal atrial fibrillation: Secondary | ICD-10-CM

## 2014-07-23 LAB — GLUCOSE, CAPILLARY
GLUCOSE-CAPILLARY: 129 mg/dL — AB (ref 70–99)
GLUCOSE-CAPILLARY: 274 mg/dL — AB (ref 70–99)
Glucose-Capillary: 111 mg/dL — ABNORMAL HIGH (ref 70–99)
Glucose-Capillary: 150 mg/dL — ABNORMAL HIGH (ref 70–99)
Glucose-Capillary: 158 mg/dL — ABNORMAL HIGH (ref 70–99)
Glucose-Capillary: 168 mg/dL — ABNORMAL HIGH (ref 70–99)
Glucose-Capillary: 235 mg/dL — ABNORMAL HIGH (ref 70–99)

## 2014-07-23 LAB — COMPREHENSIVE METABOLIC PANEL
ALBUMIN: 2 g/dL — AB (ref 3.5–5.2)
ALT: 36 U/L — AB (ref 0–35)
AST: 31 U/L (ref 0–37)
Alkaline Phosphatase: 93 U/L (ref 39–117)
Anion gap: 7 (ref 5–15)
BUN: 17 mg/dL (ref 6–23)
CHLORIDE: 117 mmol/L — AB (ref 96–112)
CO2: 24 mmol/L (ref 19–32)
CREATININE: 0.88 mg/dL (ref 0.50–1.10)
Calcium: 7.6 mg/dL — ABNORMAL LOW (ref 8.4–10.5)
GFR calc Af Amer: 74 mL/min — ABNORMAL LOW (ref >90)
GFR calc non Af Amer: 64 mL/min — ABNORMAL LOW (ref >90)
Glucose, Bld: 176 mg/dL — ABNORMAL HIGH (ref 70–99)
Potassium: 3 mmol/L — ABNORMAL LOW (ref 3.5–5.1)
Sodium: 148 mmol/L — ABNORMAL HIGH (ref 135–145)
TOTAL PROTEIN: 4.7 g/dL — AB (ref 6.0–8.3)
Total Bilirubin: 0.5 mg/dL (ref 0.3–1.2)

## 2014-07-23 LAB — CBC WITH DIFFERENTIAL/PLATELET
Basophils Absolute: 0 10*3/uL (ref 0.0–0.1)
Basophils Relative: 0 % (ref 0–1)
EOS ABS: 0.1 10*3/uL (ref 0.0–0.7)
Eosinophils Relative: 2 % (ref 0–5)
HCT: 28.6 % — ABNORMAL LOW (ref 36.0–46.0)
Hemoglobin: 8.7 g/dL — ABNORMAL LOW (ref 12.0–15.0)
LYMPHS ABS: 2.1 10*3/uL (ref 0.7–4.0)
LYMPHS PCT: 44 % (ref 12–46)
MCH: 27.7 pg (ref 26.0–34.0)
MCHC: 30.4 g/dL (ref 30.0–36.0)
MCV: 91.1 fL (ref 78.0–100.0)
MONO ABS: 0.2 10*3/uL (ref 0.1–1.0)
MONOS PCT: 5 % (ref 3–12)
Neutro Abs: 2.3 10*3/uL (ref 1.7–7.7)
Neutrophils Relative %: 49 % (ref 43–77)
PLATELETS: 99 10*3/uL — AB (ref 150–400)
RBC: 3.14 MIL/uL — ABNORMAL LOW (ref 3.87–5.11)
RDW: 15.2 % (ref 11.5–15.5)
WBC: 4.7 10*3/uL (ref 4.0–10.5)

## 2014-07-23 LAB — PROTIME-INR
INR: 3.97 — ABNORMAL HIGH (ref 0.00–1.49)
PROTHROMBIN TIME: 39 s — AB (ref 11.6–15.2)

## 2014-07-23 LAB — MAGNESIUM: Magnesium: 1.8 mg/dL (ref 1.5–2.5)

## 2014-07-23 MED ORDER — POTASSIUM CHLORIDE 20 MEQ PO PACK: 40.0000 meq | PACK | ORAL | Status: DC

## 2014-07-23 MED ORDER — POTASSIUM CHLORIDE CRYS ER 20 MEQ PO TBCR
40.0000 meq | EXTENDED_RELEASE_TABLET | ORAL | Status: AC
  Administered 2014-07-23 (×2): 40 meq via ORAL
  Filled 2014-07-23 (×2): qty 2

## 2014-07-23 MED ORDER — ENSURE ENLIVE PO LIQD
237.0000 mL | Freq: Two times a day (BID) | ORAL | Status: DC
  Administered 2014-07-23 – 2014-07-25 (×4): 237 mL via ORAL

## 2014-07-23 MED ORDER — MAGNESIUM SULFATE IN D5W 10-5 MG/ML-% IV SOLN
1.0000 g | Freq: Once | INTRAVENOUS | Status: AC
  Administered 2014-07-23: 1 g via INTRAVENOUS
  Filled 2014-07-23: qty 100

## 2014-07-23 MED ORDER — CEFTRIAXONE SODIUM IN DEXTROSE 20 MG/ML IV SOLN
1.0000 g | INTRAVENOUS | Status: AC
  Administered 2014-07-23: 1 g via INTRAVENOUS
  Filled 2014-07-23: qty 50

## 2014-07-23 MED ORDER — DEXTROSE 5 % IV SOLN
INTRAVENOUS | Status: AC
  Administered 2014-07-23: 16:00:00 via INTRAVENOUS
  Administered 2014-07-24: 1 mL via INTRAVENOUS

## 2014-07-23 MED ORDER — POTASSIUM CHLORIDE 10 MEQ/100ML IV SOLN
10.0000 meq | INTRAVENOUS | Status: AC
  Administered 2014-07-23 (×3): 10 meq via INTRAVENOUS
  Filled 2014-07-23 (×4): qty 100

## 2014-07-23 NOTE — Progress Notes (Signed)
Transported Patient to 2W17 VSS RN in room to receive patient

## 2014-07-23 NOTE — Progress Notes (Signed)
Attempted report RN rt call back when ready for report.

## 2014-07-23 NOTE — Progress Notes (Signed)
Patient trans in from 3S. Non verbal, placed on cardiac monitor and call bell paced near her.

## 2014-07-23 NOTE — Progress Notes (Signed)
Progress Note  [name] JJK:[id number] DOB: 1940-07-20 DOA: 07/18/2014 PCP: Unice Cobble, MD  Admit HPI / Brief Narrative:  74 year old WF PMHx DVT, diabetes type 2, HTN, cardiac pacemaker, Alzheimer's disease, dysphagia  Progressive dementia secondary to Alzheimer's disease who was brought to the ED for altered mental status. The patient is nonambulatory and nonverbal. The patient requires assistance with all activities of daily living including feeding. Upon arrival to the ED today the patient was found to be hypoglycemic. CBG was 25. After receiving D50 she was subsequently started on a dextrose drip and CBG was found to be greater than 300. Multiple electrolyte abnormalities were also appreciated, to include: sodium unmeasurable, creatinine 1.66. Lactic acid 2.9. .  HPI/Subjective:  In bed, would not answer questions or follow commands, does not appear to be in any distress.  Assessment/Plan: Severe Hypernatremia / Dehydration  -consistent with severe free water deficit/dehydration, improving with hydration continue D5W for another 24 hours.  Refractory Hypoglycemia in DM -Resolved, likely underlying poor oral intake and being on Levemir.  Acute renal failure (baseline 0.9) -At baseline after hydration  Hypokalemia - Potassium replaced will recheck in the morning  UTI -Culture not helpful as specimen poor, today will be day 5 of empiric IV antibiotic. Stop After today.  Toxic metabolic encephalopathy in the setting of End Stage Alz dementia - Extremely poor baseline, continue supportive care, PT eval, may require placement  Thrombocytopenia -Likely related to urinary tract infection - follow -Slowly trending up  History of paroxysmal atrial fibrillation. Discussed with daughter, has been on Coumadin several years, will switch to Eliquis low-dose. Currently INR supratherapeutic.    Code Status: FULL Family Communication: no family present at time of  exam Disposition Plan:     Consultants:   Procedure/Significant Events: 4/15 urine positive> 100,000 multiple bacterial morphotypes   Culture   Anti-infectives    Start     Dose/Rate Route Frequency Ordered Stop   07/19/14 1700  cefTRIAXone (ROCEPHIN) 1 g in dextrose 5 % 50 mL IVPB - Premix     1 g 100 mL/hr over 30 Minutes Intravenous Every 24 hours 07/19/14 1549     07/19/14 1600  fluconazole (DIFLUCAN) tablet 150 mg  Status:  Discontinued     150 mg Oral  Once 07/19/14 1546 07/19/14 1546   07/19/14 0100  piperacillin-tazobactam (ZOSYN) IVPB 3.375 g  Status:  Discontinued     3.375 g 12.5 mL/hr over 240 Minutes Intravenous Every 8 hours 07/18/14 1740 07/19/14 1549   07/18/14 1800  piperacillin-tazobactam (ZOSYN) IVPB 3.375 g  Status:  Discontinued     3.375 g 12.5 mL/hr over 240 Minutes Intravenous Every 8 hours 07/18/14 1730 07/18/14 1740   07/18/14 1745  piperacillin-tazobactam (ZOSYN) IVPB 3.375 g     3.375 g 100 mL/hr over 30 Minutes Intravenous  Once 07/18/14 1739 07/18/14 1830   07/18/14 1545  cefTRIAXone (ROCEPHIN) 1 g in dextrose 5 % 50 mL IVPB     1 g 100 mL/hr over 30 Minutes Intravenous  Once 07/18/14 1539 07/18/14 1718       DVT prophylaxis:  Eliquis  Devices    LINES / TUBES:      Continuous Infusions: . dextrose 5 % and 0.2 % NaCl 60 mL/hr at 07/22/14 2309    Objective: VITAL SIGNS: Temp: 98.1 F (36.7 C) (04/20 1129) Temp Source: Axillary (04/20 1129) BP: 114/65 mmHg (04/20 1129) Pulse Rate: 71 (04/20 1129) SPO2; FIO2:   Intake/Output Summary (Last 24  hours) at 07/23/14 1307 Last data filed at 07/23/14 1200  Gross per 24 hour  Intake   1926 ml  Output    250 ml  Net   1676 ml     Exam: General: A/O 0, opens eyes will not follow commands nonverbal, No acute respiratory distress Lungs: Clear to auscultation bilaterally without wheezes or crackles Cardiovascular: Regular rate and rhythm without murmur gallop or rub normal S1  and S2 Abdomen: Nontender, nondistended, soft, bowel sounds positive, no rebound, no ascites, no appreciable mass Extremities: No significant cyanosis, clubbing, or edema bilateral lower extremities  Data Reviewed: Basic Metabolic Panel:  Recent Labs Lab 07/18/14 1830  07/20/14 1523 07/21/14 0209 07/21/14 1600 07/22/14 0300 07/23/14 0255  NA >180*  < > 167* 168* 159* 156* 148*  K 3.8  < > 2.6* 3.0* 3.2* 3.1* 3.0*  CL >130*  < > >130* >130* 128* 124* 117*  CO2 21  < > 25 22 24 25 24   GLUCOSE 227*  < > 287* 136* 142* 196* 176*  BUN 69*  < > 35* 31* 26* 23 17  CREATININE 1.54*  1.52*  < > 1.22* 1.07 1.00 0.87 0.88  CALCIUM 9.2  < > 8.3* 8.2* 7.9* 7.7* 7.6*  MG 2.5  --   --  2.1  --   --  1.8  < > = values in this interval not displayed. Liver Function Tests:  Recent Labs Lab 07/18/14 1511 07/19/14 0241 07/23/14 0255  AST 55* 53* 31  ALT 75* 72* 36*  ALKPHOS 117 108 93  BILITOT 0.3 0.6 0.5  PROT 6.5 6.0 4.7*  ALBUMIN 2.8* 2.5* 2.0*   No results for input(s): LIPASE, AMYLASE in the last 168 hours. No results for input(s): AMMONIA in the last 168 hours. CBC:  Recent Labs Lab 07/18/14 1511 07/19/14 0241 07/20/14 0259 07/21/14 0209 07/22/14 0300 07/23/14 0255  WBC 7.1 6.9 5.5 5.3 5.1 4.7  NEUTROABS 4.3  --   --   --   --  2.3  HGB 12.3 10.9* 9.2* 9.2* 8.7* 8.7*  HCT 42.0 39.6 32.6* 32.6* 29.9* 28.6*  MCV 97.2 100.0 96.4 96.2 93.4 91.1  PLT 138* 99* 77* 98* 90* 99*   Cardiac Enzymes:  Recent Labs Lab 07/18/14 1830 07/18/14 2334 07/19/14 0241  TROPONINI 0.05* 0.05* 0.06*   BNP (last 3 results) No results for input(s): BNP in the last 8760 hours.  ProBNP (last 3 results) No results for input(s): PROBNP in the last 8760 hours.  CBG:  Recent Labs Lab 07/23/14 0038 07/23/14 0424 07/23/14 0713 07/23/14 0822 07/23/14 1208  GLUCAP 150* 168* 111* 129* 158*    Recent Results (from the past 240 hour(s))  Urine culture     Status: None   Collection  Time: 07/18/14  3:13 PM  Result Value Ref Range Status   Specimen Description URINE, RANDOM  Final   Special Requests Normal  Final   Colony Count   Final    >=100,000 COLONIES/ML Performed at [facility]    Culture   Final    Multiple bacterial morphotypes present, none predominant. Suggest appropriate recollection if clinically indicated. Performed at [facility]    Report Status 07/19/2014 FINAL  Final  MRSA PCR Screening     Status: None   Collection Time: 07/18/14  8:40 PM  Result Value Ref Range Status   MRSA by PCR NEGATIVE NEGATIVE Final    Comment:        The GeneXpert  MRSA Assay (FDA approved for NASAL specimens only), is one component of a comprehensive MRSA colonization surveillance program. It is not intended to diagnose MRSA infection nor to guide or monitor treatment for MRSA infections.      Studies:  Recent x-ray studies have been reviewed in detail by the Attending Physician  Scheduled Meds:  Scheduled Meds: . antiseptic oral rinse  7 mL Mouth Rinse q12n4p  . cefTRIAXone (ROCEPHIN)  IV  1 g Intravenous Q24H  . chlorhexidine  15 mL Mouth Rinse BID  . insulin aspart  0-9 Units Subcutaneous 6 times per day  . insulin detemir  8 Units Subcutaneous Daily  . nystatin   Topical BID    Time spent on care of this patient: 40 mins  Lala Lund K M.D on 07/23/2014 at 1:07 PM  Between 7am to 7pm - Pager - [phone], After 7pm go to [email or web address] - password [facility]  [facility]  Office  [phone]   LOS: 5 days

## 2014-07-23 NOTE — Progress Notes (Addendum)
[facility] for Warfarin Indication: Hx DVT  Allergies  Allergen Reactions  . Ace Inhibitors Cough    Patient Measurements: Height: 5\' 7"  (170.2 cm) Weight: 117 lb 8.1 oz (53.3 kg) IBW/kg (Calculated) : 61.6  Vital Signs: Temp: 97.8 F (36.6 C) (04/20 0713) Temp Source: Axillary (04/20 0713) BP: 102/60 mmHg (04/20 0713) Pulse Rate: 62 (04/20 0713)  Labs:  Recent Labs  07/21/14 0209 07/21/14 1600 07/22/14 0300 07/23/14 0255  HGB 9.2*  --  8.7* 8.7*  HCT 32.6*  --  29.9* 28.6*  PLT 98*  --  90* 99*  LABPROT 46.1*  --  41.8* 39.0*  INR 4.90*  --  4.33* 3.97*  CREATININE 1.07 1.00 0.87 0.88    Estimated Creatinine Clearance: 47.9 mL/min (by C-G formula based on Cr of 0.88).     Medications:  Scheduled:  . antiseptic oral rinse  7 mL Mouth Rinse q12n4p  . cefTRIAXone (ROCEPHIN)  IV  1 g Intravenous Q24H  . chlorhexidine  15 mL Mouth Rinse BID  . insulin aspart  0-9 Units Subcutaneous 6 times per day  . insulin detemir  8 Units Subcutaneous Daily  . nystatin   Topical BID  . potassium chloride  10 mEq Intravenous Q1 Hr x 3  . potassium chloride  40 mEq Oral Q4H  . Warfarin - Pharmacist Dosing Inpatient   Does not apply q1800    Assessment: 23 yoF with history of dementia (Alzheimer's).On Coumadin PTA for history of DVT in 2011. Dose documented at last INR visit at [facility] was 6 mg Wed and Sat, and 4 mg all other days (Patient with severe dementia and no family available at this time to verify dose with - think our records are likely accurate as follow-up was planned for 4/22). Last warfarin dose documented on 4/14. INR on 4/15 was 2.68. Followed up with Dr. Thereasa Solo on 4/16 and he wants to continue warfarin for now  INR today remains supratherapeutic.  No bleeding noted  Goal of Therapy:  INR 2-3 Monitor platelets by anticoagulation protocol: Yes   Plan:  Hold warfarin in setting of supratherapeutic INR tonight Daily  INR/PT, monitor signs of bleeding Consider dc rocephin.  Thanks for allowing pharmacy to be a part of this patient's care.  Excell Seltzer, PharmD Clinical Pharmacist, [phone] 4/20/201610:40 AM   MD wishes to change to eliquis.  Will need to start when INR <2.0.  F/u am INR

## 2014-07-23 NOTE — Progress Notes (Signed)
NUTRITION FOLLOW UP  Pt meets criteria for SEVERE MALNUTRITION in the context of CHRONIC ILLNESS as evidenced by 24% weight loss in 1 year and eating an estimated 75% of estimated needs for > 1 month.  DOCUMENTATION CODES Per approved criteria  -Severe malnutrition in the context of chronic illness -underweight        Intervention:    Ensure Enlive PO BID, each supplement provides 350 kcal and 20 grams of protein  Nutrition Dx:   Inadequate oral intake related to decline in appetite with advanced dementia as evidenced by 24% weight loss in 1 year.  Goal:   Intake to meet >90% of estimated nutrition needs.  Monitor:   PO intake, labs, weight trend.  Assessment:   Patient admitted with progressive decline secondary to Alzheimer's disease. Hypoglycemia in the ED.  S/P bedside swallow evaluation with SLP. Diet advanced to dysphagia 1 with thin liquids. Patient is eating very well, ate 100% of lunch meal today with caregiver feeding her. She requires a lot of encouragement and assistance with feeding. Caregiver reports that patient drinks strawberry flavored Boost supplements at home. She requests vanilla Ensure for patient while in the hospital.   Height: Ht Readings from Last 1 Encounters:  07/18/14 5\' 7"  (1.702 m)    Weight Status:   Wt Readings from Last 1 Encounters:  07/18/14 117 lb 8.1 oz (53.3 kg)    Re-estimated needs:  Kcal: 1600-1750 Protein: 80-90 gm Fluid: >/= 1.6 L  Skin: stage 2 pressure ulcer to sacrum; diabetic ulcers to left and right heels  Diet Order: DIET - DYS 1 Room service appropriate?: Yes; Fluid consistency:: Thin   Intake/Output Summary (Last 24 hours) at 07/23/14 1458 Last data filed at 07/23/14 1200  Gross per 24 hour  Intake   1926 ml  Output    250 ml  Net   1676 ml    Last BM: 4/15   Labs:   Recent Labs Lab 07/18/14 1830  07/21/14 0209 07/21/14 1600 07/22/14 0300 07/23/14 0255  NA >180*  < > 168* 159* 156* 148*  K  3.8  < > 3.0* 3.2* 3.1* 3.0*  CL >130*  < > >130* 128* 124* 117*  CO2 21  < > 22 24 25 24   BUN 69*  < > 31* 26* 23 17  CREATININE 1.54*  1.52*  < > 1.07 1.00 0.87 0.88  CALCIUM 9.2  < > 8.2* 7.9* 7.7* 7.6*  MG 2.5  --  2.1  --   --  1.8  GLUCOSE 227*  < > 136* 142* 196* 176*  < > = values in this interval not displayed.  CBG (last 3)   Recent Labs  07/23/14 0713 07/23/14 0822 07/23/14 1208  GLUCAP 111* 129* 158*    Scheduled Meds: . antiseptic oral rinse  7 mL Mouth Rinse q12n4p  . cefTRIAXone (ROCEPHIN)  IV  1 g Intravenous Q24H  . chlorhexidine  15 mL Mouth Rinse BID  . insulin aspart  0-9 Units Subcutaneous 6 times per day  . insulin detemir  8 Units Subcutaneous Daily  . nystatin   Topical BID    Continuous Infusions: . dextrose      Molli Barrows, RD, LDN, [place] Pager [phone] After Hours Pager [phone]

## 2014-07-24 ENCOUNTER — Telehealth: Payer: Self-pay

## 2014-07-24 DIAGNOSIS — F0391 Unspecified dementia with behavioral disturbance: Secondary | ICD-10-CM

## 2014-07-24 DIAGNOSIS — Z515 Encounter for palliative care: Secondary | ICD-10-CM

## 2014-07-24 DIAGNOSIS — R531 Weakness: Secondary | ICD-10-CM

## 2014-07-24 DIAGNOSIS — Z66 Do not resuscitate: Secondary | ICD-10-CM

## 2014-07-24 LAB — BASIC METABOLIC PANEL
Anion gap: 8 (ref 5–15)
BUN: 15 mg/dL (ref 6–23)
CO2: 22 mmol/L (ref 19–32)
CREATININE: 0.9 mg/dL (ref 0.50–1.10)
Calcium: 7.6 mg/dL — ABNORMAL LOW (ref 8.4–10.5)
Chloride: 114 mmol/L — ABNORMAL HIGH (ref 96–112)
GFR, EST AFRICAN AMERICAN: 72 mL/min — AB (ref >90)
GFR, EST NON AFRICAN AMERICAN: 62 mL/min — AB (ref >90)
GLUCOSE: 292 mg/dL — AB (ref 70–99)
POTASSIUM: 4.5 mmol/L (ref 3.5–5.1)
Sodium: 144 mmol/L (ref 135–145)

## 2014-07-24 LAB — CBC WITH DIFFERENTIAL/PLATELET
BASOS ABS: 0 10*3/uL (ref 0.0–0.1)
BASOS PCT: 0 % (ref 0–1)
EOS ABS: 0.1 10*3/uL (ref 0.0–0.7)
Eosinophils Relative: 1 % (ref 0–5)
HCT: 27.4 % — ABNORMAL LOW (ref 36.0–46.0)
Hemoglobin: 8.4 g/dL — ABNORMAL LOW (ref 12.0–15.0)
Lymphocytes Relative: 34 % (ref 12–46)
Lymphs Abs: 1.8 10*3/uL (ref 0.7–4.0)
MCH: 27.6 pg (ref 26.0–34.0)
MCHC: 30.7 g/dL (ref 30.0–36.0)
MCV: 90.1 fL (ref 78.0–100.0)
Monocytes Absolute: 0.3 10*3/uL (ref 0.1–1.0)
Monocytes Relative: 6 % (ref 3–12)
Neutro Abs: 3 10*3/uL (ref 1.7–7.7)
Neutrophils Relative %: 59 % (ref 43–77)
Platelets: 90 10*3/uL — ABNORMAL LOW (ref 150–400)
RBC: 3.04 MIL/uL — ABNORMAL LOW (ref 3.87–5.11)
RDW: 15.2 % (ref 11.5–15.5)
WBC: 5.1 10*3/uL (ref 4.0–10.5)

## 2014-07-24 LAB — GLUCOSE, CAPILLARY
GLUCOSE-CAPILLARY: 292 mg/dL — AB (ref 70–99)
GLUCOSE-CAPILLARY: 64 mg/dL — AB (ref 70–99)
GLUCOSE-CAPILLARY: 173 mg/dL — AB (ref 70–99)
Glucose-Capillary: 275 mg/dL — ABNORMAL HIGH (ref 70–99)
Glucose-Capillary: 278 mg/dL — ABNORMAL HIGH (ref 70–99)
Glucose-Capillary: 66 mg/dL — ABNORMAL LOW (ref 70–99)
Glucose-Capillary: 192 mg/dL — ABNORMAL HIGH (ref 70–99)
Glucose-Capillary: 149 mg/dL — ABNORMAL HIGH (ref 70–99)

## 2014-07-24 LAB — PROTIME-INR
INR: 1.98 — ABNORMAL HIGH (ref 0.00–1.49)
Prothrombin Time: 22.7 seconds — ABNORMAL HIGH (ref 11.6–15.2)

## 2014-07-24 LAB — MAGNESIUM: MAGNESIUM: 2 mg/dL (ref 1.5–2.5)

## 2014-07-24 MED ORDER — MUPIROCIN CALCIUM 2 % EX CREA
1.0000 "application " | TOPICAL_CREAM | Freq: Every day | CUTANEOUS | Status: DC
  Administered 2014-07-25: 1 via TOPICAL
  Filled 2014-07-24: qty 15

## 2014-07-24 MED ORDER — APIXABAN 5 MG PO TABS
5.0000 mg | ORAL_TABLET | Freq: Two times a day (BID) | ORAL | Status: DC
  Administered 2014-07-24 – 2014-07-25 (×3): 5 mg via ORAL
  Filled 2014-07-24 (×4): qty 1

## 2014-07-24 MED ORDER — DEXTROSE 50 % IV SOLN
INTRAVENOUS | Status: AC
  Administered 2014-07-24: 50 mL
  Filled 2014-07-24: qty 50

## 2014-07-24 NOTE — Progress Notes (Addendum)
Notified by Steffanie Dunn of family request for Hospice and [place] services at home after discharge. Chart and patient information reveiwed with Dr. Brigid Re [facility] Medical Director and hospice eligibility confirmed.  Writer spoke with Lorna Few the daughter by phone to initiate education related to hospice philosophy, services and team approach to care. Family voiced understanding of information providied. Family requested that foley catheter to be left in at discharge for comfort.  Per discussion plan is for discharge to home by personal vehicle on 4-22 Friday.  Please send signed completed DNR form home with pt/family. Pt will need prescriptions for discharge comfort medications  DME needs discussed pt currently has a hospital bed and BSC in the home Family request a hoyer lift for delivery to the home today [facility] equipment manager Jewel Ysidro Evert notified and will contact [place] to arrange delivery to the home The home address has been verified and is correct in the chart; Lorna Few is the family member to be contacted to arrange time of delivery  [facility] aware of abov Please notify [facility] when pt is ready to leave the unit at discharge- call [phone] (or [phone] after 5pm)  [facility] information and contact numbers have been left at beside. Above information shared with [facility] Please call with any questions  Ardath Sax RN BSN [facility] Liaison [phone]

## 2014-07-24 NOTE — Telephone Encounter (Signed)
I request Hospice MD do this; I am transitioning to retirement

## 2014-07-24 NOTE — Consult Note (Signed)
Patient [name]      DOB: Jan 10, 1941      EXH:[id number]     Consult Note from the Palliative Medicine Team at [facility] Requested by: Dr Candiss Norse     PCP: Unice Cobble, MD Reason for Consultation:Clarification of [place] and options    Phone Number:[phone]  Assessment of patients Current state:  Continued physical, functional and cognitive decline 2/2 to ES-dementia and overall failure to thrive.  Patient is totally dependant for all ADLs, poor po intake with weight loss, increased care needs reports by family. Family faced with advanced directive decisions and anticipatory care needs.   Consult is for review of medical treatment options, clarification of goals of care and end of life issues, disposition and options, and symptom recommendation.  This NP Wadie Lessen reviewed medical records, received report from team, assessed the patient and then meet at the patient's bedside along with her two children and their spouses  to discuss diagnosis, prognosis, GOC, EOL wishes disposition and options.  A detailed discussion was had today regarding advanced directives.  Concepts specific to code status, artifical feeding and hydration, continued IV antibiotics and rehospitalization was had.  The difference between a aggressive medical intervention path  and a palliative comfort care path for this patient at this time was had.  Values and goals of care important to patient and family were attempted to be elicited.  Concept of Hospice and Palliative Care were discussed  Natural trajectory and expectations at EOL were discussed.  Questions and concerns addressed.  Hard Choices booklet left for review. Family encouraged to call with questions or concerns.  PMT will continue to support holistically.   Goals of Care: 1.  Code Status:  DNR/DNI-comfort is the priority of care   2. Scope of Treatment: 1. Vital Signs: per unit  2. Respiratory/Oxygen:as needed for  comfort 3. Nutritional Support/Tube Feeds:no artificial feeding now or in the future 4. Antibiotics:yes 5. IVF:yes 6. Review of Medications to be discontinued: minimize unnecessary meds not supporting GOC   3. Disposition:Home with hospice services, will write for choice   4. Symptom Management:   1. Pain:  Tylenol 650 mg po every 6 hrs prn 2. Bowel Regimen: Senna s-1-2 tablets po qhs  5. Psychosocial:  Emotional support offered to family at bedside. Education offered regarding the expected  Trajectory of dementia and anticipated care needs.     Patient Documents Completed or Given: Document Given Completed  Advanced Directives Pkt    MOST X   DNR  X  Gone from My Sight    Hard Choices X     Brief HPI: 74-yo female with a history of a progressive dementia secondary to Alzheimer's disease. The patient has continued to decline,  she is nonambulatory and nonverbal. Totally dependant for all ADLs  The patient is sleeping most of the day and night. She will occasionally have some reactive aggression. She will occasionally have Seroquel to take if needed. The patient is married, and her husband appears has early dementia.  Margorie John and daughter in law live in the home and are main caregivers  Admitted thru ED,  patient was found to be hypoglycemic. CBG was 25. After receiving D50 CBG improved 30. Subsequently started on a dextrose drip and CBG was found to be greater than 300. Multiple electrolyte abnormalities, sodium unmeasurable, creatinine 1.66. Lactic acid 2.9. The patient has been steadily declining over the last several months.    ROS: unable to illicit  due to altered cognition   PMH:  Past Medical History  Diagnosis Date  . Dysphagia   . Diabetes mellitus   . Alzheimer disease   . Hypokalemia   . Anemia   . 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 DVT (deep venous thrombosis)     [facility]  . Hypertension   . Abnormality of gait 06/13/2014  . Coronary artery  disease   . Presence of permanent cardiac pacemaker   . Peripheral vascular disease   . Chronic kidney disease   . Seizures   . Medical history non-contributory      PSH: Past Surgical History  Procedure Laterality Date  . Syncope  05/2001    pacer  . Pacemaker placement    . Insert / replace / remove pacemaker    . No past surgeries     I have reviewed the [place] and SH and  If appropriate update it with new information. Allergies  Allergen Reactions  . Ace Inhibitors Cough   Scheduled Meds: . antiseptic oral rinse  7 mL Mouth Rinse q12n4p  . apixaban  5 mg Oral BID  . chlorhexidine  15 mL Mouth Rinse BID  . feeding supplement (ENSURE ENLIVE)  237 mL Oral BID BM  . insulin aspart  0-9 Units Subcutaneous 6 times per day  . insulin detemir  8 Units Subcutaneous Daily  . nystatin   Topical BID   Continuous Infusions: . dextrose 1 mL (07/24/14 0605)   PRN Meds:.acetaminophen **OR** acetaminophen, levalbuterol, ondansetron **OR** ondansetron (ZOFRAN) IV    BP 99/58 mmHg  Pulse 86  Temp(Src) 97.8 F (36.6 C) (Axillary)  Resp 20  Ht 5\' 7"  (1.702 m)  Wt 53.3 kg (117 lb 8.1 oz)  BMI 18.40 kg/m2  SpO2 97%   PPS:  20 % at best   Intake/Output Summary (Last 24 hours) at 07/24/14 1000 Last data filed at 07/24/14 9211  Gross per 24 hour  Intake  919.5 ml  Output    826 ml  Net   93.5 ml    Physical Exam:  General: chronically ill appearing, unable to follow commands, non-verbal HEENT: moist buccal membraens Chest:   Decreased in bases CVS: RRR Abdomen:soft NT +BS Ext: without edema  Labs: CBC    Component Value Date/Time   WBC 5.1 07/24/2014 0515   RBC 3.04* 07/24/2014 0515   HGB 8.4* 07/24/2014 0515   HCT 27.4* 07/24/2014 0515   PLT 90* 07/24/2014 0515   MCV 90.1 07/24/2014 0515   MCH 27.6 07/24/2014 0515   MCHC 30.7 07/24/2014 0515   RDW 15.2 07/24/2014 0515   LYMPHSABS 1.8 07/24/2014 0515   MONOABS 0.3 07/24/2014 0515   EOSABS 0.1 07/24/2014 0515    BASOSABS 0.0 07/24/2014 0515    BMET    Component Value Date/Time   NA 144 07/24/2014 0515   K 4.5 07/24/2014 0515   CL 114* 07/24/2014 0515   CO2 22 07/24/2014 0515   GLUCOSE 292* 07/24/2014 0515   BUN 15 07/24/2014 0515   CREATININE 0.90 07/24/2014 0515   CREATININE 1.06 12/16/2011 1826   CALCIUM 7.6* 07/24/2014 0515   GFRNONAA 62* 07/24/2014 0515   GFRAA 72* 07/24/2014 0515    CMP     Component Value Date/Time   NA 144 07/24/2014 0515   K 4.5 07/24/2014 0515   CL 114* 07/24/2014 0515   CO2 22 07/24/2014 0515   GLUCOSE 292* 07/24/2014 0515   BUN 15 07/24/2014 0515   CREATININE  0.90 07/24/2014 0515   CREATININE 1.06 12/16/2011 1826   CALCIUM 7.6* 07/24/2014 0515   PROT 4.7* 07/23/2014 0255   ALBUMIN 2.0* 07/23/2014 0255   AST 31 07/23/2014 0255   ALT 36* 07/23/2014 0255   ALKPHOS 93 07/23/2014 0255   BILITOT 0.5 07/23/2014 0255   GFRNONAA 62* 07/24/2014 0515   GFRAA 72* 07/24/2014 0515     Time In Time Out Total Time Spent with Patient Total Overall Time  0845 1000 70 min 75 min    Greater than 50%  of this time was spent counseling and coordinating care related to the above assessment and plan.   Wadie Lessen NP  Palliative Medicine Team Team Phone # [phone] Pager [phone]  Discussed with Dr Candiss Norse

## 2014-07-24 NOTE — Care Management Note (Signed)
    Page 1 of 2   07/24/2014     2:19:36 PM CARE MANAGEMENT NOTE 07/24/2014  Patient:  [name], [name]   Account Number:  [id number]  Date Initiated:  07/22/2014  Documentation initiated by:  Whitman Hero  Subjective/Objective Assessment:   PTA from home admitted with AMS,hx of alzheimer's disease, non ambulatory. Pt has  personal care aide  to help with feeding and ADL'S.     Action/Plan:   Return to home when medically stable. CM to f/u with d/c needs.   Anticipated DC Date:  07/25/2014   Anticipated DC Plan:  [place]  CM consult      PAC Choice  HOSPICE   Choice offered to / List presented to:  C-4 Adult Children      DME agency  [place] arranged  [facility]EASE [facility]      HH agency  [facility] [place]   Status of service:  In process, will continue to follow Medicare Important Message given?  YES (If response is "NO", the following Medicare IM given date fields will be blank) Date Medicare IM given:  07/22/2014 Medicare IM given by:  Whitman Hero Date Additional Medicare IM given:  07/24/2014 Additional Medicare IM given by:  Marvetta Gibbons  Discharge Disposition:    Per UR Regulation:  Reviewed for med. necessity/level of care/duration of stay  If discussed at [place] of Stay Meetings, dates discussed:    Comments:  DaughterHulen Skains- [phone], Drema Pry (daughter/n/law)- [phone]- sonLegrand Como- [phone]  07/24/14- 1400- Marvetta Gibbons RN, BSN [phone] [facility] at [phone]. Talked to [place]. ELIQUIS is covered. PRIOR AUTHORIZATION required. Prior Authorization phone line is [phone]. Patient will have a $45.00 co payment for a retail pharmacy-- MD notified regarding pre-auth and call made to pt's daughter to let her know copay cost.   07/24/14- 1215- Marvetta Gibbons RN BSN [phone] Per Lattie Haw with [facility]- arrangements have been made for home hospice and they  can admit pt tomorrow 07/25/14- hoyer lift to be delivered to home this evening by [facility].  07/24/14- 1030- Marvetta Gibbons RN, BSN [phone] Referral for home hospice- spoke with [name] at bedside-(pt's daughter) choice offered for home hospice in [facility]- per daughter's choice- would like to use [facility]- referral called to [facility]- heard from [place] with [facility] who will f/u on referral and start process for possible d/c 07/25/14-  per daughter [name]- pt lives with son Legrand Como and his wife Cecila- pt's spouse also lives there- pt already has DME in the home that includes hospital bed, BSC, elevated commode seat, W/C, - family interested in hoyer lift-  family plans to transport pt home in private vehicle- per MD notes plan is to start Eliquis- gave daughter 30 day free card and will do benefits check for this also- CM will f/u with family regarding copay cost.  07/22/2014 @ [place] RN,BSN,CM [phone]      Shaylen Nephew (son) [phone]  Personal care aide Rolland Bimler [phone]) @ bs, no family in room. Aide stated she cares  for pt with ADL'S 5-6 hrs/day. CM to f/u with disposition needs.

## 2014-07-24 NOTE — Progress Notes (Signed)
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 The patient is nonambulatory and nonverbal. The patient requires assistance with all activities of daily living including feeding. Upon arrival to the ED today the patient was found to be hypoglycemic. CBG was 25. After receiving D50 she was subsequently started on a dextrose drip and CBG was found to be greater than 300. Multiple electrolyte abnormalities were also appreciated, to include: sodium unmeasurable, creatinine 1.66. Lactic acid 2.9. .  HPI/Subjective:  In bed, would not answer questions or follow commands, does not appear to be in any distress.  Assessment/Plan:  Severe Hypernatremia / Dehydration  - Due to severe dehydration resolved after D5W   Refractory Hypoglycemia in DM -Resolved, likely underlying poor oral intake and being on Levemir. Now on sliding scale monitor CBGs. Stop D5W.  Lab Results  Component Value Date   HGBA1C 8.3* 07/18/2014    CBG (last 3)   Recent Labs  07/24/14 0101 07/24/14 0613 07/24/14 0639  GLUCAP 292* 275* 278*     Acute renal failure (baseline 0.9) -At baseline after hydration  Hypokalemia - Potassium replaced and stable  UTI -Culture not helpful as specimen poor, today will be day 5 of empiric IV antibiotic. Stop After today.  Toxic metabolic encephalopathy in the setting of End Stage Alz dementia - Extremely poor baseline, continue supportive care, PT eval, may require placement  Thrombocytopenia -Likely related to urinary tract infection - follow -Slowly trending up  History of paroxysmal atrial fibrillation. Discussed with daughter, has been on Coumadin several years, will switch to  Eliquis low-dose.      Code Status: FULL Family Communication: Updated daughter-in-law [name] over the phone Disposition Plan: SNF?    Consultants: Palliative care, wound care   Procedure/Significant Events: 4/15 urine positive> 100,000 multiple bacterial morphotypes   Culture   Anti-infectives    Start     Dose/Rate Route Frequency Ordered Stop   07/23/14 1700  cefTRIAXone (ROCEPHIN) 1 g in dextrose 5 % 50 mL IVPB - Premix     1 g 100 mL/hr over 30 Minutes Intravenous Every 24 hours 07/23/14 1311 07/23/14 1709   07/19/14 1700  cefTRIAXone (ROCEPHIN) 1 g in dextrose 5 % 50 mL IVPB - Premix  Status:  Discontinued     1 g 100 mL/hr over 30 Minutes Intravenous Every 24 hours 07/19/14 1549 07/23/14 1311   07/19/14 1600  fluconazole (DIFLUCAN) tablet 150 mg  Status:  Discontinued     150 mg Oral  Once 07/19/14 1546 07/19/14 1546   07/19/14 0100  piperacillin-tazobactam (ZOSYN) IVPB 3.375 g  Status:  Discontinued     3.375 g 12.5 mL/hr over 240 Minutes Intravenous Every 8 hours 07/18/14 1740 07/19/14 1549   07/18/14 1800  piperacillin-tazobactam (ZOSYN) IVPB 3.375 g  Status:  Discontinued     3.375 g 12.5 mL/hr over 240 Minutes Intravenous Every 8 hours 07/18/14 1730 07/18/14 1740   07/18/14 1745  piperacillin-tazobactam (ZOSYN) IVPB 3.375 g     3.375 g 100 mL/hr over 30 Minutes Intravenous  Once 07/18/14 1739 07/18/14 1830   07/18/14 1545  cefTRIAXone (ROCEPHIN) 1 g in dextrose 5 % 50 mL IVPB     1 g 100 mL/hr over 30 Minutes Intravenous  Once 07/18/14 1539 07/18/14 1718       DVT prophylaxis:  Eliquis  Devices    LINES / TUBES:      Continuous Infusions: . dextrose 1 mL (07/24/14 0605)    Objective: VITAL SIGNS: Temp: 97.8 F (36.6 C) (04/21 0641) Temp Source: Axillary (04/21 0641) BP: 99/58 mmHg (04/21 0641) Pulse Rate: 86 (04/21 0641) SPO2; FIO2:   Intake/Output Summary (Last 24 hours) at 07/24/14 0937 Last data filed at 07/24/14 6160  Gross  per 24 hour  Intake  919.5 ml  Output    826 ml  Net   93.5 ml     Exam: General: A/O 0, opens eyes will not follow commands nonverbal, No acute respiratory distress Lungs: Clear to auscultation bilaterally without wheezes or crackles Cardiovascular: Regular rate and rhythm without murmur gallop or rub normal S1 and S2 Abdomen: Nontender, nondistended, soft, bowel sounds positive, no rebound, no ascites, no appreciable mass Extremities: No significant cyanosis, clubbing, or edema bilateral lower extremities  Data Reviewed: Basic Metabolic Panel:  Recent Labs Lab 07/18/14 1830  07/21/14 0209 07/21/14 1600 07/22/14 0300 07/23/14 0255 07/24/14 0515  NA >180*  < > 168* 159* 156* 148* 144  K 3.8  < > 3.0* 3.2* 3.1* 3.0* 4.5  CL >130*  < > >130* 128* 124* 117* 114*  CO2 21  < > 22 24 25 24 22   GLUCOSE 227*  < > 136* 142* 196* 176* 292*  BUN 69*  < > 31* 26* 23 17 15   CREATININE 1.54*  1.52*  < > 1.07 1.00 0.87 0.88 0.90  CALCIUM 9.2  < > 8.2* 7.9* 7.7* 7.6* 7.6*  MG 2.5  --  2.1  --   --  1.8 2.0  < > = values in this interval not displayed. Liver Function Tests:  Recent Labs Lab 07/18/14 1511 07/19/14 0241 07/23/14 0255  AST 55* 53* 31  ALT 75* 72* 36*  ALKPHOS 117 108 93  BILITOT 0.3 0.6 0.5  PROT 6.5 6.0 4.7*  ALBUMIN 2.8* 2.5* 2.0*   No results for input(s): LIPASE, AMYLASE in the last 168 hours. No results for input(s): AMMONIA in the last 168 hours. CBC:  Recent Labs Lab 07/18/14 1511  07/20/14 0259 07/21/14 0209 07/22/14 0300 07/23/14 0255 07/24/14 0515  WBC 7.1  < > 5.5 5.3 5.1 4.7 5.1  NEUTROABS 4.3  --   --   --   --  2.3 3.0  HGB 12.3  < > 9.2* 9.2* 8.7* 8.7* 8.4*  HCT 42.0  < > 32.6* 32.6* 29.9* 28.6* 27.4*  MCV 97.2  < > 96.4 96.2 93.4 91.1 90.1  PLT 138*  < > 77* 98* 90* 99* 90*  < > = values in this interval not displayed. Cardiac Enzymes:  Recent Labs Lab 07/18/14 1830 07/18/14 2334 07/19/14 0241  TROPONINI 0.05* 0.05* 0.06*    BNP (last 3 results) No results for input(s): BNP in the last 8760 hours.  ProBNP (last 3 results) No results for input(s): PROBNP in the last 8760 hours.  CBG:  Recent Labs Lab 07/23/14 1550 07/23/14 1922 07/24/14 0101 07/24/14 0613 07/24/14 0639  GLUCAP 235* 274* 292* 275* 278*    Recent Results (from the past 240 hour(s))  Urine culture     Status: None   Collection Time: 07/18/14  3:13 PM  Result Value Ref Range Status   Specimen Description URINE, RANDOM  Final   Special Requests Normal  Final   Colony Count  Final    >=100,000 COLONIES/ML Performed at [facility]    Culture   Final    Multiple bacterial morphotypes present, none predominant. Suggest appropriate recollection if clinically indicated. Performed at [facility]    Report Status 07/19/2014 FINAL  Final  MRSA PCR Screening     Status: None   Collection Time: 07/18/14  8:40 PM  Result Value Ref Range Status   MRSA by PCR NEGATIVE NEGATIVE Final    Comment:        The GeneXpert MRSA Assay (FDA approved for NASAL specimens only), is one component of a comprehensive MRSA colonization surveillance program. It is not intended to diagnose MRSA infection nor to guide or monitor treatment for MRSA infections.      Studies:  Recent x-ray studies have been reviewed in detail by the Attending Physician  Scheduled Meds:  Scheduled Meds: . antiseptic oral rinse  7 mL Mouth Rinse q12n4p  . chlorhexidine  15 mL Mouth Rinse BID  . feeding supplement (ENSURE ENLIVE)  237 mL Oral BID BM  . insulin aspart  0-9 Units Subcutaneous 6 times per day  . insulin detemir  8 Units Subcutaneous Daily  . nystatin   Topical BID    Time spent on care of this patient: 40 mins  Lala Lund K M.D on 07/24/2014 at 9:37 AM  Between 7am to 7pm - Pager - [phone], After 7pm go to [email or web address] - password [facility]  [facility]  Office  [phone]   LOS: 6 days

## 2014-07-24 NOTE — Evaluation (Signed)
Occupational Therapy Evaluation and Discharge Patient Details Name: [name] MRN: [id number] DOB: 19-Oct-1940 Today's Date: 07/24/2014    History of Present Illness 74 year old right-handed female with a history of a progressive dementia secondary to Alzheimer's disease who was brought to the ED for altered mental status. Dx severe hypernatremia, dehydration, hypoglycemia, acute renal failure, toxic metabolic encephalopathy in setting of Alz dz   Clinical Impression   This 74 yo female admitted with above presents to acute OT at a total A for BADLs with total A +2 for transfers. Has only been total A for transfers per son for about 2 weeks now. We are recommending a hoyer lift for ease of care, but NO HHOT. We are signing off.    Follow Up Recommendations  No OT follow up    Equipment Recommendations   (hoyer lift)       Precautions / Restrictions Precautions Precautions: Fall Restrictions Weight Bearing Restrictions: No      Mobility Bed Mobility Overal bed mobility: Needs Assistance;+2 for physical assistance Bed Mobility: Supine to Sit     Supine to sit: Total assist;+2 for physical assistance;HOB elevated (pushing posteriorly)     General bed mobility comments: We said to her lets sit up and she automatiically brought her LLE up into bent knee position and then her RLE, but then stopped  Transfers Overall transfer level: Needs assistance Equipment used: 2 person hand held assist Transfers: Sit to/from [employer] Sit to Stand: +2 physical assistance;Max assist   Squat pivot transfers: Max assist;Total assist (did bear weight through her feet)          Balance Overall balance assessment: Needs assistance Sitting-balance support: Bilateral upper extremity supported;Feet supported (bent over with forearms propped on her knees) Sitting balance-Leahy Scale: Zero Sitting balance - Comments: leaning forward and to the right    Standing balance  support: No upper extremity supported Standing balance-Leahy Scale: Zero Standing balance comment: leaning anteriorly                            ADL Overall ADL's : At baseline                                       General ADL Comments: total A     Vision Additional Comments: No apparent change from baseline          Pertinent Vitals/Pain Pain Assessment: Faces Faces Pain Scale: No hurt     Hand Dominance Right (per son)   Extremity/Trunk Assessment Upper Extremity Assessment Upper Extremity Assessment:  (moves arms/hands spontaneously, but not on command)   Lower Extremity Assessment Lower Extremity Assessment: Defer to PT evaluation       Communication Communication Communication: Receptive difficulties;Expressive difficulties   Cognition Arousal/Alertness: Awake/alert Behavior During Therapy: Restless Overall Cognitive Status: Impaired/Different from baseline (progression of Alz/dementia)                                Home Living Family/patient expects to be discharged to:: Private residence Living Arrangements: Children;Spouse/significant other Available Help at Discharge: Family;Personal care attendant;Available 24 hours/day Type of Home: House Home Access: Stairs to enter [employer] of Steps: 3   Home Layout: One level     Bathroom Shower/Tub: Walk-in shower;Curtain  Home Equipment: Shower seat;Bedside commode          Prior Functioning/Environment Level of Independence: Needs assistance  Gait / Transfers Assistance Needed: Per son, pt was walking without AD up until about 2 weeks ago and since then has gotten to where she can't walk and they are only doing full lift transfers with her ADL's / Homemaking Assistance Needed: total A per son (has paid aid during the day that does these tasks for her) Communication / Swallowing Assistance Needed: No verbalizations with [place]; did look at [place]  when we called her first name      OT Diagnosis: Generalized weakness;Cognitive deficits         OT Goals(Current goals can be found in the care plan section) Acute Rehab OT Goals Patient Stated Goal: family: to take her home  OT Frequency:             Co-evaluation PT/OT/SLP Co-Evaluation/Treatment: Yes Reason for Co-Treatment: For patient/therapist safety   OT goals addressed during session: ADL's and self-care;Strengthening/ROM      End of Session Equipment Utilized During Treatment: Gait belt Nurse Communication: Mobility status (tech)  Activity Tolerance: Patient limited by fatigue Patient left: in chair;with call bell/phone within reach;with chair alarm set;with restraints reapplied (Bil mitts)   Time: 9628-3662 OT Time Calculation (min): 36 min Charges:  OT General Charges $OT Visit: 1 Procedure OT Evaluation $Initial OT Evaluation Tier I: 1 Procedure  Almon Register [id number] 07/24/2014, 9:51 AM

## 2014-07-24 NOTE — Progress Notes (Signed)
Noted that insulin orders have been discontinued. Recommend starting Novolog SENSITIVE correction scale every 6 hours since patient is not eating very well and only if CBGs begin to rise above 180 mg/dl. Will continue to follow while in hospital. Harvel Ricks RN BSN CDE

## 2014-07-24 NOTE — Consult Note (Signed)
WOC wound consult note Reason for Consult: Consult requested for bilateral feet. Wound type: Healing unstageable pressure ulcer on right heel Pressure Ulcer POA: Yes Measurement: 3x2.5x0.1 cm Wound bed: surrounding skin intact, dry scabbed wound bed, Drainage (amount, consistency, odor) No drainage or odor Dressing procedure/placement/frequency: Pink foam foot dressing every 5 days PRN. Apply Bactroban on open area.  Open full thickness healing wound to left plantar foot, dry yellow wound bed, no odor or drainage.Marland Kitchen Apply pink foam foot dressing every 5 days or PRN. No drainage or odor noted. 1.5x1x0.1 cm. Float both heels to reduce pressure.  Please re-consult if further assistance is needed.  Thank-you,  Julien Girt MSN, [place], [place], [place], [place]

## 2014-07-24 NOTE — Progress Notes (Signed)
Utilization review completed.  

## 2014-07-24 NOTE — Progress Notes (Addendum)
[place] for apixiban Indication: afib  Allergies  Allergen Reactions  . Ace Inhibitors Cough    Patient Measurements: Height: 5\' 7"  (170.2 cm) Weight: 117 lb 8.1 oz (53.3 kg) IBW/kg (Calculated) : 61.6  Vital Signs: Temp: 97.8 F (36.6 C) (04/21 0641) Temp Source: Axillary (04/21 0641) BP: 99/58 mmHg (04/21 0641) Pulse Rate: 86 (04/21 0641)  Labs:  Recent Labs  07/22/14 0300 07/23/14 0255 07/24/14 0515  HGB 8.7* 8.7* 8.4*  HCT 29.9* 28.6* 27.4*  PLT 90* 99* 90*  LABPROT 41.8* 39.0* 22.7*  INR 4.33* 3.97* 1.98*  CREATININE 0.87 0.88 0.90    Estimated Creatinine Clearance: 46.8 mL/min (by C-G formula based on Cr of 0.9).     Medications:  Scheduled:  . antiseptic oral rinse  7 mL Mouth Rinse q12n4p  . chlorhexidine  15 mL Mouth Rinse BID  . feeding supplement (ENSURE ENLIVE)  237 mL Oral BID BM  . insulin aspart  0-9 Units Subcutaneous 6 times per day  . insulin detemir  8 Units Subcutaneous Daily  . nystatin   Topical BID    Assessment: 22 yoF with history of dementia (Alzheimer's).On Coumadin PTA for history of DVT in 2011 and she is also noted with a history of afib. At this time changing to [place] for history of afib (to start when INR < 2.0). INR today= 1.98.  Noted plans for low dose apixiban per MD note; patient meets criteria for apixiban 5mg  po bid. Discussed with MD and ok to begin apixiban 5mg  po bid.   Goal of Therapy:  INR 2-3 Monitor platelets by anticoagulation protocol: Yes   Plan:  -Apixiban 5mg  po bid -Discontinue daily INRs  Hildred Laser, Pharm D 07/24/2014 9:51 AM

## 2014-07-24 NOTE — Telephone Encounter (Signed)
Phone call from [facility] w/ Hospice and [place]. She states patient is currently at  [facility] discharging tomorrow. Will you be the attending for hospice? Hospice doctors for symptom management? Please advise.

## 2014-07-24 NOTE — Progress Notes (Signed)
Medicare Important Message given? YES  (If response is "NO", the following Medicare IM given date fields will be blank)  Date Medicare IM given: 07/24/14 Medicare IM given by:  Grey Rakestraw  

## 2014-07-24 NOTE — Telephone Encounter (Signed)
[place] has been notified  

## 2014-07-24 NOTE — Evaluation (Signed)
Physical Therapy Evaluation Patient Details Name: [name] MRN: [id number] DOB: 1940-11-29 Today's Date: 07/24/2014   History of Present Illness  74 year old right-handed female with a history of a progressive dementia secondary to Alzheimer's disease who was brought to the ED for altered mental status. Dx severe hypernatremia, dehydration, hypoglycemia, acute renal failure, toxic metabolic encephalopathy in setting of Alz dz  Clinical Impression  Pt admitted with above diagnosis. Pt currently with functional limitations due to the deficits listed below (see PT Problem List). At the time of PT eval pt was able to perform transfers with +2 max to total assist. Pt with significant anterior lean and often braces her forearms on her knees. Per son pt was ambulatory without an AD up until 2 weeks ago. Pt will benefit from skilled PT to increase their independence and safety with mobility to allow discharge to the venue listed below.       Follow Up Recommendations Home health PT;Supervision/Assistance - 24 hour    Equipment Recommendations  Rolling walker with 5" wheels;Other (comment) Harrel Lemon lift)    Recommendations for Other Services       Precautions / Restrictions Precautions Precautions: Fall Restrictions Weight Bearing Restrictions: No      Mobility  Bed Mobility Overal bed mobility: Needs Assistance;+2 for physical assistance Bed Mobility: Supine to Sit     Supine to sit: Total assist;+2 for physical assistance;HOB elevated     General bed mobility comments: We said to her lets sit up and she automatiically brought her LLE up into bent knee position and then her RLE, but then stopped  Transfers Overall transfer level: Needs assistance Equipment used: 2 person hand held assist Transfers: Sit to/from [employer] Sit to Stand: +2 physical assistance;Max assist   Squat pivot transfers: Max assist;Total assist     General transfer comment: Did WB  through her feet but +2 was definitely needed.  Ambulation/Gait                Stairs            Wheelchair Mobility    Modified Rankin (Stroke Patients Only)       Balance Overall balance assessment: Needs assistance Sitting-balance support: Feet supported;Bilateral upper extremity supported Sitting balance-Leahy Scale: Zero Sitting balance - Comments: leaning forward and to the right. Leaning forearms on knees.    Standing balance support: No upper extremity supported Standing balance-Leahy Scale: Zero Standing balance comment: leaning anteriorly                             Pertinent Vitals/Pain Pain Assessment: Faces Faces Pain Scale: No hurt    Home Living Family/patient expects to be discharged to:: Private residence Living Arrangements: Children;Spouse/significant other Available Help at Discharge: Family;Personal care attendant;Available 24 hours/day Type of Home: House Home Access: Stairs to enter   [employer] of Steps: 3 Home Layout: One level Home Equipment: Shower seat;Bedside commode Additional Comments: no family present, pt with cognitive deficits. Uncertain of family ability to A    Prior Function Level of Independence: Needs assistance   Gait / Transfers Assistance Needed: Per son, pt was walking without AD up until about 2 weeks ago and since then has gotten to where she can't walk and they are only doing full lift transfers with her  ADL's / Homemaking Assistance Needed: total A per son (has paid aid during the day that does these tasks for her)  Hand Dominance   Dominant Hand: Right    Extremity/Trunk Assessment   Upper Extremity Assessment: Defer to OT evaluation           Lower Extremity Assessment: Generalized weakness         Communication   Communication: Receptive difficulties;Expressive difficulties  Cognition Arousal/Alertness: Awake/alert Behavior During Therapy:  Restless Overall Cognitive Status: Impaired/Different from baseline (Progression of Alz/dementia)                      General Comments      Exercises        Assessment/Plan    PT Assessment Patient needs continued PT services  PT Diagnosis Difficulty walking;Abnormality of gait;Generalized weakness   PT Problem List Decreased strength;Decreased range of motion;Decreased activity tolerance;Decreased balance;Decreased mobility;Decreased safety awareness;Decreased knowledge of precautions;Decreased coordination;Decreased cognition;Decreased knowledge of use of DME;Cardiopulmonary status limiting activity;Decreased skin integrity  PT Treatment Interventions     PT Goals (Current goals can be found in the Care Plan section) Acute Rehab PT Goals Patient Stated Goal: family: to take her home PT Goal Formulation: Patient unable to participate in goal setting Time For Goal Achievement: 08/07/14 Potential to Achieve Goals: Fair    Frequency Min 3X/week   Barriers to discharge        Co-evaluation PT/OT/SLP Co-Evaluation/Treatment: Yes Reason for Co-Treatment: Complexity of the patient's impairments (multi-system involvement);For patient/therapist safety PT goals addressed during session: Mobility/safety with mobility;Balance OT goals addressed during session: ADL's and self-care;Strengthening/ROM       End of Session Equipment Utilized During Treatment: Gait belt Activity Tolerance: Patient limited by lethargy (dementia) Patient left: in chair;with call bell/phone within reach;with chair alarm set Nurse Communication: Mobility status;Need for lift equipment         Time: 0841-0920 PT Time Calculation (min) (ACUTE ONLY): 39 min   Charges:   PT Evaluation $Initial PT Evaluation Tier I: 1 Procedure PT Treatments $Therapeutic Activity: 8-22 mins   PT G Codes:        Rolinda Roan 08/11/2014, 12:42 PM  Rolinda Roan, PT, DPT Acute Rehabilitation  Services Pager: [phone]

## 2014-07-25 DIAGNOSIS — Z66 Do not resuscitate: Secondary | ICD-10-CM | POA: Insufficient documentation

## 2014-07-25 DIAGNOSIS — R531 Weakness: Secondary | ICD-10-CM

## 2014-07-25 DIAGNOSIS — F0391 Unspecified dementia with behavioral disturbance: Secondary | ICD-10-CM

## 2014-07-25 DIAGNOSIS — F03918 Unspecified dementia, unspecified severity, with other behavioral disturbance: Secondary | ICD-10-CM

## 2014-07-25 LAB — GLUCOSE, CAPILLARY
GLUCOSE-CAPILLARY: 273 mg/dL — AB (ref 70–99)
GLUCOSE-CAPILLARY: 293 mg/dL — AB (ref 70–99)
Glucose-Capillary: 234 mg/dL — ABNORMAL HIGH (ref 70–99)
Glucose-Capillary: 334 mg/dL — ABNORMAL HIGH (ref 70–99)
Glucose-Capillary: 290 mg/dL — ABNORMAL HIGH (ref 70–99)

## 2014-07-25 MED ORDER — APIXABAN 5 MG PO TABS: 5.0000 mg | ORAL_TABLET | Freq: Two times a day (BID) | ORAL | Status: AC

## 2014-07-25 MED ORDER — LORAZEPAM 2 MG/ML PO CONC: 1.0000 mg | Freq: Four times a day (QID) | ORAL | Status: AC | PRN

## 2014-07-25 MED ORDER — MORPHINE SULFATE (CONCENTRATE) 10 MG/0.5ML PO SOLN: 10.0000 mg | ORAL | Status: AC | PRN

## 2014-07-25 MED ORDER — ENSURE ENLIVE PO LIQD: 237.0000 mL | Freq: Two times a day (BID) | ORAL | Status: AC

## 2014-07-25 MED ORDER — INSULIN ASPART 100 UNIT/ML ~~LOC~~ SOLN: 0.0000 [IU] | Freq: Three times a day (TID) | SUBCUTANEOUS | Status: DC

## 2014-07-25 MED ORDER — INSULIN DETEMIR 100 UNIT/ML FLEXPEN: 8.0000 [IU] | PEN_INJECTOR | Freq: Every evening | SUBCUTANEOUS | Status: AC

## 2014-07-25 MED ORDER — INSULIN LISPRO 100 UNIT/ML ~~LOC~~ SOLN: SUBCUTANEOUS | Status: DC

## 2014-07-25 MED ORDER — INSULIN ASPART 100 UNIT/ML ~~LOC~~ SOLN
0.0000 [IU] | Freq: Three times a day (TID) | SUBCUTANEOUS | Status: DC
Start: 2014-07-25 — End: 2014-07-25
  Administered 2014-07-25: 11 [IU] via SUBCUTANEOUS
  Administered 2014-07-25: 8 [IU] via SUBCUTANEOUS

## 2014-07-25 NOTE — Progress Notes (Signed)
Pt family including son, daughter-in-law, grandson and family friend at the bedside for discharge education. Son gave verbal permission to family friend to be present. During medication education son requested clarification on insulin order  Insulin Detemir 100 UNIT/ML Pen  Commonly known as: LEVEMIR FLEXPEN  Inject 8 Units into the skin every evening. 15 units each evening      insulin lispro 100 UNIT/ML injection  Commonly known as: HUMALOG  Use only if blood sugar is over 200.         Nurse paged PA Rogue Bussing for order and clarification on whether to administer 8 units of Insulin Detemir, or 15 units each evening. Nurse requested sliding scale for insulin lispro for family to administer when blood sugar is greater than 200. Rogue Bussing, [place] referred back to previous notes and could not find indication for which dose of insulin detemir to administer. Gave order to give 8 units of detemir because she was admitted with low blood sugar and gave nurse instructions to educate family to follow up with hospice concerning correct dose. Rogue Bussing, [place] also gave nurse instructions to have family follow up with Hospice on sliding scale for insulin lispro.   The nurse educated family on the above information and they verbalized understanding. The daughter-in-law stated "I will call hospice when we leave here. They are supposed to come tonight or tomorrow". Pt left with all belongings with family and discharge instructions.   [name]

## 2014-07-25 NOTE — Progress Notes (Signed)
Patient sitting in bed with caregiver sitting nearby reading to her. Patient for d/c today and dtr in law requesting late discharge due to her work schedule. Son and Dtr. In law will transport pt. home by private vehicle per phone conversation.  DNR and scripts in place for d/c and were faxed to [facility]. Family requests foley to remain in place.  [facility] will come out for admission visit later today or in the morning. Family aware and agrees to this.  Please call [facility] [phone] when pt. discharges from the unit.  [facility] Liaison

## 2014-07-25 NOTE — Discharge Instructions (Signed)
Follow-up with PCP as desired for comfort care  Activity as tolerated with assistance and fall precautions  Diet dysphagia 1 with full assistance and aspiration precautions  Accuchecks 4 times/day, Once in AM empty stomach and then before each meal. Log in all results and show them to your Prim.MD in 3 days. If any glucose reading is under 80 or above 300 call your Prim MD immidiately. Follow Low glucose instructions for glucose under 80 as instructed.  Information on my medicine - ELIQUIS (apixaban)  This medication education was reviewed with me or my healthcare representative as part of my discharge preparation.  The pharmacist that spoke with me during my hospital stay was:  [name], [facility]  Why was Eliquis prescribed for you? Eliquis was prescribed for you to reduce the risk of a blood clot forming that can cause a stroke if you have a medical condition called atrial fibrillation (a type of irregular heartbeat).  What do You need to know about Eliquis ? Take your Eliquis TWICE DAILY - one tablet in the morning and one tablet in the evening with or without food. If you have difficulty swallowing the tablet whole please discuss with your pharmacist how to take the medication safely.  Take Eliquis exactly as prescribed by your doctor and DO NOT stop taking Eliquis without talking to the doctor who prescribed the medication.  Stopping may increase your risk of developing a stroke.  Refill your prescription before you run out.  After discharge, you should have regular check-up appointments with your healthcare provider that is prescribing your Eliquis.  In the future your dose may need to be changed if your kidney function or weight changes by a significant amount or as you get older.  What do you do if you miss a dose? If you miss a dose, take it as soon as you remember on the same day and resume taking twice daily.  Do not take more than one dose of ELIQUIS at the same time  to make up a missed dose.  Important Safety Information A possible side effect of Eliquis is bleeding. You should call your healthcare provider right away if you experience any of the following: ? Bleeding from an injury or your nose that does not stop. ? Unusual colored urine (red or dark brown) or unusual colored stools (red or black). ? Unusual bruising for unknown reasons. ? A serious fall or if you hit your head (even if there is no bleeding).  Some medicines may interact with Eliquis and might increase your risk of bleeding or clotting while on Eliquis. To help avoid this, consult your healthcare provider or pharmacist prior to using any new prescription or non-prescription medications, including herbals, vitamins, non-steroidal anti-inflammatory drugs (NSAIDs) and supplements.  This website has more information on Eliquis (apixaban): http://www.eliquis.com/eliquis/home

## 2014-07-25 NOTE — Discharge Summary (Signed)
[name], is a 74 y.o. female  DOB 11-17-1940  MRN [id number].  Admission date:  07/18/2014  Admitting Physician  Reyne Dumas, MD  Discharge Date:  07/25/2014   Primary MD  Unice Cobble, MD  Recommendations for primary care physician for things to follow:   Discharged with hospice. Goal of care is comfort   Admission Diagnosis  Hyperchloremia [E87.8] Somnolence [R40.0] Hypernatremia [E87.0] Hypoglycemia [E16.2] UTI (lower urinary tract infection) [N39.0] AKI (acute kidney injury) [N17.9] History of dementia [Z86.59]   Discharge Diagnosis  Hyperchloremia [E87.8] Somnolence [R40.0] Hypernatremia [E87.0] Hypoglycemia [E16.2] UTI (lower urinary tract infection) [N39.0] AKI (acute kidney injury) [N17.9] History of dementia [Z86.59]    Active Problems:   Paroxysmal atrial fibrillation   Hypernatremia   Altered mental state   Hypoglycemia   Acute renal failure syndrome   Urinary tract infectious disease   Metabolic encephalopathy   Thrombocythemia   DVT (deep venous thrombosis)      Past Medical History  Diagnosis Date  . Dysphagia   . Diabetes mellitus   . Alzheimer disease   . Hypokalemia   . Anemia   . 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 DVT (deep venous thrombosis)     [facility]  . Hypertension   . Abnormality of gait 06/13/2014  . Coronary artery disease   . Presence of permanent cardiac pacemaker   . Peripheral vascular disease   . Chronic kidney disease   . Seizures   . Medical history non-contributory     Past Surgical History  Procedure Laterality Date  . Syncope  05/2001    pacer  . Pacemaker placement    . Insert / replace / remove pacemaker    . No past surgeries         History of present illness and  Hospital Course:     Kindly see H&P for history of  present illness and admission details, please review complete Labs, Consult reports and Test reports for all details in brief  HPI  from the history and physical done on the day of admission   74 year old right-handed black female with a history of a progressive dementia secondary to Alzheimer's disease. The patient has continued to progress, she is nonambulatory at this point, she is nonverbal. The patient will eat fairly well, but she requires assistance with all activities of daily living including feeding. The patient will pocket food in her mouth. The pills need to be crushed and placed with food. The patient is sleeping most of the day and night. She will occasionally have some reactive aggression. She will occasionally have Seroquel to take if needed. The patient is married, and her husband appears to have early dementia, cannot provide me with any history. The patient remains on Namenda. Recently seen by Dr. Jannifer Franklin, [facility] neurology. Patient's son Legrand Como was agreeable to signing a DO NOT RESUSCITATE form at neurology office on 3/11. Verified with the son today. He would like to continue with a DO NOT RESUSCITATE during this admission.  Upon arrival to the ED today the patient was found to be hypoglycemic. CBG was 25. After receiving D50 CBG improved 30. Subsequently started on a dextrose drip and CBG was found to be greater than 300. Multiple electrolyte abnormalities, sodium unmeasurable, creatinine 1.66. Lactic acid 2.9. Apparently the patient has been steadily declining over the last several months.  Hospital Course      Toxic metabolic encephalopathy in the setting of End Stage Alz dementia - Extremely poor baseline, with supportive care she is back to baseline but remains nonverbal which likely is her baseline. Supportive care. Goal of care now comfort. Home hospice.   Severe Hypernatremia / Dehydration  - Due to severe dehydration resolved after D5W   Refractory Hypoglycemia  in DM upon admission -Resolved, likely underlying poor oral intake and being on Levemir. Now on sliding scale monitor CBGs. Have dropped her long-acting insulin dose will continue sliding scale. Requested to do CBGs before meals at bedtime.   Recent Labs    Lab Results  Component Value Date   HGBA1C 8.3* 07/18/2014      Acute renal failure (baseline 0.9) -At baseline after hydration  Hypokalemia - Potassium replaced and stable  UTI -And he quickly treated. Cultures unreliable.   Thrombocytopenia -Likely related to urinary tract infection - follow -Slowly trending up  History of paroxysmal atrial fibrillation. Discussed with daughter, has been on Coumadin several years, have switched to Eliquis low-dose for her ease of use and no requirement for frequent blood draws.          Discharge Condition: guarded   Follow UP  Follow-up Information    Follow up with Hospice at [facility].   Specialty:  Hospice and Palliative Medicine   Why:  Home Hospice arranged   Contact information:   [place] [address] [phone]       Follow up with Unice Cobble, MD.   Specialty:  Internal Medicine   Why:  as desired   Contact information:   [address] [address] [phone]         Discharge Instructions  and  Discharge Medications      Discharge Instructions    Discharge instructions    Complete by:  As directed   Follow-up with PCP as desired for comfort care  Activity as tolerated with assistance and fall precautions  Diet dysphagia 1 with full assistance and aspiration precautions  Accuchecks 4 times/day, Once in AM empty stomach and then before each meal. Log in all results and show them to your Prim.MD in 3 days. If any glucose reading is under 80 or above 300 call your Prim MD immidiately. Follow Low glucose instructions for glucose under 80 as instructed.     Increase activity slowly    Complete by:  As  directed             Medication List    STOP taking these medications        cephALEXin 500 MG capsule  Commonly known as:  KEFLEX     pravastatin 40 MG tablet  Commonly known as:  PRAVACHOL     warfarin 4 MG tablet  Commonly known as:  COUMADIN      TAKE these medications        apixaban 5 MG Tabs tablet  Commonly known as:  ELIQUIS  Take 1 tablet (5 mg total) by mouth 2 (two) times daily. Call your PCP and get future refills from PCP 10 days  before this medication supply ends     feeding supplement (ENSURE ENLIVE) Liqd  Take 237 mLs by mouth 2 (two) times daily between meals.     glucose blood test strip  ONETOUCH ULTRA ULTRA TEST STRIPS Use to test blood sugar before each meal and at bedtime so 4 times daily ICD 10 E11 59     Insulin Detemir 100 UNIT/ML Pen  Commonly known as:  LEVEMIR FLEXPEN  Inject 8 Units into the skin every evening. 15 units each evening     insulin lispro 100 UNIT/ML injection  Commonly known as:  HUMALOG  Use only if blood sugar is over 200.     LORazepam 2 MG/ML concentrated solution  Commonly known as:  ATIVAN  Take 0.5 mLs (1 mg total) by mouth every 6 (six) hours as needed for anxiety.     memantine 10 MG tablet  Commonly known as:  NAMENDA  Take 1 tablet (10 mg total) by mouth 2 (two) times daily.     morphine CONCENTRATE 10 MG/0.5ML Soln concentrated solution  Take 0.5 mLs (10 mg total) by mouth every 3 (three) hours as needed for moderate pain or severe pain.     ONE TOUCH ULTRA 2 W/DEVICE Kit  Use to test blood sugars before each meal and at bedtime so four times daily ICD 10 E11 59     ONETOUCH DELICA LANCETS 85I Misc  Use to test blood sugar before each meal and at bedtime so four times dialy ICD 10 E11 59     potassium chloride SA 20 MEQ tablet  Commonly known as:  K-DUR,KLOR-CON  Take 1 tablet (20 mEq total) by mouth 2 (two) times daily.     QUEtiapine 50 MG tablet  Commonly known as:  SEROQUEL  TAKE 1 TABLET (50 MG  TOTAL) BY MOUTH AT BEDTIME.          Diet and Activity recommendation: See Discharge Instructions above   Consults obtained - Pall Care  Major procedures and Radiology Reports - PLEASE review detailed and final reports for all details, in brief -       Dg Chest Portable 1 View  07/18/2014   CLINICAL DATA:  Unresponsive.  Hypoglycemic.  EXAM: PORTABLE CHEST - 1 VIEW  COMPARISON:  03/08/2014  FINDINGS: The cardiac silhouette, mediastinal and hilar contours are within normal limits and stable. There is tortuosity and calcification of the thoracic aorta. The pacer wires are stable. Vague upper lobe airspace opacities are noted bilaterally, right greater than left. Could not exclude infiltrates. No pleural effusion or pneumothorax.  IMPRESSION: Upper lobe airspace opacities bilaterally suspicious for infiltrates. No edema or effusions.   Electronically Signed   By: Marijo Sanes M.D.   On: 07/18/2014 15:29    Micro Results      Recent Results (from the past 240 hour(s))  Urine culture     Status: None   Collection Time: 07/18/14  3:13 PM  Result Value Ref Range Status   Specimen Description URINE, RANDOM  Final   Special Requests Normal  Final   Colony Count   Final    >=100,000 COLONIES/ML Performed at [facility]    Culture   Final    Multiple bacterial morphotypes present, none predominant. Suggest appropriate recollection if clinically indicated. Performed at [facility]    Report Status 07/19/2014 FINAL  Final  MRSA PCR Screening     Status: None   Collection Time: 07/18/14  8:40 PM  Result  Value Ref Range Status   MRSA by PCR NEGATIVE NEGATIVE Final    Comment:        The GeneXpert MRSA Assay (FDA approved for NASAL specimens only), is one component of a comprehensive MRSA colonization surveillance program. It is not intended to diagnose MRSA infection nor to guide or monitor treatment for MRSA infections.        Today   Subjective:    [name] today in bed remains nonverbal but appears comfortable  Objective:   Blood pressure 95/52, pulse 82, temperature 97.9 F (36.6 C), temperature source Axillary, resp. rate 17, height $RemoveBe'5\' 7"'DiHEizaDY$  (1.702 m), weight 53.3 kg (117 lb 8.1 oz), SpO2 96 %.   Intake/Output Summary (Last 24 hours) at 07/25/14 1020 Last data filed at 07/24/14 2248  Gross per 24 hour  Intake    360 ml  Output   1351 ml  Net   -991 ml    Exam Awake but will not answer questions or follow commands, appears comfortable in no distress, moving all 4 extremity is myself. [place].AT,PERRAL Supple Neck,No JVD, No cervical lymphadenopathy appriciated.  Symmetrical Chest wall movement, Good air movement bilaterally, CTAB RRR,No Gallops,Rubs or new Murmurs, No Parasternal Heave +ve B.Sounds, Abd Soft, Non tender, No organomegaly appriciated, No rebound -guarding or rigidity. No Cyanosis, Clubbing or edema, No new Rash or bruise  Data Review   CBC w Diff: Lab Results  Component Value Date   WBC 5.1 07/24/2014   HGB 8.4* 07/24/2014   HCT 27.4* 07/24/2014   PLT 90* 07/24/2014   LYMPHOPCT 34 07/24/2014   MONOPCT 6 07/24/2014   EOSPCT 1 07/24/2014   BASOPCT 0 07/24/2014    CMP: Lab Results  Component Value Date   NA 144 07/24/2014   K 4.5 07/24/2014   CL 114* 07/24/2014   CO2 22 07/24/2014   BUN 15 07/24/2014   CREATININE 0.90 07/24/2014   CREATININE 1.06 12/16/2011   PROT 4.7* 07/23/2014   ALBUMIN 2.0* 07/23/2014   BILITOT 0.5 07/23/2014   ALKPHOS 93 07/23/2014   AST 31 07/23/2014   ALT 36* 07/23/2014  .   Total Time in preparing paper work, data evaluation and todays exam - 35 minutes  Thurnell Lose M.D on 07/25/2014 at 10:20 AM  [facility]   Office  [phone]

## 2014-07-27 NOTE — Progress Notes (Signed)
CARE MANAGEMENT NOTE 07/27/2014  Patient:  [name], [name]   Account Number:  [id number]  Date Initiated:  07/22/2014  Documentation initiated by:  COLE,ANGELA  Subjective/Objective Assessment:   PTA from home admitted with AMS,hx of alzheimer's disease, non ambulatory. Pt has  personal care aide  to help with feeding and ADL'S.     Action/Plan:   Return to home when medically stable. CM to f/u with d/c needs.   Anticipated DC Date:  07/25/2014   Anticipated DC Plan:  [place]  CM consult      PAC Choice  HOSPICE   Choice offered to / List presented to:  C-4 Adult Children      DME agency  [facility] arranged  [facility]      HH agency  [facility] [place]   Status of service:  Completed, signed off Medicare Important Message given?  YES (If response is "NO", the following Medicare IM given date fields will be blank) Date Medicare IM given:  07/22/2014 Medicare IM given by:  Whitman Hero Date Additional Medicare IM given:  07/24/2014 Additional Medicare IM given by:  Marvetta Gibbons  Discharge Disposition:  [place]  Per UR Regulation:  Reviewed for med. necessity/level of care/duration of stay  If discussed at [place] of Stay Meetings, dates discussed:    Comments:  DaughterHulen [name]- [phone], [name] (daughter/n/law)- [phone]- sonLegrand [name]- [phone]  07/25/14- 86- Marvetta Gibbons RN, BSN [phone] Spoke with pt's daughternlaw- [name]- plan remains to transport pt via private vehicle home later today when family has available assistance- spoke with Lattie Haw with [facility] who is aware of plan for d/c later today- [facility] will f/u with primary care MD regarding auth for Eliquis.  07/24/14- 1400- Marvetta Gibbons RN, BSN [phone] [name] at [phone]. Talked to [place]. ELIQUIS is covered. PRIOR AUTHORIZATION required. Prior Authorization phone line is  [phone]. Patient will have a $45.00 co payment for a retail pharmacy-- MD notified regarding pre-auth and call made to pt's daughter to let her know copay cost.   07/24/14- 1215- Marvetta Gibbons RN BSN [phone] Per Lattie Haw with [facility]- arrangements have been made for home hospice and they can admit pt tomorrow 07/25/14- hoyer lift to be delivered to home this evening by [facility].  07/24/14- 1030- Marvetta Gibbons RN, BSN [phone] Referral for home hospice- spoke with [name] at bedside-(pt's daughter) choice offered for home hospice in [facility]- per daughter's choice- would like to use [facility]- referral called to [facility]- heard from [place] with [facility] who will f/u on referral and start process for possible d/c 07/25/14-  per daughter [name]- pt lives with [name] and his wife [name]- pt's spouse also lives there- pt already has DME in the home that includes hospital bed, BSC, elevated commode seat, W/C, - family interested in hoyer lift-  family plans to transport pt home in private vehicle- per MD notes plan is to start Eliquis- gave daughter 30 day free card and will do benefits check for this also- CM will f/u with family regarding copay cost.  07/22/2014 @ [place] RN,BSN,CM [phone]      [name] (son) [phone]  Personal care aide Rolland Bimler [phone]) @ bs, no family in room. Aide stated she cares  for pt with ADL'S 5-6 hrs/day. CM to f/u with disposition needs.

## 2014-08-01 ENCOUNTER — Encounter: Payer: Medicare Other | Admitting: Internal Medicine

## 2014-08-05 ENCOUNTER — Encounter: Payer: Self-pay | Admitting: Internal Medicine

## 2014-08-29 ENCOUNTER — Other Ambulatory Visit: Payer: Self-pay | Admitting: Internal Medicine

## 2014-09-23 ENCOUNTER — Telehealth: Payer: Self-pay | Admitting: *Deleted

## 2014-09-23 NOTE — Telephone Encounter (Signed)
Unable to reach patient to schedule lab

## 2014-09-29 ENCOUNTER — Inpatient Hospital Stay (HOSPITAL_COMMUNITY)
Admission: EM | Admit: 2014-09-29 | Discharge: 2014-10-05 | DRG: 193 | Disposition: A | Discharge status: Hospice | Attending: Internal Medicine | Admitting: Internal Medicine

## 2014-09-29 ENCOUNTER — Encounter (HOSPITAL_COMMUNITY): Payer: Self-pay

## 2014-09-29 ENCOUNTER — Emergency Department (HOSPITAL_COMMUNITY)

## 2014-09-29 DIAGNOSIS — E876 Hypokalemia: Secondary | ICD-10-CM | POA: Diagnosis present

## 2014-09-29 DIAGNOSIS — E1149 Type 2 diabetes mellitus with other diabetic neurological complication: Secondary | ICD-10-CM

## 2014-09-29 DIAGNOSIS — G309 Alzheimer's disease, unspecified: Secondary | ICD-10-CM | POA: Diagnosis present

## 2014-09-29 DIAGNOSIS — R1310 Dysphagia, unspecified: Secondary | ICD-10-CM | POA: Diagnosis present

## 2014-09-29 DIAGNOSIS — Y95 Nosocomial condition: Secondary | ICD-10-CM | POA: Diagnosis present

## 2014-09-29 DIAGNOSIS — E43 Unspecified severe protein-calorie malnutrition: Secondary | ICD-10-CM | POA: Diagnosis present

## 2014-09-29 DIAGNOSIS — L89150 Pressure ulcer of sacral region, unstageable: Secondary | ICD-10-CM

## 2014-09-29 DIAGNOSIS — I480 Paroxysmal atrial fibrillation: Secondary | ICD-10-CM | POA: Diagnosis present

## 2014-09-29 DIAGNOSIS — Z86718 Personal history of other venous thrombosis and embolism: Secondary | ICD-10-CM

## 2014-09-29 DIAGNOSIS — I10 Essential (primary) hypertension: Secondary | ICD-10-CM | POA: Diagnosis present

## 2014-09-29 DIAGNOSIS — N179 Acute kidney failure, unspecified: Secondary | ICD-10-CM | POA: Diagnosis present

## 2014-09-29 DIAGNOSIS — E870 Hyperosmolality and hypernatremia: Secondary | ICD-10-CM | POA: Diagnosis present

## 2014-09-29 DIAGNOSIS — Z888 Allergy status to other drugs, medicaments and biological substances status: Secondary | ICD-10-CM

## 2014-09-29 DIAGNOSIS — Z79899 Other long term (current) drug therapy: Secondary | ICD-10-CM

## 2014-09-29 DIAGNOSIS — L89154 Pressure ulcer of sacral region, stage 4: Secondary | ICD-10-CM | POA: Diagnosis present

## 2014-09-29 DIAGNOSIS — I739 Peripheral vascular disease, unspecified: Secondary | ICD-10-CM | POA: Diagnosis present

## 2014-09-29 DIAGNOSIS — J690 Pneumonitis due to inhalation of food and vomit: Secondary | ICD-10-CM | POA: Insufficient documentation

## 2014-09-29 DIAGNOSIS — Z515 Encounter for palliative care: Secondary | ICD-10-CM | POA: Insufficient documentation

## 2014-09-29 DIAGNOSIS — J189 Pneumonia, unspecified organism: Principal | ICD-10-CM | POA: Diagnosis present

## 2014-09-29 DIAGNOSIS — N390 Urinary tract infection, site not specified: Secondary | ICD-10-CM | POA: Diagnosis present

## 2014-09-29 DIAGNOSIS — Z833 Family history of diabetes mellitus: Secondary | ICD-10-CM

## 2014-09-29 DIAGNOSIS — F0280 Dementia in other diseases classified elsewhere without behavioral disturbance: Secondary | ICD-10-CM | POA: Diagnosis present

## 2014-09-29 DIAGNOSIS — E1165 Type 2 diabetes mellitus with hyperglycemia: Secondary | ICD-10-CM | POA: Diagnosis present

## 2014-09-29 DIAGNOSIS — Z794 Long term (current) use of insulin: Secondary | ICD-10-CM

## 2014-09-29 DIAGNOSIS — Z7901 Long term (current) use of anticoagulants: Secondary | ICD-10-CM

## 2014-09-29 DIAGNOSIS — Z7401 Bed confinement status: Secondary | ICD-10-CM

## 2014-09-29 DIAGNOSIS — Z66 Do not resuscitate: Secondary | ICD-10-CM | POA: Diagnosis present

## 2014-09-29 DIAGNOSIS — E860 Dehydration: Secondary | ICD-10-CM | POA: Diagnosis present

## 2014-09-29 DIAGNOSIS — L89612 Pressure ulcer of right heel, stage 2: Secondary | ICD-10-CM | POA: Diagnosis present

## 2014-09-29 DIAGNOSIS — Z820 Family history of epilepsy and other diseases of the nervous system: Secondary | ICD-10-CM

## 2014-09-29 DIAGNOSIS — F0391 Unspecified dementia with behavioral disturbance: Secondary | ICD-10-CM

## 2014-09-29 DIAGNOSIS — F03918 Unspecified dementia, unspecified severity, with other behavioral disturbance: Secondary | ICD-10-CM

## 2014-09-29 DIAGNOSIS — G9340 Encephalopathy, unspecified: Secondary | ICD-10-CM | POA: Diagnosis present

## 2014-09-29 DIAGNOSIS — D649 Anemia, unspecified: Secondary | ICD-10-CM | POA: Diagnosis present

## 2014-09-29 DIAGNOSIS — I2510 Atherosclerotic heart disease of native coronary artery without angina pectoris: Secondary | ICD-10-CM | POA: Diagnosis present

## 2014-09-29 DIAGNOSIS — R509 Fever, unspecified: Secondary | ICD-10-CM

## 2014-09-29 DIAGNOSIS — D696 Thrombocytopenia, unspecified: Secondary | ICD-10-CM | POA: Diagnosis present

## 2014-09-29 DIAGNOSIS — Z950 Presence of cardiac pacemaker: Secondary | ICD-10-CM

## 2014-09-29 DIAGNOSIS — R7989 Other specified abnormal findings of blood chemistry: Secondary | ICD-10-CM

## 2014-09-29 HISTORY — DX: Pneumonia, unspecified organism: J18.9

## 2014-09-29 MED ORDER — SODIUM CHLORIDE 0.9 % IV SOLN
1000.0000 mL | INTRAVENOUS | Status: DC
  Administered 2014-09-30: 1000 mL via INTRAVENOUS

## 2014-09-29 MED ORDER — SODIUM CHLORIDE 0.9 % IV SOLN
1000.0000 mL | Freq: Once | INTRAVENOUS | Status: AC
  Administered 2014-09-29: 1000 mL via INTRAVENOUS

## 2014-09-29 NOTE — ED Provider Notes (Signed)
CSN: [id number]     Arrival date & time 09/29/14  2304 History   First MD Initiated Contact with Patient 09/29/14 2323     Chief Complaint  Patient presents with  . Blood Infection  . Urinary Tract Infection     (Consider location/radiation/quality/duration/timing/severity/associated sxs/prior Treatment) Patient is a 74 y.o. female presenting with urinary tract infection. The history is provided by the EMS personnel. The history is limited by the condition of the patient (Dementia).  Urinary Tract Infection She is a hospice patient who apparently has not been needing for the last 2 days and has been more lethargic than normal for the last day. She is nonverbal at baseline but does make eye contact. She does arrive with DO NOT RESUSCITATE paperwork but no family is with her.  Past Medical History  Diagnosis Date  . Dysphagia   . Diabetes mellitus   . Alzheimer disease   . Hypokalemia   . Anemia   . 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 DVT (deep venous thrombosis)     [facility]  . Hypertension   . Abnormality of gait 06/13/2014  . Coronary artery disease   . Presence of permanent cardiac pacemaker   . Peripheral vascular disease   . Chronic kidney disease   . Seizures   . Medical history non-contributory    Past Surgical History  Procedure Laterality Date  . Syncope  05/2001    pacer  . Pacemaker placement    . Insert / replace / remove pacemaker    . No past surgeries     Family History  Problem Relation Age of Onset  . Alzheimer's disease    . Diabetes    . Stroke Father   . Diabetes Brother   . Seizures Brother   . Diabetes Brother    History  Substance Use Topics  . Smoking status: Never Smoker   . Smokeless tobacco: Not on file     Comment: Patient exposed to second hand smoke daily   . Alcohol Use: No   OB History    No data available     Review of Systems  Unable to perform ROS: Dementia      Allergies  Ace inhibitors  Home Medications    Prior to Admission medications   Medication Sig Start Date End Date Taking? Authorizing Provider  apixaban (ELIQUIS) 5 MG TABS tablet Take 1 tablet (5 mg total) by mouth 2 (two) times daily. Call your PCP and get future refills from PCP 10 days before this medication supply ends 07/25/14   Thurnell Lose, MD  Blood Glucose Monitoring Suppl (ONE TOUCH ULTRA 2) W/DEVICE KIT Use to test blood sugars before each meal and at bedtime so four times daily ICD 10 E11 59 06/09/14   Hendricks Limes, MD  feeding supplement, ENSURE ENLIVE, (ENSURE ENLIVE) LIQD Take 237 mLs by mouth 2 (two) times daily between meals. 07/25/14   Thurnell Lose, MD  glucose blood test strip ONETOUCH ULTRA ULTRA TEST STRIPS Use to test blood sugar before each meal and at bedtime so 4 times daily ICD 10 E11 59 06/09/14   Hendricks Limes, MD  Insulin Detemir (LEVEMIR FLEXPEN) 100 UNIT/ML Pen Inject 8 Units into the skin every evening. 15 units each evening 07/25/14   Thurnell Lose, MD  insulin lispro (HUMALOG) 100 UNIT/ML injection Use only if blood sugar is over 200. 07/25/14   Thurnell Lose, MD  LORazepam (ATIVAN) 2 MG/ML concentrated  solution Take 0.5 mLs (1 mg total) by mouth every 6 (six) hours as needed for anxiety. 07/25/14   Thurnell Lose, MD  memantine (NAMENDA) 10 MG tablet Take 1 tablet (10 mg total) by mouth 2 (two) times daily. 05/12/14   Hendricks Limes, MD  Morphine Sulfate (MORPHINE CONCENTRATE) 10 MG/0.5ML SOLN concentrated solution Take 0.5 mLs (10 mg total) by mouth every 3 (three) hours as needed for moderate pain or severe pain. 07/25/14   Thurnell Lose, MD  [facility] DELICA LANCETS 01B MISC Use to test blood sugar before each meal and at bedtime so four times dialy ICD 10 E11 59 06/09/14   Hendricks Limes, MD  potassium chloride SA (K-DUR,KLOR-CON) 20 MEQ tablet Take 1 tablet (20 mEq total) by mouth 2 (two) times daily. 01/31/14   Hendricks Limes, MD  QUEtiapine (SEROQUEL) 50 MG tablet TAKE 1 TABLET (50 MG  TOTAL) BY MOUTH AT BEDTIME. Patient taking differently: TAKE 1 TABLET (50 MG TOTAL) BY MOUTH as needed AT BEDTIME. 06/09/14   Hendricks Limes, MD   BP 122/87 mmHg  Temp(Src) 100.3 F (37.9 C) (Rectal)  Resp 20  Ht $R'5\' 5"'qL$  (1.651 m)  Wt 110 lb (49.896 kg)  BMI 18.31 kg/m2  SpO2 100% Physical Exam  Nursing note and vitals reviewed.  74 year old female, resting comfortably and in no acute distress. Vital signs are significant for borderline fever. Oxygen saturation is 94%, which is normal. Head is normocephalic and atraumatic. PERRLA, EOMI. Oropharynx is clear. Eyes are sunken. Neck is nontender and supple without adenopathy or JVD. Back is nontender and there is no CVA tenderness. Lungs are clear without rales, wheezes, or rhonchi. Chest is nontender. Heart has regular rate and rhythm without murmur. Abdomen is soft, flat, nontender without masses or hepatosplenomegaly and peristalsis is normoactive. Extremities have no cyanosis or edema, full range of motion is present. Skin is warm and dry without rash. Decreased skin turgor is present. Large right sacral decubitus is present with watery, foul-smelling drainage. Neurologic: She is nonverbal and does not make eye contact but will squeeze fingers on command with both hands. She moves all extremities, and she responds to pain in all extremities.  ED Course  Procedures (including critical care time) Labs Review Results for orders placed or performed during the hospital encounter of 09/29/14  CBC WITH DIFFERENTIAL  Result Value Ref Range   WBC 11.2 (H) 4.0 - 10.5 K/uL   RBC 3.01 (L) 3.87 - 5.11 MIL/uL   Hemoglobin 7.9 (L) 12.0 - 15.0 g/dL   HCT 25.3 (L) 36.0 - 46.0 %   MCV 84.1 78.0 - 100.0 fL   MCH 26.2 26.0 - 34.0 pg   MCHC 31.2 30.0 - 36.0 g/dL   RDW 15.3 11.5 - 15.5 %   Platelets 199 150 - 400 K/uL   Neutrophils Relative % 75 43 - 77 %   Lymphocytes Relative 15 12 - 46 %   Monocytes Relative 10 3 - 12 %   Eosinophils Relative  0 0 - 5 %   Basophils Relative 0 0 - 1 %   Neutro Abs 8.4 (H) 1.7 - 7.7 K/uL   Lymphs Abs 1.7 0.7 - 4.0 K/uL   Monocytes Absolute 1.1 (H) 0.1 - 1.0 K/uL   Eosinophils Absolute 0.0 0.0 - 0.7 K/uL   Basophils Absolute 0.0 0.0 - 0.1 K/uL   WBC Morphology TOXIC GRANULATION   Comprehensive metabolic panel  Result Value Ref Range  Sodium 147 (H) 135 - 145 mmol/L   Potassium 3.2 (L) 3.5 - 5.1 mmol/L   Chloride 113 (H) 101 - 111 mmol/L   CO2 23 22 - 32 mmol/L   Glucose, Bld 292 (H) 65 - 99 mg/dL   BUN 45 (H) 6 - 20 mg/dL   Creatinine, Ser 1.26 (H) 0.44 - 1.00 mg/dL   Calcium 7.7 (L) 8.9 - 10.3 mg/dL   Total Protein 5.6 (L) 6.5 - 8.1 g/dL   Albumin 1.8 (L) 3.5 - 5.0 g/dL   AST 26 15 - 41 U/L   ALT 19 14 - 54 U/L   Alkaline Phosphatase 103 38 - 126 U/L   Total Bilirubin 1.2 0.3 - 1.2 mg/dL   GFR calc non Af Amer 41 (L) >60 mL/min   GFR calc Af Amer 48 (L) >60 mL/min   Anion gap 11 5 - 15  Urinalysis with microscopic  Result Value Ref Range   Color, Urine RED (A) YELLOW   APPearance TURBID (A) CLEAR   Specific Gravity, Urine 1.024 1.005 - 1.030   pH 8.0 5.0 - 8.0   Glucose, UA 100 (A) NEGATIVE mg/dL   Hgb urine dipstick NEGATIVE NEGATIVE   Bilirubin Urine MODERATE (A) NEGATIVE   Ketones, ur 15 (A) NEGATIVE mg/dL   Protein, ur >300 (A) NEGATIVE mg/dL   Urobilinogen, UA 1.0 0.0 - 1.0 mg/dL   Nitrite POSITIVE (A) NEGATIVE   Leukocytes, UA LARGE (A) NEGATIVE  Urine microscopic-add on  Result Value Ref Range   Squamous Epithelial / LPF RARE RARE   WBC, UA 21-50 <3 WBC/hpf   Bacteria, UA MANY (A) RARE   Crystals TRIPLE PHOSPHATE CRYSTALS (A) NEGATIVE  I-Stat CG4 Lactic Acid, ED  (not at [facility])  Result Value Ref Range   Lactic Acid, Venous 1.71 0.5 - 2.0 mmol/L   Imaging Review Dg Chest Port 1 View  09/30/2014   CLINICAL DATA:  Fever and dyspnea  EXAM: PORTABLE CHEST - 1 VIEW  COMPARISON:  07/18/2014  FINDINGS: There is left base consolidation consistent with pneumonia. The right  lung is clear. There may be a small left effusion.  Heart size is normal and unchanged. There are intact appearances of the transvenous leads.  IMPRESSION: Left base consolidation consistent with pneumonia.   Electronically Signed   By: Andreas Newport M.D.   On: 09/30/2014 00:25   Images viewed by me.  MDM   Final diagnoses:  Fever  HCAP (healthcare-associated pneumonia)  Sacral decubitus ulcer, unstageable  Acute kidney injury (nontraumatic)  Prerenal azotemia  Hypernatremia  Normochromic anemia  Dementia with behavioral disturbance    Fever and altered mental status. Main finding is large sacral decubitus. She needs to be screened for occult pneumonia and urinary tract infection. Old records are reviewed and she had a similar presentation 3 months ago at which time she was found to be hypernatremic. Electrodes will be checked.  Chest x-ray appears to show left retrocardiac infiltrate and she started on antibiotics for HCAP of vancomycin and Zosyn. Electrolytes do show slight elevation of sodium as well as evidence of dehydration with acute kidney injury. Anemia is present which have been present and time of her recent admission but hemoglobin has dropped slightly. Case is discussed with Dr. Hal Hope of [facility] who agrees to admit the patient.  Delora Fuel, MD 15/40/08 [id number]

## 2014-09-29 NOTE — ED Notes (Signed)
Pt comes from home via [facility], family states pt is normally not verbal, but more lethargic for the past day, not eating since Saturday, pt is diabetic, pt is on Hospice.

## 2014-09-30 ENCOUNTER — Inpatient Hospital Stay (HOSPITAL_COMMUNITY)

## 2014-09-30 ENCOUNTER — Encounter (HOSPITAL_COMMUNITY): Payer: Self-pay | Admitting: Emergency Medicine

## 2014-09-30 DIAGNOSIS — Z950 Presence of cardiac pacemaker: Secondary | ICD-10-CM | POA: Diagnosis not present

## 2014-09-30 DIAGNOSIS — N179 Acute kidney failure, unspecified: Secondary | ICD-10-CM | POA: Diagnosis not present

## 2014-09-30 DIAGNOSIS — G9340 Encephalopathy, unspecified: Secondary | ICD-10-CM

## 2014-09-30 DIAGNOSIS — Z79899 Other long term (current) drug therapy: Secondary | ICD-10-CM | POA: Diagnosis not present

## 2014-09-30 DIAGNOSIS — J690 Pneumonitis due to inhalation of food and vomit: Secondary | ICD-10-CM | POA: Diagnosis not present

## 2014-09-30 DIAGNOSIS — L89612 Pressure ulcer of right heel, stage 2: Secondary | ICD-10-CM | POA: Diagnosis present

## 2014-09-30 DIAGNOSIS — F0280 Dementia in other diseases classified elsewhere without behavioral disturbance: Secondary | ICD-10-CM | POA: Diagnosis present

## 2014-09-30 DIAGNOSIS — D649 Anemia, unspecified: Secondary | ICD-10-CM | POA: Diagnosis present

## 2014-09-30 DIAGNOSIS — E876 Hypokalemia: Secondary | ICD-10-CM | POA: Diagnosis not present

## 2014-09-30 DIAGNOSIS — R627 Adult failure to thrive: Secondary | ICD-10-CM | POA: Diagnosis not present

## 2014-09-30 DIAGNOSIS — I480 Paroxysmal atrial fibrillation: Secondary | ICD-10-CM | POA: Diagnosis present

## 2014-09-30 DIAGNOSIS — E1165 Type 2 diabetes mellitus with hyperglycemia: Secondary | ICD-10-CM | POA: Diagnosis present

## 2014-09-30 DIAGNOSIS — Z515 Encounter for palliative care: Secondary | ICD-10-CM | POA: Diagnosis not present

## 2014-09-30 DIAGNOSIS — I10 Essential (primary) hypertension: Secondary | ICD-10-CM | POA: Diagnosis present

## 2014-09-30 DIAGNOSIS — Y95 Nosocomial condition: Secondary | ICD-10-CM | POA: Diagnosis present

## 2014-09-30 DIAGNOSIS — Z888 Allergy status to other drugs, medicaments and biological substances status: Secondary | ICD-10-CM | POA: Diagnosis not present

## 2014-09-30 DIAGNOSIS — Z820 Family history of epilepsy and other diseases of the nervous system: Secondary | ICD-10-CM | POA: Diagnosis not present

## 2014-09-30 DIAGNOSIS — D696 Thrombocytopenia, unspecified: Secondary | ICD-10-CM | POA: Diagnosis present

## 2014-09-30 DIAGNOSIS — L89154 Pressure ulcer of sacral region, stage 4: Secondary | ICD-10-CM | POA: Diagnosis present

## 2014-09-30 DIAGNOSIS — Z794 Long term (current) use of insulin: Secondary | ICD-10-CM | POA: Diagnosis not present

## 2014-09-30 DIAGNOSIS — F0391 Unspecified dementia with behavioral disturbance: Secondary | ICD-10-CM | POA: Diagnosis not present

## 2014-09-30 DIAGNOSIS — J189 Pneumonia, unspecified organism: Secondary | ICD-10-CM

## 2014-09-30 DIAGNOSIS — I2510 Atherosclerotic heart disease of native coronary artery without angina pectoris: Secondary | ICD-10-CM | POA: Diagnosis present

## 2014-09-30 DIAGNOSIS — R1310 Dysphagia, unspecified: Secondary | ICD-10-CM | POA: Diagnosis present

## 2014-09-30 DIAGNOSIS — Z833 Family history of diabetes mellitus: Secondary | ICD-10-CM | POA: Diagnosis not present

## 2014-09-30 DIAGNOSIS — N390 Urinary tract infection, site not specified: Secondary | ICD-10-CM | POA: Diagnosis present

## 2014-09-30 DIAGNOSIS — Z7901 Long term (current) use of anticoagulants: Secondary | ICD-10-CM | POA: Diagnosis not present

## 2014-09-30 DIAGNOSIS — E870 Hyperosmolality and hypernatremia: Secondary | ICD-10-CM | POA: Diagnosis not present

## 2014-09-30 DIAGNOSIS — E860 Dehydration: Secondary | ICD-10-CM | POA: Diagnosis present

## 2014-09-30 DIAGNOSIS — Z7401 Bed confinement status: Secondary | ICD-10-CM | POA: Diagnosis not present

## 2014-09-30 DIAGNOSIS — G309 Alzheimer's disease, unspecified: Secondary | ICD-10-CM | POA: Diagnosis present

## 2014-09-30 DIAGNOSIS — E43 Unspecified severe protein-calorie malnutrition: Secondary | ICD-10-CM | POA: Diagnosis present

## 2014-09-30 DIAGNOSIS — I739 Peripheral vascular disease, unspecified: Secondary | ICD-10-CM | POA: Diagnosis present

## 2014-09-30 DIAGNOSIS — Z66 Do not resuscitate: Secondary | ICD-10-CM | POA: Diagnosis present

## 2014-09-30 DIAGNOSIS — Z86718 Personal history of other venous thrombosis and embolism: Secondary | ICD-10-CM | POA: Diagnosis not present

## 2014-09-30 HISTORY — DX: Pneumonia, unspecified organism: J18.9

## 2014-09-30 LAB — URINE MICROSCOPIC-ADD ON

## 2014-09-30 LAB — COMPREHENSIVE METABOLIC PANEL
ALBUMIN: 1.8 g/dL — AB (ref 3.5–5.0)
ALK PHOS: 91 U/L (ref 38–126)
ALT: 19 U/L (ref 14–54)
ALT: 19 U/L (ref 14–54)
AST: 21 U/L (ref 15–41)
AST: 26 U/L (ref 15–41)
Albumin: 1.8 g/dL — ABNORMAL LOW (ref 3.5–5.0)
Alkaline Phosphatase: 103 U/L (ref 38–126)
Anion gap: 11 (ref 5–15)
Anion gap: 8 (ref 5–15)
BUN: 39 mg/dL — ABNORMAL HIGH (ref 6–20)
BUN: 45 mg/dL — AB (ref 6–20)
CALCIUM: 7.7 mg/dL — AB (ref 8.9–10.3)
CO2: 23 mmol/L (ref 22–32)
CO2: 23 mmol/L (ref 22–32)
Calcium: 7.7 mg/dL — ABNORMAL LOW (ref 8.9–10.3)
Chloride: 113 mmol/L — ABNORMAL HIGH (ref 101–111)
Chloride: 117 mmol/L — ABNORMAL HIGH (ref 101–111)
Creatinine, Ser: 1.26 mg/dL — ABNORMAL HIGH (ref 0.44–1.00)
Creatinine, Ser: 0.97 mg/dL (ref 0.44–1.00)
GFR calc Af Amer: 48 mL/min — ABNORMAL LOW (ref >60)
GFR calc non Af Amer: 41 mL/min — ABNORMAL LOW (ref >60)
GFR calc non Af Amer: 57 mL/min — ABNORMAL LOW (ref >60)
GLUCOSE: 280 mg/dL — AB (ref 65–99)
GLUCOSE: 292 mg/dL — AB (ref 65–99)
POTASSIUM: 3.2 mmol/L — AB (ref 3.5–5.1)
Potassium: 2.9 mmol/L — ABNORMAL LOW (ref 3.5–5.1)
SODIUM: 148 mmol/L — AB (ref 135–145)
Sodium: 147 mmol/L — ABNORMAL HIGH (ref 135–145)
TOTAL PROTEIN: 5.6 g/dL — AB (ref 6.5–8.1)
Total Bilirubin: 1.2 mg/dL (ref 0.3–1.2)
Total Bilirubin: 0.9 mg/dL (ref 0.3–1.2)
Total Protein: 5.8 g/dL — ABNORMAL LOW (ref 6.5–8.1)

## 2014-09-30 LAB — GLUCOSE, CAPILLARY
GLUCOSE-CAPILLARY: 195 mg/dL — AB (ref 65–99)
Glucose-Capillary: 249 mg/dL — ABNORMAL HIGH (ref 65–99)
Glucose-Capillary: 239 mg/dL — ABNORMAL HIGH (ref 65–99)
Glucose-Capillary: 220 mg/dL — ABNORMAL HIGH (ref 65–99)
Glucose-Capillary: 224 mg/dL — ABNORMAL HIGH (ref 65–99)

## 2014-09-30 LAB — CBC WITH DIFFERENTIAL/PLATELET
BASOS ABS: 0 10*3/uL (ref 0.0–0.1)
Basophils Absolute: 0 10*3/uL (ref 0.0–0.1)
Basophils Relative: 0 % (ref 0–1)
Basophils Relative: 0 % (ref 0–1)
EOS ABS: 0 10*3/uL (ref 0.0–0.7)
EOS PCT: 0 % (ref 0–5)
Eosinophils Absolute: 0 10*3/uL (ref 0.0–0.7)
Eosinophils Relative: 0 % (ref 0–5)
HCT: 25.3 % — ABNORMAL LOW (ref 36.0–46.0)
HCT: 28.7 % — ABNORMAL LOW (ref 36.0–46.0)
Hemoglobin: 7.9 g/dL — ABNORMAL LOW (ref 12.0–15.0)
Hemoglobin: 8.7 g/dL — ABNORMAL LOW (ref 12.0–15.0)
LYMPHS ABS: 1.7 10*3/uL (ref 0.7–4.0)
LYMPHS PCT: 13 % (ref 12–46)
Lymphocytes Relative: 15 % (ref 12–46)
Lymphs Abs: 1.4 10*3/uL (ref 0.7–4.0)
MCH: 26.1 pg (ref 26.0–34.0)
MCH: 26.2 pg (ref 26.0–34.0)
MCHC: 31.2 g/dL (ref 30.0–36.0)
MCHC: 30.3 g/dL (ref 30.0–36.0)
MCV: 86.2 fL (ref 78.0–100.0)
MCV: 84.1 fL (ref 78.0–100.0)
MONOS PCT: 10 % (ref 3–12)
Monocytes Absolute: 1.1 10*3/uL — ABNORMAL HIGH (ref 0.1–1.0)
Monocytes Absolute: 0.8 10*3/uL (ref 0.1–1.0)
Monocytes Relative: 8 % (ref 3–12)
NEUTROS PCT: 75 % (ref 43–77)
Neutro Abs: 8.4 10*3/uL — ABNORMAL HIGH (ref 1.7–7.7)
Neutro Abs: 8.3 10*3/uL — ABNORMAL HIGH (ref 1.7–7.7)
Neutrophils Relative %: 79 % — ABNORMAL HIGH (ref 43–77)
Platelets: 199 10*3/uL (ref 150–400)
Platelets: 203 10*3/uL (ref 150–400)
RBC: 3.33 MIL/uL — ABNORMAL LOW (ref 3.87–5.11)
RBC: 3.01 MIL/uL — AB (ref 3.87–5.11)
RDW: 15.3 % (ref 11.5–15.5)
RDW: 15.3 % (ref 11.5–15.5)
WBC: 11.2 10*3/uL — ABNORMAL HIGH (ref 4.0–10.5)
WBC: 10.5 10*3/uL (ref 4.0–10.5)

## 2014-09-30 LAB — URINALYSIS, ROUTINE W REFLEX MICROSCOPIC
Glucose, UA: 100 mg/dL — AB
HGB URINE DIPSTICK: NEGATIVE
Ketones, ur: 15 mg/dL — AB
NITRITE: POSITIVE — AB
PH: 8 (ref 5.0–8.0)
Protein, ur: >300 mg/dL — AB
Specific Gravity, Urine: 1.024 (ref 1.005–1.030)
Urobilinogen, UA: 1 mg/dL (ref 0.0–1.0)

## 2014-09-30 LAB — TYPE AND SCREEN
ABO/RH(D): A POS
Antibody Screen: NEGATIVE

## 2014-09-30 LAB — I-STAT CG4 LACTIC ACID, ED: LACTIC ACID, VENOUS: 1.71 mmol/L (ref 0.5–2.0)

## 2014-09-30 LAB — CBG MONITORING, ED: GLUCOSE-CAPILLARY: 287 mg/dL — AB (ref 65–99)

## 2014-09-30 LAB — ABO/RH: ABO/RH(D): A POS

## 2014-09-30 MED ORDER — ACETAMINOPHEN 325 MG PO TABS: 650.0000 mg | ORAL_TABLET | Freq: Four times a day (QID) | ORAL | Status: DC | PRN

## 2014-09-30 MED ORDER — VANCOMYCIN HCL 500 MG IV SOLR
500.0000 mg | INTRAVENOUS | Status: DC
  Administered 2014-10-01: 500 mg via INTRAVENOUS
  Filled 2014-09-30: qty 500

## 2014-09-30 MED ORDER — DEXTROSE 5 % IV SOLN
1.0000 g | Freq: Two times a day (BID) | INTRAVENOUS | Status: DC
  Administered 2014-09-30 – 2014-10-05 (×11): 1 g via INTRAVENOUS
  Filled 2014-09-30 (×12): qty 1

## 2014-09-30 MED ORDER — SODIUM CHLORIDE 0.45 % IV SOLN
INTRAVENOUS | Status: AC
  Administered 2014-09-30 (×2): via INTRAVENOUS

## 2014-09-30 MED ORDER — INSULIN ASPART 100 UNIT/ML ~~LOC~~ SOLN
0.0000 [IU] | Freq: Three times a day (TID) | SUBCUTANEOUS | Status: DC
  Administered 2014-09-30: 3 [IU] via SUBCUTANEOUS

## 2014-09-30 MED ORDER — ACETAMINOPHEN 650 MG RE SUPP: 650.0000 mg | Freq: Four times a day (QID) | RECTAL | Status: DC | PRN

## 2014-09-30 MED ORDER — VANCOMYCIN HCL IN DEXTROSE 1-5 GM/200ML-% IV SOLN
1000.0000 mg | Freq: Once | INTRAVENOUS | Status: AC
  Administered 2014-09-30: 1000 mg via INTRAVENOUS
  Filled 2014-09-30: qty 200

## 2014-09-30 MED ORDER — INSULIN DETEMIR 100 UNIT/ML ~~LOC~~ SOLN
8.0000 [IU] | Freq: Every evening | SUBCUTANEOUS | Status: DC
  Filled 2014-09-30: qty 0.08

## 2014-09-30 MED ORDER — ONDANSETRON HCL 4 MG/2ML IJ SOLN: 4.0000 mg | Freq: Four times a day (QID) | INTRAMUSCULAR | Status: DC | PRN

## 2014-09-30 MED ORDER — MORPHINE SULFATE (CONCENTRATE) 10 MG/0.5ML PO SOLN: 10.0000 mg | ORAL | Status: DC | PRN

## 2014-09-30 MED ORDER — LORAZEPAM 0.5 MG PO TABS: 1.0000 mg | ORAL_TABLET | Freq: Four times a day (QID) | ORAL | Status: DC | PRN

## 2014-09-30 MED ORDER — APIXABAN 5 MG PO TABS: 5.0000 mg | ORAL_TABLET | Freq: Two times a day (BID) | ORAL | Status: DC

## 2014-09-30 MED ORDER — MEMANTINE HCL 10 MG PO TABS
10.0000 mg | ORAL_TABLET | Freq: Two times a day (BID) | ORAL | Status: DC
  Filled 2014-09-30 (×2): qty 1

## 2014-09-30 MED ORDER — ONDANSETRON HCL 4 MG PO TABS: 4.0000 mg | ORAL_TABLET | Freq: Four times a day (QID) | ORAL | Status: DC | PRN

## 2014-09-30 MED ORDER — VANCOMYCIN HCL IN DEXTROSE 1-5 GM/200ML-% IV SOLN: 1000.0000 mg | INTRAVENOUS | Status: DC

## 2014-09-30 MED ORDER — PIPERACILLIN-TAZOBACTAM 3.375 G IVPB 30 MIN
3.3750 g | Freq: Once | INTRAVENOUS | Status: AC
  Administered 2014-09-30: 3.375 g via INTRAVENOUS
  Filled 2014-09-30: qty 50

## 2014-09-30 MED ORDER — ENSURE ENLIVE PO LIQD: 237.0000 mL | Freq: Two times a day (BID) | ORAL | Status: DC

## 2014-09-30 MED ORDER — QUETIAPINE FUMARATE 50 MG PO TABS: 50.0000 mg | ORAL_TABLET | Freq: Every day | ORAL | Status: DC

## 2014-09-30 NOTE — Care Management Note (Signed)
Case Management Note  Patient Details  Name: TICARA WANER MRN: [id number] Date of Birth: Aug 02, 1940  Subjective/Objective:                    Action/Plan:  Awaiting palliative care consult . Patient from home with Hospice and [place]   Expected Discharge Date:     10-03-14              Expected Discharge Plan:     In-House Referral:     Discharge planning Services     Post Acute Care Choice:    Choice offered to:     DME Arranged:    DME Agency:     HH Arranged:    HH Agency:     Status of Service:  In process, will continue to follow  Medicare Important Message Given:    Date Medicare IM Given:    Medicare IM give by:    Date Additional Medicare IM Given:    Additional Medicare Important Message give by:     If discussed at [place] of Stay Meetings, dates discussed:    Additional Comments:  Marilu Favre, RN 09/30/2014, 10:41 AM

## 2014-09-30 NOTE — H&P (Signed)
[facility] History and Physical  [name] ENI:[id number] DOB: 08-Jul-1940 DOA: 09/29/2014  Referring physician: Dr.Glick. PCP: Unice Cobble, MD  Specialists: None.  Chief Complaint: Poor oral intake and altered mental status.  History obtained from patient's daughter Ms. Patsey Berthold.  HPI: [name] is a 74 y.o. female history of advanced dementia, chronic sacral decubitus ulcers, paroxysmal atrial fibrillation on Apixaban, diabetes mellitus, chronic anemia and thrombocytopenia was brought to the ER after patient was altered mental status and not eating well. As per the patient's daughter patient was found to have hematuria last week which slowly cleared by itself. Since last 3 days patient has not been eating well. Patient was brought to the ER and was found to be dehydrated with worsening renal function and hypernatremia. Patient's chest x-ray shows possible pneumonia and UA shows possible UTI. Patient has been started on hydration and empiric antibiotics and admitted for further management. Patient does have a history of chronic anemia which is mildly worsening this time. Patient's sacral level but also had mild discharge on admission. On exam sacral decubitus ulcer looks little necrotic. Patient is under hospice care and is a DO NOT RESUSCITATE.   Review of Systems: As presented in the history of presenting illness, rest negative.  Past Medical History  Diagnosis Date  . Dysphagia   . Diabetes mellitus   . Alzheimer disease   . Hypokalemia   . Anemia   . 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 DVT (deep venous thrombosis)     [facility]  . Hypertension   . Abnormality of gait 06/13/2014  . Coronary artery disease   . Presence of permanent cardiac pacemaker   . Peripheral vascular disease   . Chronic kidney disease   . Seizures   . Medical history non-contributory   . HCAP (healthcare-associated pneumonia) 09/30/2014   Past Surgical History  Procedure Laterality  Date  . Syncope  05/2001    pacer  . Pacemaker placement    . Insert / replace / remove pacemaker    . No past surgeries     Social History:  reports that she has never smoked. She does not have any smokeless tobacco history on file. She reports that she does not drink alcohol or use illicit drugs. Where does patient live home. Can patient participate in ADLs? No.  Allergies  Allergen Reactions  . Ace Inhibitors Cough    Family History:  Family History  Problem Relation Age of Onset  . Alzheimer's disease    . Diabetes    . Stroke Father   . Diabetes Brother   . Seizures Brother   . Diabetes Brother       Prior to Admission medications   Medication Sig Start Date End Date Taking? Authorizing Provider  apixaban (ELIQUIS) 5 MG TABS tablet Take 1 tablet (5 mg total) by mouth 2 (two) times daily. Call your PCP and get future refills from PCP 10 days before this medication supply ends 07/25/14   Thurnell Lose, MD  Blood Glucose Monitoring Suppl (ONE TOUCH ULTRA 2) W/DEVICE KIT Use to test blood sugars before each meal and at bedtime so four times daily ICD 10 E11 59 06/09/14   Hendricks Limes, MD  feeding supplement, ENSURE ENLIVE, (ENSURE ENLIVE) LIQD Take 237 mLs by mouth 2 (two) times daily between meals. 07/25/14   Thurnell Lose, MD  glucose blood test strip ONETOUCH ULTRA ULTRA TEST STRIPS Use to test blood sugar before each meal  and at bedtime so 4 times daily ICD 10 E11 59 06/09/14   Hendricks Limes, MD  Insulin Detemir (LEVEMIR FLEXPEN) 100 UNIT/ML Pen Inject 8 Units into the skin every evening. 15 units each evening 07/25/14   Thurnell Lose, MD  insulin lispro (HUMALOG) 100 UNIT/ML injection Use only if blood sugar is over 200. 07/25/14   Thurnell Lose, MD  LORazepam (ATIVAN) 2 MG/ML concentrated solution Take 0.5 mLs (1 mg total) by mouth every 6 (six) hours as needed for anxiety. 07/25/14   Thurnell Lose, MD  memantine (NAMENDA) 10 MG tablet Take 1 tablet (10 mg  total) by mouth 2 (two) times daily. 05/12/14   Hendricks Limes, MD  Morphine Sulfate (MORPHINE CONCENTRATE) 10 MG/0.5ML SOLN concentrated solution Take 0.5 mLs (10 mg total) by mouth every 3 (three) hours as needed for moderate pain or severe pain. 07/25/14   Thurnell Lose, MD  [facility] DELICA LANCETS 56L MISC Use to test blood sugar before each meal and at bedtime so four times dialy ICD 10 E11 59 06/09/14   Hendricks Limes, MD  potassium chloride SA (K-DUR,KLOR-CON) 20 MEQ tablet Take 1 tablet (20 mEq total) by mouth 2 (two) times daily. 01/31/14   Hendricks Limes, MD  QUEtiapine (SEROQUEL) 50 MG tablet TAKE 1 TABLET (50 MG TOTAL) BY MOUTH AT BEDTIME. Patient taking differently: TAKE 1 TABLET (50 MG TOTAL) BY MOUTH as needed AT BEDTIME. 06/09/14   Hendricks Limes, MD    Physical Exam: Filed Vitals:   09/30/14 0130 09/30/14 0200 09/30/14 0230 09/30/14 0300  BP: 119/67 116/69 113/64 106/62  Pulse: 78 77 76 74  Temp:      TempSrc:      Resp: $Remo'19 17 16 18  'NqlUa$ Height:      Weight:      SpO2: 100% 100% 100% 100%     General:  Moderately built and poorly nourished.  Eyes: Anicteric no pallor.  ENT: No discharge from the ears eyes nose and mouth.  Neck: No mass felt.  Cardiovascular: S1-S2 heard.  Respiratory: No rhonchi or crepitations.  Abdomen: Soft nontender bowel sounds present.  Skin: Patient has multiple ulcers on the lower extremities and large sacral decubitus ulcer which necrotic base.  Musculoskeletal: No edema.  Psychiatric: Patient does not respond to commands.  Neurologic: Patient tries to resist opening her eyes. Does not follow commands.  Labs on Admission:  Basic Metabolic Panel:  Recent Labs Lab 09/30/14 0012  NA 147*  K 3.2*  CL 113*  CO2 23  GLUCOSE 292*  BUN 45*  CREATININE 1.26*  CALCIUM 7.7*   Liver Function Tests:  Recent Labs Lab 09/30/14 0012  AST 26  ALT 19  ALKPHOS 103  BILITOT 1.2  PROT 5.6*  ALBUMIN 1.8*   No results for  input(s): LIPASE, AMYLASE in the last 168 hours. No results for input(s): AMMONIA in the last 168 hours. CBC:  Recent Labs Lab 09/30/14 0012  WBC 11.2*  NEUTROABS 8.4*  HGB 7.9*  HCT 25.3*  MCV 84.1  PLT 199   Cardiac Enzymes: No results for input(s): CKTOTAL, CKMB, CKMBINDEX, TROPONINI in the last 168 hours.  BNP (last 3 results) No results for input(s): BNP in the last 8760 hours.  ProBNP (last 3 results) No results for input(s): PROBNP in the last 8760 hours.  CBG:  Recent Labs Lab 09/30/14 0358  GLUCAP 287*    Radiological Exams on Admission: Dg Chest [facility]  09/30/2014   CLINICAL DATA:  Fever and dyspnea  EXAM: PORTABLE CHEST - 1 VIEW  COMPARISON:  07/18/2014  FINDINGS: There is left base consolidation consistent with pneumonia. The right lung is clear. There may be a small left effusion.  Heart size is normal and unchanged. There are intact appearances of the transvenous leads.  IMPRESSION: Left base consolidation consistent with pneumonia.   Electronically Signed   By: Andreas Newport M.D.   On: 09/30/2014 00:25     Assessment/Plan Principal Problem:   Acute encephalopathy Active Problems:   Essential hypertension, benign   Hypernatremia   AKI (acute kidney injury)   HCAP (healthcare-associated pneumonia)   Decubitus ulcer of sacral region, stage 4   Chronic anemia   1. Acute encephalopathy - most likely multifactorial including metabolic infectious. CT head is pending. At this time patient has been placed on empiric antibiotics for healthcare associated pneumonia and UTI. Patient also has renal failure with hypernatremia which could be also contributing to patient's mental status changes. 2. Acute renal failure with hypernatremia probably from poor oral intake and dehydration - gently hydrate him closely follow metabolic panel. 3. Pneumonia and UTI - see #1. 4. Sacral decubitus ulcer and heel ulcer - wound team consult. 5. Chronic anemia with  worsening hemoglobin - patient has had recent hematuria. Follow CBC type and screen. Transfuse if needed. 6. Dementia - continue home medications. 7. Diabetes mellitus type 2 - continue medications. 8. Paroxysmal atrial fibrillation presently rate controlled - patient is on Apixaban. If patient's mental status does not improve may need to change to heparin.  I have discussed patient's daughter and at this time plan is to do minimal labs and try to keep patient as comfortable as possible. I have ordered a repeat metabolic panel and CBC and if there is no significant changes then just continue with IV fluids antibiotics and I have consulted hospice. Patient is a DO NOT RESUSCITATE.   DVT Prophylaxis Lovenox.  Code Status: DO NOT RESUSCITATE.  Family Communication: Patient's daughter.  Disposition Plan: Admit to inpatient.    Kasper Mudrick N. [facility] Pager [phone].  If 7PM-7AM, please contact night-coverage [email or web address] Password [email or web address] 09/30/2014, 5:01 AM

## 2014-09-30 NOTE — Progress Notes (Signed)
Inpatient Diabetes Program Recommendations  AACE/ADA: New Consensus Statement on Inpatient Glycemic Control (2013)  Target Ranges:  Prepandial:   less than 140 mg/dL      Peak postprandial:   less than 180 mg/dL (1-2 hours)      Critically ill patients:  140 - 180 mg/dL   Results for JODE, LIPPE (MRN [id number]) as of 09/30/2014 13:40  Ref. Range 09/30/2014 03:58 09/30/2014 07:38 09/30/2014 12:16  Glucose-Capillary Latest Ref Range: 65-99 mg/dL 287 (H) 249 (H) 239 (H)   Reason for assessment: elevated CBG  Diabetes history: Type 2  Current orders for Inpatient glycemic control: none  Please consider adding Novolog moderate correction scale 0-15 units tid since we're checking blood sugars tid and the blood sugars are elevated.  May also want to consider adding Lantus 5 units q day- fasting CBG    Gentry Fitz, RN, [place], [place], CDE Diabetes Coordinator Inpatient Diabetes Program  [phone] (Team Pager) [phone] (Geraldine) 09/30/2014 1:41 PM

## 2014-09-30 NOTE — Progress Notes (Signed)
Nutrition Brief Note  Chart reviewed. Pt likely transitioning to comfort care. Spoke with RN who reports pt is waiting on palliative consult; she reports pt is unresponsive and unable to to take PO meds. She is already followed by [facility].  No further nutrition interventions warranted at this time.  Please re-consult as needed.   Brookelynn Hamor A. Jimmye Norman, RD, LDN, CDE Pager: [phone] After hours Pager: [phone]

## 2014-09-30 NOTE — Progress Notes (Addendum)
[facility] 6N Rm 27-[facility] [place] RN Visit- Barbra Sarks RN . Patient brought to hospital via EMS last evening a few hours after [facility] RN came out to assess for sx of a UTI. Family per [employer] note, requested patient come to the hospital for treatment with IV ABX. This is a related and covered  admission to pt's hospice dx of Alzheimer's Dementia. Patient lying on her back and is unresponsive to name and to touch. Phlebotomist came in for a blood draw and pt. did not respond at all. No family currently in the room. Patient on O2 at 2 L n/c. Resps regular and no audible wheezing heard. Code Status is DNR.  Transfer summary and med list placed on pt's shadow chart. Hospice will continue to follow daily. Please call with any hospice concerns.  [facility] Liaison [phone]

## 2014-09-30 NOTE — Progress Notes (Addendum)
11:25 AM I agree with HPI/GPe and A/P per Dr. Hal Hope      BP 100/60 mmHg  Pulse 73  Temp(Src) 99 F (37.2 C) (Axillary)  Resp 19  Ht 5\' 5"  (1.651 m)  Wt 49.896 kg (110 lb)  BMI 18.31 kg/m2  SpO2 100%  Unresponsive, sweaty, GCS below 10 No family+ Apparently was sent to ED per family wishes although on Hospsice at home?? Appreciate Palliative care medicine assistance in delineating care  Patient Active Problem List   Diagnosis Date Noted  . HCAP (healthcare-associated pneumonia) 09/30/2014  . Decubitus ulcer of sacral region, stage 4 09/30/2014  . Chronic anemia 09/30/2014  . Acute kidney injury (nontraumatic)   . Weakness generalized 07/25/2014  . Dementia with behavioral disturbance 07/25/2014  . DNR (do not resuscitate)   . Hypoglycemia   . Acute renal failure syndrome   . Urinary tract infectious disease   . Metabolic encephalopathy   . Thrombocythemia   . DVT (deep venous thrombosis)   . Altered mental state 07/18/2014  . Abnormality of gait 06/13/2014  . Type II or unspecified type diabetes mellitus with peripheral circulatory disorders, uncontrolled(250.72) 12/04/2013  . Heel ulcer 11/24/2013  . AKI (acute kidney injury) 11/24/2013  . Protein-calorie malnutrition, severe 08/08/2013  . Acute encephalopathy 08/07/2013  . Hypernatremia 08/06/2013  . ARF (acute renal failure) 08/06/2013  . Sepsis 08/06/2013  . UTI (lower urinary tract infection) 08/06/2013  . Palliative care encounter 08/06/2013  . Hyperosmolar (nonketotic) coma 08/06/2013  . Non-compliance 07/24/2013  . Encounter for therapeutic drug monitoring 05/07/2013  . Paroxysmal atrial fibrillation 04/22/2013  . Syncope 04/20/2013  . 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 Long term current use of anticoagulant 06/02/2010  . DYSLIPIDEMIA 10/06/2009  . Essential hypertension, benign 10/06/2009  . CARDIAC PACEMAKER IN SITU 10/06/2009  . Type II or unspecified type diabetes mellitus with neurological  manifestations, uncontrolled 07/21/2008  . DYSPHAGIA, UNSPECIFIED 12/15/2006  . ANEMIA-NOS 08/29/2006  . Alzheimer's disease 08/29/2006

## 2014-09-30 NOTE — Progress Notes (Signed)
Received patient at 0630am from ED via stretcher

## 2014-09-30 NOTE — Progress Notes (Addendum)
ANTIBIOTIC CONSULT NOTE - INITIAL  Pharmacy Consult for Fortaz and Vancomycin  Indication: rule out pneumonia  Allergies  Allergen Reactions  . Ace Inhibitors Cough    Patient Measurements: Height: 5\' 5"  (349.1 cm) Weight: 110 lb (49.896 kg) IBW/kg (Calculated) : 57  Vital Signs: Temp: 99 F (37.2 C) (06/28 0609) Temp Source: Axillary (06/28 0609) BP: 100/60 mmHg (06/28 0600) Pulse Rate: 73 (06/28 0600) Intake/Output from previous day:   Intake/Output from this shift:    Labs:  Recent Labs  09/30/14 0012  WBC 11.2*  HGB 7.9*  PLT 199  CREATININE 1.26*   Estimated Creatinine Clearance: 31.3 mL/min (by C-G formula based on Cr of 1.26). No results for input(s): VANCOTROUGH, VANCOPEAK, VANCORANDOM, GENTTROUGH, GENTPEAK, GENTRANDOM, TOBRATROUGH, TOBRAPEAK, TOBRARND, AMIKACINPEAK, AMIKACINTROU, AMIKACIN in the last 72 hours.   Microbiology: No results found for this or any previous visit (from the past 720 hour(s)).  Medical History: Past Medical History  Diagnosis Date  . Dysphagia   . Diabetes mellitus   . Alzheimer disease   . Hypokalemia   . Anemia   . 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 DVT (deep venous thrombosis)     [facility]  . Hypertension   . Abnormality of gait 06/13/2014  . Coronary artery disease   . Presence of permanent cardiac pacemaker   . Peripheral vascular disease   . Chronic kidney disease   . Seizures   . Medical history non-contributory   . HCAP (healthcare-associated pneumonia) 09/30/2014    Medications:  Prescriptions prior to admission  Medication Sig Dispense Refill Last Dose  . apixaban (ELIQUIS) 5 MG TABS tablet Take 1 tablet (5 mg total) by mouth 2 (two) times daily. Call your PCP and get future refills from PCP 10 days before this medication supply ends 60 tablet 0   . Blood Glucose Monitoring Suppl (ONE TOUCH ULTRA 2) W/DEVICE KIT Use to test blood sugars before each meal and at bedtime so four times daily ICD 10 E11  59 1 each 0 Taking  . feeding supplement, ENSURE ENLIVE, (ENSURE ENLIVE) LIQD Take 237 mLs by mouth 2 (two) times daily between meals. 237 mL 12   . glucose blood test strip ONETOUCH ULTRA ULTRA TEST STRIPS Use to test blood sugar before each meal and at bedtime so 4 times daily ICD 10 E11 59 100 each 3 Taking  . Insulin Detemir (LEVEMIR FLEXPEN) 100 UNIT/ML Pen Inject 8 Units into the skin every evening. 15 units each evening 15 mL 11   . insulin lispro (HUMALOG) 100 UNIT/ML injection Use only if blood sugar is over 200. 10 mL 11   . LORazepam (ATIVAN) 2 MG/ML concentrated solution Take 0.5 mLs (1 mg total) by mouth every 6 (six) hours as needed for anxiety. 30 mL 0   . memantine (NAMENDA) 10 MG tablet Take 1 tablet (10 mg total) by mouth 2 (two) times daily. 60 tablet 2 07/17/2014 at Unknown time  . Morphine Sulfate (MORPHINE CONCENTRATE) 10 MG/0.5ML SOLN concentrated solution Take 0.5 mLs (10 mg total) by mouth every 3 (three) hours as needed for moderate pain or severe pain. 30 mL 0   . ONETOUCH DELICA LANCETS 79X MISC Use to test blood sugar before each meal and at bedtime so four times dialy ICD 10 E11 59 100 each 3 Taking  . potassium chloride SA (K-DUR,KLOR-CON) 20 MEQ tablet Take 1 tablet (20 mEq total) by mouth 2 (two) times daily. 60 tablet 5 07/17/2014 at Unknown time  .  QUEtiapine (SEROQUEL) 50 MG tablet TAKE 1 TABLET (50 MG TOTAL) BY MOUTH AT BEDTIME. (Patient taking differently: TAKE 1 TABLET (50 MG TOTAL) BY MOUTH as needed AT BEDTIME.) 30 tablet 0 Past Week at Unknown time   [name], possible PNA, for empiric antibiotics  Vancomycin 1 g IV given in ED at 0030   Goal of Therapy:  Vancomycin trough level 15-20 mcg/ml  Plan:  Vancomycin 1 g IIV q48h Fortaz 1 g IV q12h  Abbott, Gregory Vernon 09/30/2014,6:53 AM  ADDENDUM:  SCr 1.26, CrCl ~71ml/min.   Plan: Continue ceftazidime 1g IV Q12 Change vancomycin to $RemoveBefor'500mg'[id number]$  IV Q24 starting  tomorrow Monitor clinical picture, renal function, C&S F/U abx deescalation

## 2014-09-30 NOTE — Consult Note (Signed)
WOC wound consult note Reason for Consult: Chronic right heel ulcer, present on admission. Chronic unstageable pressure ulcer to sacrum, present on admission.  Patient is under Hospice care and per MD notes, the daughter's wishes are antibiotic therapy and comfort measures only.  Patient is a DNR.  Wound type: Chronic pressure ulcers Pressure Ulcer POA: Yes Measurement: Right heel Stage II 1 cm x 1 cm x 0.1 cm Left plantar foot, 0.5 cm x 1 cm x 0.1 cm Stage II Sacrum:  Unstageable 12 cm x 10.5 cm 100% devitalized eschar and slough to wound bed.  Purulent drainage noted at 9 o'clock. Drainage (amount, consistency, odor) Minimal purulent drainage to sacral wound.  Foul necrotic odor.  Periwound:Intact Dressing procedure/placement/frequency:Cleanse sacral ulcer with NS and pat gently dry.  Apply Allevyn silicone border foam dressing.  Change every other day and PRN soilage.  Cleanse ulcers to left plantar foot and right heel with soap and water.  Apply Allevyn silicone border foam dressing.  Change every 3-5 days and PRN soilage.  Will not follow at this time.  Please re-consult if needed.  Domenic Moras RN BSN [place] Pager [phone]

## 2014-09-30 NOTE — Progress Notes (Signed)
No family present.  Pt is a Hosp[facility] and Pall[facility] of  pt.  Family called hospice to report changes in pt's condition.  Hospice RN visited pt and offered to address issues at home, however family wanted pt seen at the hospital and called EMS.  Pt is a DNR.  Hospice will continue to follow during hospitalization.  Hope, [place] ext:  [phone].

## 2014-10-01 DIAGNOSIS — F0391 Unspecified dementia with behavioral disturbance: Secondary | ICD-10-CM

## 2014-10-01 DIAGNOSIS — Z515 Encounter for palliative care: Secondary | ICD-10-CM | POA: Insufficient documentation

## 2014-10-01 DIAGNOSIS — E870 Hyperosmolality and hypernatremia: Secondary | ICD-10-CM

## 2014-10-01 DIAGNOSIS — N179 Acute kidney failure, unspecified: Secondary | ICD-10-CM

## 2014-10-01 DIAGNOSIS — J189 Pneumonia, unspecified organism: Principal | ICD-10-CM

## 2014-10-01 LAB — CBC
HCT: 27.4 % — ABNORMAL LOW (ref 36.0–46.0)
Hemoglobin: 8.6 g/dL — ABNORMAL LOW (ref 12.0–15.0)
MCH: 26.4 pg (ref 26.0–34.0)
MCHC: 31.4 g/dL (ref 30.0–36.0)
MCV: 84 fL (ref 78.0–100.0)
PLATELETS: 244 10*3/uL (ref 150–400)
RBC: 3.26 MIL/uL — ABNORMAL LOW (ref 3.87–5.11)
RDW: 15.4 % (ref 11.5–15.5)
WBC: 16.5 10*3/uL — AB (ref 4.0–10.5)

## 2014-10-01 LAB — BASIC METABOLIC PANEL
Anion gap: 8 (ref 5–15)
BUN: 30 mg/dL — ABNORMAL HIGH (ref 6–20)
CO2: 23 mmol/L (ref 22–32)
Calcium: 8.2 mg/dL — ABNORMAL LOW (ref 8.9–10.3)
Chloride: 117 mmol/L — ABNORMAL HIGH (ref 101–111)
Creatinine, Ser: 0.87 mg/dL (ref 0.44–1.00)
GFR calc Af Amer: >60 mL/min (ref >60)
GFR calc non Af Amer: >60 mL/min (ref >60)
Glucose, Bld: 215 mg/dL — ABNORMAL HIGH (ref 65–99)
POTASSIUM: 3 mmol/L — AB (ref 3.5–5.1)
SODIUM: 148 mmol/L — AB (ref 135–145)

## 2014-10-01 LAB — GLUCOSE, CAPILLARY
GLUCOSE-CAPILLARY: 187 mg/dL — AB (ref 65–99)
Glucose-Capillary: 170 mg/dL — ABNORMAL HIGH (ref 65–99)
Glucose-Capillary: 188 mg/dL — ABNORMAL HIGH (ref 65–99)
Glucose-Capillary: 152 mg/dL — ABNORMAL HIGH (ref 65–99)

## 2014-10-01 LAB — URINE CULTURE

## 2014-10-01 MED ORDER — SODIUM CHLORIDE 0.45 % IV SOLN
INTRAVENOUS | Status: DC
  Administered 2014-10-01 – 2014-10-02 (×2): via INTRAVENOUS

## 2014-10-01 MED ORDER — INSULIN ASPART 100 UNIT/ML ~~LOC~~ SOLN
0.0000 [IU] | SUBCUTANEOUS | Status: DC
  Administered 2014-10-01: 1 [IU] via SUBCUTANEOUS
  Administered 2014-10-01 – 2014-10-02 (×3): 2 [IU] via SUBCUTANEOUS
  Administered 2014-10-02 (×2): 1 [IU] via SUBCUTANEOUS
  Administered 2014-10-02 – 2014-10-03 (×2): 2 [IU] via SUBCUTANEOUS
  Administered 2014-10-03 (×2): 3 [IU] via SUBCUTANEOUS
  Administered 2014-10-03: 2 [IU] via SUBCUTANEOUS

## 2014-10-01 MED ORDER — POTASSIUM CHLORIDE 10 MEQ/100ML IV SOLN
10.0000 meq | INTRAVENOUS | Status: AC
  Administered 2014-10-01 – 2014-10-02 (×4): 10 meq via INTRAVENOUS
  Filled 2014-10-01 (×3): qty 100

## 2014-10-01 MED ORDER — VANCOMYCIN HCL IN DEXTROSE 750-5 MG/150ML-% IV SOLN
750.0000 mg | INTRAVENOUS | Status: DC
  Administered 2014-10-02 – 2014-10-03 (×3): 750 mg via INTRAVENOUS
  Filled 2014-10-01 (×4): qty 150

## 2014-10-01 NOTE — Progress Notes (Signed)
CRITICAL VALUE ALERT  Critical value received:  Bld Cx Anaerobic bottle Gram(+) rods  Date of notification:  10/01/14 Time of notification: 0850  Critical value read back:yes  Nurse who received alert: Chalmers Guest, RN  MD notified (1st page): [facility] Time of first page: Face to face  MD notified (2nd page):  Time of second page:  Responding MD: Algis Liming  Time MD responded: 0900

## 2014-10-01 NOTE — Progress Notes (Signed)
Addendum  Discussed at great length with patient's son Mr. Cailie Bosshart. Advised him that patient has very poor prognosis both short and long-term due to her multiple severe acute medical problems complicating underlying advanced dementia, advanced age, frail physical status. Strongly recommended considering full comfort/hospice care. However he at this time wishes to pursue aggressive care including IV antibiotics, blood draws, IV potassium replacement if needed. He states that he wants to give her some time to see if she improves and he will discuss with his family regarding further decisions.  Discussed with palliative care M.D.  Vernell Leep, MD, FACP, FHM. [facility] Pager [phone]  If 7PM-7AM, please contact night-coverage [email or web address] Password [facility] 10/01/2014, 5:31 PM

## 2014-10-01 NOTE — Progress Notes (Signed)
PROGRESS NOTE    [name]:[id number] DOB: 06-29-1940 DOA: 09/29/2014 PCP: Unice Cobble, MD  HPI/Brief narrative 74 year old female patient with history of advanced dementia, chronic sacral decubitus ulcers, PAF on Apixaban, DM, chronic anemia & thrombocytopenia, on home hospice, presented to the [facility] ED on 09/30/14 with altered mental status and not eating well. Patient recently had hematuria which cleared by itself. In the ED, patient clinically dehydrated, worsened renal function, hypernatremia, chest x-ray suggestive of possible pneumonia, UA-possible UTI. She was admitted for further evaluation and management. Admitting M.D. discussed with patient's daughter who advised minimal labs and try to keep patient as comfortable as possible. DO NOT RESUSCITATE was confirmed.   Assessment/Plan:  Acute encephalopathy - Secondary to acute illness (metabolic abnormalities and infection) complicating underlying advanced dementia. - CT head showed no acute findings. - Patient empirically started on IV vancomycin and Fortaz for suspected pneumonia and UTI. - Patient remains nonverbal, not opening eyes or following any instructions  Healthcare associated pneumonia versus aspiration pneumonia - Continue IV vancomycin and Fortaz - Blood culture 1 of 2: Gram-positive rods. Urine culture: Suggestive of contamination. Wound culture pending.  Possible UTI - Continue IV Fortaz  Dehydration with hypernatremia - Secondary to poor oral intake - Hypotonic IV fluids. - At risk for recurrent dehydration from advanced dementia and poor oral intake  Acute kidney injury - Secondary to dehydration. Resolved.  Hypokalemia - Replace and follow  Chronic anemia - Stable  Sacral decubitus ulcer and heel ulcer - Wound care consultation  Chronic anemia - stable  Advanced dementia  Uncontrolled type II DM - SSI  PAF - Rate controlled - Supposed to be on Apixaban but not safe to take  orally.  Goals of care - Patient is a hospice appropriate candidate with advanced dementia and multiple severe chronic and acute medical problems with overall poor prognosis. Palliative care consulted and awaiting goals of care meeting. Discussed with hospice team.   Code Status: DO NOT RESUSCITATE Family Communication: None at bedside Disposition Plan: DC home with home hospice pending palliative care consultation   Consultants:  Palliative care consultation-pending  Procedures:  None  Antibiotics:  IV vancomycin 6/28 >  IV Tressie Ellis 6/28 >   Subjective: Nonverbal. Does not open eyes or follow instructions.  Objective: Filed Vitals:   09/30/14 1400 09/30/14 2122 10/01/14 0612 10/01/14 1324  BP: 112/65 110/54 116/64 115/60  Pulse: 68 81 76 82  Temp: 98.7 F (37.1 C) 98.3 F (36.8 C) 98.2 F (36.8 C) 99.2 F (37.3 C)  TempSrc: Axillary Axillary Oral Axillary  Resp: 18 18 18 19   Height:      Weight:   52.9 kg (116 lb 10 oz)   SpO2: 100% 99% 96% 100%    Intake/Output Summary (Last 24 hours) at 10/01/14 1600 Last data filed at 10/01/14 1327  Gross per 24 hour  Intake    750 ml  Output    200 ml  Net    550 ml   Filed Weights   09/29/14 2312 10/01/14 0612  Weight: 49.896 kg (110 lb) 52.9 kg (116 lb 10 oz)     Exam:  General exam: Elderly frail female lying comfortably supine in bed Respiratory system: Poor inspiratory effort but seems clear to auscultation except in the bases with reduced breath sounds. No increased work of breathing. Cardiovascular system: S1 & S2 heard, irregularly irregular. No JVD, murmurs, gallops, clicks or pedal edema. Gastrointestinal system: Abdomen is nondistended, soft and nontender. Normal bowel  sounds heard. Central nervous system: Not responding. No eye opening, verbal response or limb movements. No focal neurological deficits. Extremities: No limb movements. Possible contractures or increased tone of all limbs.   Data  Reviewed: Basic Metabolic Panel:  Recent Labs Lab 09/30/14 0012 09/30/14 1045  NA 147* 148*  K 3.2* 2.9*  CL 113* 117*  CO2 23 23  GLUCOSE 292* 280*  BUN 45* 39*  CREATININE 1.26* 0.97  CALCIUM 7.7* 7.7*   Liver Function Tests:  Recent Labs Lab 09/30/14 0012 09/30/14 1045  AST 26 21  ALT 19 19  ALKPHOS 103 91  BILITOT 1.2 0.9  PROT 5.6* 5.8*  ALBUMIN 1.8* 1.8*   No results for input(s): LIPASE, AMYLASE in the last 168 hours. No results for input(s): AMMONIA in the last 168 hours. CBC:  Recent Labs Lab 09/30/14 0012 09/30/14 1045  WBC 11.2* 10.5  NEUTROABS 8.4* 8.3*  HGB 7.9* 8.7*  HCT 25.3* 28.7*  MCV 84.1 86.2  PLT 199 203   Cardiac Enzymes: No results for input(s): CKTOTAL, CKMB, CKMBINDEX, TROPONINI in the last 168 hours. BNP (last 3 results) No results for input(s): PROBNP in the last 8760 hours. CBG:  Recent Labs Lab 09/30/14 1718 09/30/14 2212 09/30/14 2352 10/01/14 0746 10/01/14 1220  GLUCAP 220* 224* 195* 170* 188*    Recent Results (from the past 240 hour(s))  Wound culture     Status: None (Preliminary result)   Collection Time: 09/29/14 11:53 PM  Result Value Ref Range Status   Specimen Description WOUND  Final   Special Requests COCCYX  Final   Gram Stain   Final    RARE WBC PRESENT,BOTH PMN AND MONONUCLEAR NO SQUAMOUS EPITHELIAL CELLS SEEN ABUNDANT GRAM NEGATIVE RODS ABUNDANT GRAM POSITIVE COCCI IN PAIRS IN CLUSTERS MODERATE GRAM POSITIVE RODS    Culture   Final    Culture reincubated for better growth Performed at [facility]    Report Status PENDING  Incomplete  Urine culture     Status: None   Collection Time: 09/30/14 12:05 AM  Result Value Ref Range Status   Specimen Description URINE, CATHETERIZED  Final   Special Requests NONE  Final   Culture   Final    MULTIPLE SPECIES PRESENT, SUGGEST RECOLLECTION IF CLINICALLY INDICATED   Report Status 10/01/2014 FINAL  Final  Culture, blood (routine x 2)      Status: None (Preliminary result)   Collection Time: 09/30/14 12:15 AM  Result Value Ref Range Status   Specimen Description BLOOD RIGHT ANTECUBITAL  Final   Special Requests   Final    BOTTLES DRAWN AEROBIC AND ANAEROBIC 10CC BLUE 5CC RED   Culture  Setup Time   Final    GRAM POSITIVE RODS ANAEROBIC BOTTLE ONLY CRITICAL RESULT CALLED TO, READ BACK BY AND VERIFIED WITH: JBOZEMAN,RN AT 5397 10/01/14 BY LBENFIELD    Culture NO GROWTH 1 DAY  Final   Report Status PENDING  Incomplete  Blood culture (routine x 2)     Status: None (Preliminary result)   Collection Time: 09/30/14 12:27 AM  Result Value Ref Range Status   Specimen Description BLOOD RIGHT HAND  Final   Special Requests BOTTLES DRAWN AEROBIC ONLY 10CC  Final   Culture NO GROWTH 1 DAY  Final   Report Status PENDING  Incomplete         Studies: Ct Head Wo Contrast  09/30/2014   CLINICAL DATA:  Initial evaluation for acute encephalopathy  EXAM: CT HEAD WITHOUT  CONTRAST  TECHNIQUE: Contiguous axial images were obtained from the base of the skull through the vertex without intravenous contrast.  COMPARISON:  Prior CT from 08/29/2013  FINDINGS: Atrophy with chronic microvascular ischemic disease present, similar to prior. Remote lacunar infarct present within the bilateral basal ganglia. Remote infarcts also present within the right cerebellar hemisphere.  No acute large vessel territory infarct. No intracranial hemorrhage. No mass lesion, midline shift, or mass effect. No hydrocephalus. No extra-axial fluid collection.  Scalp soft tissues within normal limits. No acute abnormality about the orbits.  Minimal opacity present within the right ethmoidal air cells. Paranasal sinuses are otherwise clear. No mastoid effusion.  Calvarium intact.  IMPRESSION: 1. No acute intracranial process. 2. Generalized cerebral atrophy with chronic microvascular ischemic disease. 3. Remote lacunar infarcts involving the bilateral basal ganglia, with  additional remote right cerebellar infarcts.   Electronically Signed   By: Jeannine Boga M.D.   On: 09/30/2014 05:39   Dg Chest Port 1 View  09/30/2014   CLINICAL DATA:  Fever and dyspnea  EXAM: PORTABLE CHEST - 1 VIEW  COMPARISON:  07/18/2014  FINDINGS: There is left base consolidation consistent with pneumonia. The right lung is clear. There may be a small left effusion.  Heart size is normal and unchanged. There are intact appearances of the transvenous leads.  IMPRESSION: Left base consolidation consistent with pneumonia.   Electronically Signed   By: Andreas Newport M.D.   On: 09/30/2014 00:25        Scheduled Meds: . apixaban  5 mg Oral BID  . cefTAZidime (FORTAZ)  IV  1 g Intravenous Q12H  . vancomycin  750 mg Intravenous Q24H   Continuous Infusions:   Principal Problem:   Acute encephalopathy Active Problems:   Essential hypertension, benign   Hypernatremia   AKI (acute kidney injury)   HCAP (healthcare-associated pneumonia)   Decubitus ulcer of sacral region, stage 4   Chronic anemia    Time spent: 40 minutes.    Vernell Leep, MD, FACP, FHM. [facility] Pager [phone]  If 7PM-7AM, please contact night-coverage [email or web address] Password [email or web address] 10/01/2014, 4:00 PM    LOS: 1 day

## 2014-10-01 NOTE — Clinical Documentation Improvement (Signed)
Possible Clinical Conditions?  Severe Malnutrition   Protein Calorie Malnutrition Severe Protein Calorie Malnutrition Other Condition Cannot clinically determine  Supporting Information:  Risk Factors:(As per notes) "Protein-calorie malnutrition, severe 08/08/2013"  Signs & Symptoms:BMI 18.31 kg/m2     Thank You, Alessandra Grout, RN, BSN, CCDS,Clinical Documentation Specialist:  [phone]  [phone]=[facility] [place]- Health Information Management

## 2014-10-01 NOTE — Progress Notes (Signed)
Palliative consult request received Discussed with [facility] liaison [name] Call placed and case discussed with [name] who is the primary caregiver:  He is aware that the patient is admitted with UTI, possible PNA, she has dementia, at baseline she is non verbal, she has been enrolled with [facility] for the past 2-3 months.   The patient's [name] states that the patient becomes encephalopathic with acute on chronic worsening of her mental status with any infection.   DNR DNI, son agreeable for continuation of hospice services but goals are not comfort only at this point.   Goals are for time trial of current therapies for 24-48 more hours: IVF, antibiotics, electrolyte replacement etc. Any measures to get the patient back to her baseline before this acute infection are to be attempted.   If the patient continues to be unresponsive or is not responding to antibiotics and other measures in the next 24-48 hours, son is agreeable to have further discussions about comfort care.   Full consult note will follow Thank you for the consultation  Loistine Chance MD [facility] health palliative medicine [phone]

## 2014-10-01 NOTE — Progress Notes (Addendum)
[facility] 6N Rm 27-[facility] [place] RN Visit- Barbra Sarks RN This is a related and covered admission to pr's hospice dx of Alzheimer's Dementia. Code Status is DNR. Patient lying in bed with her eyes closed. No family in the room. Patient does not respond to her name or to touch. Pt. continues to receive IV abx.  [facility] will continue to follow daily. Please call for questions.   [facility] Liaison [phone]    . Addendum 340pm   Received telephone call back  from son Legrand Como who reports he will be in today at 30 or 6 pm to visit his mother. He was advised that a plan was being formed  to anticipate any discharge needs when she goes home.  Barbra Sarks RN

## 2014-10-01 NOTE — Consult Note (Signed)
Consultation Note Date: 10/01/2014   Patient Name: [name]  DOB: October 06, 1940  MRN: [id number]  Age / Sex: 74 y.o., female   PCP: Hendricks Limes, MD Referring Physician: Modena Jansky, MD  Reason for Consultation: Establishing goals of care  Palliative Care Assessment and Plan Summary of Established Goals of Care and Medical Treatment Preferences   74 year old female patient with history of advanced dementia, chronic sacral decubitus ulcers, PAF on Apixaban, DM, chronic anemia & thrombocytopenia, on home hospice, presented to the [facility] ED on 09/30/14 with altered mental status and not eating well. Patient recently had hematuria which cleared by itself. In the ED, patient clinically dehydrated, worsened renal function, hypernatremia, chest x-ray suggestive of possible pneumonia, UA-possible UTI. She was admitted for further evaluation and management. Admitting M.D. discussed with patient's daughter who advised minimal labs and try to keep patient as comfortable as possible. DO NOT RESUSCITATE was confirmed.  Palliative care consultation for goals of care discussions. Patient is resting in bed with her eyes closed. She opens eyes to loud verbal stimulus otherwise does not respond much. Call placed to son Legrand Como at [phone]. His past patient's current condition. Discussed patient's underlying conditions. Discussed scope of hospice services. All questions answered.  At this time, the patient's and wishes to continue with current measures as the time trial. Discussed with primary hospitalist. Palliative will continue to follow along and help guide decision making.  Contacts/Participants in Discussion: Primary Decision Maker: Son Legrand Como lives with the patient at [phone] HCPOA: yes  Legrand Como  Code Status/Advance Care Planning:  CODE STATUS is DO NOT RESUSCITATE  Symptom Management:   No acute symptoms. Patient is essentially unresponsive. Does not appear to be in  distress.  Palliative Prophylaxis: Yes  Additional Recommendations (Limitations, Scope, Preferences):  Continue time-limited trial for another 24-48 hours. Strongly consider comfort measures only. Psycho-social/Spiritual:   Support System: Son Legrand Como   Desire for further Chaplaincy support:no  Prognosis: < 2 weeks  Discharge Planning:  Home with Hospice  Values: Patient's son elects continuation of current treatment measures, at least for now. Life limiting illness: Dementia      Chief Complaint/History of Present Illness: Admitted for UTI  Primary Diagnoses  Present on Admission:  . HCAP (healthcare-associated pneumonia) . Acute encephalopathy . AKI (acute kidney injury) . Essential hypertension, benign . Hypernatremia . Decubitus ulcer of sacral region, stage 4 . Chronic anemia  Palliative Review of Systems: Noted I have reviewed the medical record, interviewed the patient and family, and examined the patient. The following aspects are pertinent.  Past Medical History  Diagnosis Date  . Dysphagia   . Diabetes mellitus   . Alzheimer disease   . Hypokalemia   . Anemia   . 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 DVT (deep venous thrombosis)     [facility]  . Hypertension   . Abnormality of gait 06/13/2014  . Coronary artery disease   . Presence of permanent cardiac pacemaker   . Peripheral vascular disease   . Chronic kidney disease   . Seizures   . Medical history non-contributory   . HCAP (healthcare-associated pneumonia) 09/30/2014   History   Social History  . Marital Status: Married    Spouse Name: N/A  . Number of Children: 8  . Years of Education: N/A   Social History Main Topics  . Smoking status: Never Smoker   . Smokeless tobacco: Not on file     Comment: Patient exposed to second hand smoke  daily   . Alcohol Use: No  . Drug Use: No  . Sexual Activity: No   Other Topics Concern  . None   Social History Narrative   Household  includes her husband, one daughter, one granddaughter, one Product manager and a son who is a Dietitian and spends most of the time outside the house.      Patient is right handed.   Patient does not drink caffeine.   Family History  Problem Relation Age of Onset  . Alzheimer's disease    . Diabetes    . Stroke Father   . Diabetes Brother   . Seizures Brother   . Diabetes Brother    Scheduled Meds: . apixaban  5 mg Oral BID  . cefTAZidime (FORTAZ)  IV  1 g Intravenous Q12H  . vancomycin  750 mg Intravenous Q24H   Continuous Infusions: . sodium chloride     PRN Meds:.[DISCONTINUED] acetaminophen **OR** acetaminophen, morphine CONCENTRATE Medications Prior to Admission:  Prior to Admission medications   Medication Sig Start Date End Date Taking? Authorizing Provider  acetaminophen (TYLENOL) 500 MG tablet Take 1,000 mg by mouth every 6 (six) hours as needed.   Yes Historical Provider, MD  collagenase (SANTYL) ointment Apply 1 application topically daily.   Yes Historical Provider, MD  feeding supplement, ENSURE ENLIVE, (ENSURE ENLIVE) LIQD Take 237 mLs by mouth 2 (two) times daily between meals. 07/25/14  Yes Thurnell Lose, MD  apixaban (ELIQUIS) 5 MG TABS tablet Take 1 tablet (5 mg total) by mouth 2 (two) times daily. Call your PCP and get future refills from PCP 10 days before this medication supply ends 07/25/14   Thurnell Lose, MD  Blood Glucose Monitoring Suppl (ONE TOUCH ULTRA 2) W/DEVICE KIT Use to test blood sugars before each meal and at bedtime so four times daily ICD 10 E11 59 06/09/14   Hendricks Limes, MD  glucose blood test strip ONETOUCH ULTRA ULTRA TEST STRIPS Use to test blood sugar before each meal and at bedtime so 4 times daily ICD 10 E11 59 06/09/14   Hendricks Limes, MD  Insulin Detemir (LEVEMIR FLEXPEN) 100 UNIT/ML Pen Inject 8 Units into the skin every evening. 15 units each evening Patient taking differently: Inject 5-10 Units into the skin every  evening.  07/25/14   Thurnell Lose, MD  LORazepam (ATIVAN) 2 MG/ML concentrated solution Take 0.5 mLs (1 mg total) by mouth every 6 (six) hours as needed for anxiety. 07/25/14   Thurnell Lose, MD  memantine (NAMENDA) 10 MG tablet Take 1 tablet (10 mg total) by mouth 2 (two) times daily. 05/12/14   Hendricks Limes, MD  Morphine Sulfate (MORPHINE CONCENTRATE) 10 MG/0.5ML SOLN concentrated solution Take 0.5 mLs (10 mg total) by mouth every 3 (three) hours as needed for moderate pain or severe pain. 07/25/14   Thurnell Lose, MD  [facility] DELICA LANCETS 96E MISC Use to test blood sugar before each meal and at bedtime so four times dialy ICD 10 E11 59 06/09/14   Hendricks Limes, MD   Allergies  Allergen Reactions  . Nsaids Other (See Comments)    GI Bleeding  . Ace Inhibitors Cough   CBC:    Component Value Date/Time   WBC 10.5 09/30/2014 1045   HGB 8.7* 09/30/2014 1045   HCT 28.7* 09/30/2014 1045   PLT 203 09/30/2014 1045   MCV 86.2 09/30/2014 1045   NEUTROABS 8.3* 09/30/2014 1045   LYMPHSABS 1.4  09/30/2014 1045   MONOABS 0.8 09/30/2014 1045   EOSABS 0.0 09/30/2014 1045   BASOSABS 0.0 09/30/2014 1045   Comprehensive Metabolic Panel:    Component Value Date/Time   NA 148* 09/30/2014 1045   K 2.9* 09/30/2014 1045   CL 117* 09/30/2014 1045   CO2 23 09/30/2014 1045   BUN 39* 09/30/2014 1045   CREATININE 0.97 09/30/2014 1045   CREATININE 1.06 12/16/2011 1826   GLUCOSE 280* 09/30/2014 1045   CALCIUM 7.7* 09/30/2014 1045   AST 21 09/30/2014 1045   ALT 19 09/30/2014 1045   ALKPHOS 91 09/30/2014 1045   BILITOT 0.9 09/30/2014 1045   PROT 5.8* 09/30/2014 1045   ALBUMIN 1.8* 09/30/2014 1045    Physical Exam: Vital Signs: BP 115/60 mmHg  Pulse 82  Temp(Src) 99.2 F (37.3 C) (Axillary)  Resp 19  Ht $R'5\' 5"'xp$  (1.651 m)  Wt 52.9 kg (116 lb 10 oz)  BMI 19.41 kg/m2  SpO2 100% SpO2: SpO2: 100 % O2 Device: O2 Device: Nasal Cannula O2 Flow Rate: O2 Flow Rate (L/min): 2  L/min Intake/output summary:  Intake/Output Summary (Last 24 hours) at 10/01/14 1703 Last data filed at 10/01/14 1327  Gross per 24 hour  Intake    800 ml  Output    200 ml  Net    600 ml   LBM: Last BM Date: 10/01/14 Baseline Weight: Weight: 49.896 kg (110 lb) Most recent weight: Weight: 52.9 kg (116 lb 10 oz)  Exam Findings:  Elderly lady resting in bed opens eyes to voice command does not respond otherwise Shallow anterior breathing S1-S2 Abdomen soft Withdraws to pain                        Palliative Performance Scale: 10% Additional Data Reviewed: Recent Labs     09/30/14  0012  09/30/14  1045  WBC  11.2*  10.5  HGB  7.9*  8.7*  PLT  199  203  NA  147*  148*  BUN  45*  39*  CREATININE  1.26*  0.97     Time In: 1530  Time Out: 1625 Time Total: 55 min Greater than 50%  of this time was spent counseling and coordinating care related to the above assessment and plan.  Signed by: Loistine Chance, MD [place], MD  10/01/2014, 5:03 PM  Please contact Palliative Medicine Team phone at [phone] for questions and concerns.

## 2014-10-01 NOTE — Progress Notes (Signed)
ANTIBIOTIC CONSULT NOTE - FOLLOW UP  Pharmacy Consult for Vancomycin and Fortaz Indication: rule out pneumonia  Allergies  Allergen Reactions  . Nsaids Other (See Comments)    GI Bleeding  . Ace Inhibitors Cough    Patient Measurements: Height: 5\' 5"  (165.1 cm) Weight: 116 lb 10 oz (52.9 kg) IBW/kg (Calculated) : 57  Vital Signs: Temp: 98.2 F (36.8 C) (06/29 0612) Temp Source: Oral (06/29 0612) BP: 116/64 mmHg (06/29 0612) Pulse Rate: 76 (06/29 0612) Intake/Output from previous day: 06/28 0701 - 06/29 0700 In: 750 [I.V.:600; IV Piggyback:150] Out: 700 [Urine:700] Intake/Output from this shift:    Labs:  Recent Labs  09/30/14 0012 09/30/14 1045  WBC 11.2* 10.5  HGB 7.9* 8.7*  PLT 199 203  CREATININE 1.26* 0.97   Estimated Creatinine Clearance: 43.1 mL/min (by C-G formula based on Cr of 0.97). No results for input(s): VANCOTROUGH, VANCOPEAK, VANCORANDOM, GENTTROUGH, GENTPEAK, GENTRANDOM, TOBRATROUGH, TOBRAPEAK, TOBRARND, AMIKACINPEAK, AMIKACINTROU, AMIKACIN in the last 72 hours.   Microbiology: Recent Results (from the past 720 hour(s))  Wound culture     Status: None (Preliminary result)   Collection Time: 09/29/14 11:53 PM  Result Value Ref Range Status   Specimen Description WOUND  Final   Special Requests COCCYX  Final   Gram Stain   Final    RARE WBC PRESENT,BOTH PMN AND MONONUCLEAR NO SQUAMOUS EPITHELIAL CELLS SEEN ABUNDANT GRAM NEGATIVE RODS ABUNDANT GRAM POSITIVE COCCI IN PAIRS IN CLUSTERS MODERATE GRAM POSITIVE RODS    Culture PENDING  Incomplete   Report Status PENDING  Incomplete    Anti-infectives    Start     Dose/Rate Route Frequency Ordered Stop   10/02/14 0600  vancomycin (VANCOCIN) IVPB 1000 mg/200 mL premix  Status:  Discontinued     1,000 mg 200 mL/hr over 60 Minutes Intravenous Every 48 hours 09/30/14 0659 09/30/14 0746   10/01/14 2200  vancomycin (VANCOCIN) IVPB 750 mg/150 ml premix     750 mg 150 mL/hr over 60 Minutes  Intravenous Every 24 hours 10/01/14 0743     10/01/14 0100  vancomycin (VANCOCIN) 500 mg in sodium chloride 0.9 % 100 mL IVPB  Status:  Discontinued     500 mg 100 mL/hr over 60 Minutes Intravenous Every 48 hours 09/30/14 0746 10/01/14 0743   09/30/14 1000  cefTAZidime (FORTAZ) 1 g in dextrose 5 % 50 mL IVPB     1 g 100 mL/hr over 30 Minutes Intravenous Every 12 hours 09/30/14 0659     09/30/14 0015  vancomycin (VANCOCIN) IVPB 1000 mg/200 mL premix     1,000 mg 200 mL/hr over 60 Minutes Intravenous  Once 09/30/14 0004 09/30/14 0136   09/30/14 0015  piperacillin-tazobactam (ZOSYN) IVPB 3.375 g     3.375 g 100 mL/hr over 30 Minutes Intravenous  Once 09/30/14 0004 09/30/14 0107      [name], possible PNA, for empiric antibiotics. Now day #2 of abx. SCr improved to 0.97, CrCl ~84ml/min. Afebrile, WBC down to wnl.  Goal of Therapy:  Vancomycin trough level 15-20 mcg/ml  Resolution of infection  Plan:  Continue ceftazidime 1g IV Q12 Change vancomycin to 750mg  IV Q24 tonight Monitor clinical picture, renal function, C&S F/U abx deescalation  [name] J 10/01/2014,7:44 AM

## 2014-10-02 LAB — GLUCOSE, CAPILLARY
GLUCOSE-CAPILLARY: 125 mg/dL — AB (ref 65–99)
GLUCOSE-CAPILLARY: 131 mg/dL — AB (ref 65–99)
Glucose-Capillary: 114 mg/dL — ABNORMAL HIGH (ref 65–99)
Glucose-Capillary: 117 mg/dL — ABNORMAL HIGH (ref 65–99)
Glucose-Capillary: 143 mg/dL — ABNORMAL HIGH (ref 65–99)
Glucose-Capillary: 156 mg/dL — ABNORMAL HIGH (ref 65–99)
Glucose-Capillary: 158 mg/dL — ABNORMAL HIGH (ref 65–99)

## 2014-10-02 LAB — BASIC METABOLIC PANEL
ANION GAP: 7 (ref 5–15)
BUN: 22 mg/dL — ABNORMAL HIGH (ref 6–20)
CHLORIDE: 115 mmol/L — AB (ref 101–111)
CO2: 25 mmol/L (ref 22–32)
Calcium: 8.1 mg/dL — ABNORMAL LOW (ref 8.9–10.3)
Creatinine, Ser: 0.87 mg/dL (ref 0.44–1.00)
GFR calc non Af Amer: >60 mL/min (ref >60)
GLUCOSE: 160 mg/dL — AB (ref 65–99)
Potassium: 3.6 mmol/L (ref 3.5–5.1)
SODIUM: 147 mmol/L — AB (ref 135–145)

## 2014-10-02 LAB — CBC
HCT: 25.5 % — ABNORMAL LOW (ref 36.0–46.0)
Hemoglobin: 7.8 g/dL — ABNORMAL LOW (ref 12.0–15.0)
MCH: 25.7 pg — AB (ref 26.0–34.0)
MCHC: 30.6 g/dL (ref 30.0–36.0)
MCV: 83.9 fL (ref 78.0–100.0)
Platelets: 243 10*3/uL (ref 150–400)
RBC: 3.04 MIL/uL — AB (ref 3.87–5.11)
RDW: 15.4 % (ref 11.5–15.5)
WBC: 19.3 10*3/uL — ABNORMAL HIGH (ref 4.0–10.5)

## 2014-10-02 LAB — WOUND CULTURE

## 2014-10-02 MED ORDER — HEPARIN SODIUM (PORCINE) 5000 UNIT/ML IJ SOLN
5000.0000 [IU] | Freq: Three times a day (TID) | INTRAMUSCULAR | Status: DC
  Administered 2014-10-02 – 2014-10-03 (×3): 5000 [IU] via SUBCUTANEOUS
  Filled 2014-10-02 (×4): qty 1

## 2014-10-02 MED ORDER — POTASSIUM CL IN DEXTROSE 5% 20 MEQ/L IV SOLN
20.0000 meq | INTRAVENOUS | Status: DC
  Administered 2014-10-02 – 2014-10-03 (×2): 20 meq via INTRAVENOUS
  Filled 2014-10-02 (×4): qty 1000

## 2014-10-02 NOTE — Progress Notes (Signed)
[facility] [address]-[facility] RN Visit- Annia Belt RN, BSN  This is a related, covered admission to patient's [facility] diagnosis of Alzheimer's Dementia. Code Status is DNR. Patient seen in room laying in bed.  Patient does not respond to verbal stimuli or touch.  Patient appears comfortable.  IVF noted to be infusing through a PIV.  No family present at time of assessment. Breakfast tray at bedside is untouched.  Per chart review, patient receiving Fortaz Q 12 hours IV and Vancomycin Q 12.  Patient has not received any pain medication.  [facility] will continue to follow and anticipate any discharge needs.  Please call with any questions or concerns.   Annia Belt RN, [facility] Liaison [phone]

## 2014-10-02 NOTE — Progress Notes (Signed)
Daily Progress Note   Patient Name: [name]       Date: 10/02/2014 DOB: 01/01/1941  Age: 74 y.o. MRN#: [id number] Attending Physician: Modena Jansky, MD Primary Care Physician: Unice Cobble, MD Admit Date: 09/29/2014  Reason for Consultation/Follow-up: Establishing goals of care  Subjective:  remains unresponsive. Discussed with bedside RN, call placed and discussed with [name] over the phone as well.   Interval Events:  Leukocytosis worse, Na levels essentially unchanged.   Length of Stay: 2 days  Current Medications: Scheduled Meds:  . cefTAZidime (FORTAZ)  IV  1 g Intravenous Q12H  . heparin subcutaneous  5,000 Units Subcutaneous 3 times per day  . insulin aspart  0-9 Units Subcutaneous 6 times per day  . vancomycin  750 mg Intravenous Q24H    Continuous Infusions: . dextrose 5 % with KCl 20 mEq / L      PRN Meds: [DISCONTINUED] acetaminophen **OR** acetaminophen, morphine CONCENTRATE  Palliative Performance Scale: 10%     Vital Signs: BP 104/64 mmHg  Pulse 84  Temp(Src) 98.9 F (37.2 C) (Axillary)  Resp 18  Ht 5\' 5"  (1.651 m)  Wt 54.432 kg (120 lb)  BMI 19.97 kg/m2  SpO2 100% SpO2: SpO2: 100 % O2 Device: O2 Device: Nasal Cannula O2 Flow Rate: O2 Flow Rate (L/min): 2 L/min  Intake/output summary:  Intake/Output Summary (Last 24 hours) at 10/02/14 1621 Last data filed at 10/02/14 1300  Gross per 24 hour  Intake 1754.58 ml  Output    375 ml  Net 1379.58 ml   LBM:   Baseline Weight: Weight: 49.896 kg (110 lb) Most recent weight: Weight: 54.432 kg (120 lb) (bed changed to overlay mattress)  Physical Exam:      Frail unresponsive lady Shallow resp anteriorly S 1 S2 Abdomen soft Does not respond, opens eyes when turned according to nursing.      Additional Data Reviewed: Recent Labs     10/01/14  1818  10/02/14  0830  WBC  16.5*  19.3*  HGB  8.6*  7.8*  PLT  244  243  NA  148*  147*  BUN  30*  22*  CREATININE  0.87  0.87      Problem List:  Patient Active Problem List   Diagnosis Date Noted  . Encounter for palliative care   . HCAP (healthcare-associated pneumonia) 09/30/2014  . Decubitus ulcer of sacral region, stage 4 09/30/2014  . Chronic anemia 09/30/2014  . Acute kidney injury (nontraumatic)   . Weakness generalized 07/25/2014  . Dementia with behavioral disturbance 07/25/2014  . DNR (do not resuscitate)   . Hypoglycemia   . Acute renal failure syndrome   . Urinary tract infectious disease   . Metabolic encephalopathy   . Thrombocythemia   . DVT (deep venous thrombosis)   . Altered mental state 07/18/2014  . Abnormality of gait 06/13/2014  . Type II or unspecified type diabetes mellitus with peripheral circulatory disorders, uncontrolled(250.72) 12/04/2013  . Heel ulcer 11/24/2013  . AKI (acute kidney injury) 11/24/2013  . Protein-calorie malnutrition, severe 08/08/2013  . Acute encephalopathy 08/07/2013  . Hypernatremia 08/06/2013  . ARF (acute renal failure) 08/06/2013  . Sepsis 08/06/2013  . UTI (lower urinary tract infection) 08/06/2013  . Palliative care encounter 08/06/2013  . Hyperosmolar (nonketotic) coma 08/06/2013  . Non-compliance 07/24/2013  . Encounter for therapeutic drug monitoring 05/07/2013  . Paroxysmal atrial fibrillation 04/22/2013  . Syncope 04/20/2013  . 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 Long term  current use of anticoagulant 06/02/2010  . DYSLIPIDEMIA 10/06/2009  . Essential hypertension, benign 10/06/2009  . CARDIAC PACEMAKER IN SITU 10/06/2009  . Type II or unspecified type diabetes mellitus with neurological manifestations, uncontrolled 07/21/2008  . DYSPHAGIA, UNSPECIFIED 12/15/2006  . ANEMIA-NOS 08/29/2006  . Alzheimer's disease 08/29/2006     Palliative Care Assessment & Plan    Code Status:  DNR  Goals of Care:   Discussed again with [name]: he maintains that the patient's unresponsiveness is due to acute infection. He wishes for the  patient to continue to receive antibiotics IV. He states that the patient should not be made comfort care. "that's my mother and I've been taking care of her for years."   Continue current care, patient's son's goals are not concordant with hospice at this time. Continue gentle conversations.    Desire for further Chaplaincy support:no  3. Symptom Management:   none needed  4. Palliative Prophylaxis:  Stool Softner: yes  5. Prognosis: < 2 weeks  5. Discharge Planning: pending current hospitalization course.    Care plan was discussed with [name] over the phone. He does not want to change the patient's plan of care to comfort measures. He wishes for current measures to continue. Discussed openly that current treatment measures have not been working. The patient remains encephalopathic, with worsening labs etc. All questions answered. Palliative will follow PRN.   Thank you for allowing the Palliative Medicine Team to assist in the care of this patient.   Time In: 1600 Time Out: 1625 Total Time 25 Prolonged Time Billed  no     Greater than 50%  of this time was spent counseling and coordinating care related to the above assessment and plan.   Loistine Chance, MD  10/02/2014, 4:21 PM  [phone] Please contact Palliative Medicine Team phone at [phone] for questions and concerns.

## 2014-10-02 NOTE — Care Management (Signed)
Important Message  Patient Details  Name: [name] MRN: [id number] Date of Birth: 12/06/1940   Medicare Important Message Given:  Yes-second notification given    Delorse Lek 10/02/2014, 10:44 AM

## 2014-10-02 NOTE — Progress Notes (Addendum)
PROGRESS NOTE    [name] GXQ:[id number] DOB: [birth year] DOA: 09/29/2014 PCP: Unice Cobble, MD  HPI/Brief narrative 74 year old female patient with history of advanced dementia, chronic sacral decubitus ulcers, PAF on Apixaban, DM, chronic anemia & thrombocytopenia, on home hospice, presented to the [facility] ED on 09/30/14 with altered mental status and not eating well. Patient recently had hematuria which cleared by itself. In the ED, patient clinically dehydrated, worsened renal function, hypernatremia, chest x-ray suggestive of possible pneumonia, UA-possible UTI. She was admitted for further evaluation and management. Admitting M.D. discussed with patient's daughter who advised minimal labs and try to keep patient as comfortable as possible. DO NOT RESUSCITATE was confirmed.   Assessment/Plan:  Acute encephalopathy - Secondary to acute illness (metabolic abnormalities and infection) complicating underlying advanced dementia. - CT head showed no acute findings. - Patient empirically started on IV vancomycin and Fortaz for suspected pneumonia and UTI. - Patient remains nonverbal, not opening eyes or following any instructions - As per nursing, patient did open her eyes briefly and tolerated some diet by her caregiver on 6/29.  Healthcare associated pneumonia versus aspiration pneumonia - Continue IV vancomycin and Fortaz - Blood culture 1 of 2: Gram-positive rods. Urine culture: Suggestive of contamination. Wound culture pending.  Possible UTI - Continue IV Fortaz - Urine culture: Suggestive of contamination.  Dehydration with hypernatremia - Secondary to poor oral intake - Hypotonic IV fluids. - At risk for recurrent dehydration from advanced dementia and poor oral intake - Sodium without significant improvement. Will change IV fluids to D5W.  Acute kidney injury - Secondary to dehydration. Resolved.  Hypokalemia - Replaced  Chronic anemia - hemoglobin has dropped from  8.6 > 7.8. Likely dilutional. Follow CBC in a.m.   Sacral decubitus ulcer and heel ulcer - Wound care consultation 6/30 appreciated.   Advanced dementia  Uncontrolled type II DM - SSI. Controlled   PAF - Rate controlled - Supposed to be on Apixaban but not safe to take orally. - Will add subcutaneous heparin for DVT prophylaxis.   Goals of care - Patient is a hospice appropriate candidate with advanced dementia and multiple severe chronic and acute medical problems with overall poor prognosis. Palliative care consulted and awaiting goals of care meeting. Discussed with hospice team. - Await palliative care team follow-up with family.  - As per nursing, some complicated family dynamics where son, the healthcare power of attorney apparently wishes to continue aggressive care whereas daughters want comfort/hospice care.  ? Colovaginal/rectovaginal fistula - Not a candidate for any aggressive intervention.  Severe protein calorie malnutrition - Will be an ongoing issue secondary to advanced dementia and poor oral intake. Not candidate for and would not recommend artificial feeding.   DVT prophylaxis: Subcutaneous heparin Code Status: DO NOT RESUSCITATE Family Communication: discussed extensively with patient's son Mr. Hattabaugh on 6/29  Disposition Plan: DC home with home hospice pending palliative care consultation   Consultants:  Palliative care consultation  Procedures:  Foley catheter.   Antibiotics:  IV vancomycin 6/28 >  IV Tressie Ellis 6/28 >   Subjective: Nonverbal. Does not open eyes or follow instructions. As per nursing, seems to be having stools coming out of her vagina/urine. He was febrile 101.65F overnight.  Objective: Filed Vitals:   10/01/14 2313 10/02/14 0030 10/02/14 0520 10/02/14 1412  BP: 110/62  104/54 104/64  Pulse:   86 84  Temp: 101.2 F (38.4 C) 100.2 F (37.9 C) 100.2 F (37.9 C) 98.9 F (37.2 C)  TempSrc: Axillary  Axillary Axillary Axillary    Resp: 18  18 18   Height:      Weight:   54.432 kg (120 lb)   SpO2: 100%  100% 100%    Intake/Output Summary (Last 24 hours) at 10/02/14 1555 Last data filed at 10/02/14 1300  Gross per 24 hour  Intake 1754.58 ml  Output    375 ml  Net 1379.58 ml   Filed Weights   09/29/14 2312 10/01/14 0612 10/02/14 0520  Weight: 49.896 kg (110 lb) 52.9 kg (116 lb 10 oz) 54.432 kg (120 lb)     Exam:  General exam: Elderly frail female lying comfortably supine in bed Respiratory system: Poor inspiratory effort but seems clear to auscultation except in the bases with reduced breath sounds. No increased work of breathing. Cardiovascular system: S1 & S2 heard, irregularly irregular. No JVD, murmurs, gallops, clicks or pedal edema. Gastrointestinal system: Abdomen is nondistended, soft and nontender. Normal bowel sounds heard. Foley catheter with cloudy urine.  Central nervous system: Not responding. No eye opening, verbal response or limb movements. No focal neurological deficits. No change compared to 6/29. Extremities: No limb movements. Possible contractures or increased tone of all limbs.   Data Reviewed: Basic Metabolic Panel:  Recent Labs Lab 09/30/14 0012 09/30/14 1045 10/01/14 1818 10/02/14 0830  NA 147* 148* 148* 147*  K 3.2* 2.9* 3.0* 3.6  CL 113* 117* 117* 115*  CO2 23 23 23 25   GLUCOSE 292* 280* 215* 160*  BUN 45* 39* 30* 22*  CREATININE 1.26* 0.97 0.87 0.87  CALCIUM 7.7* 7.7* 8.2* 8.1*   Liver Function Tests:  Recent Labs Lab 09/30/14 0012 09/30/14 1045  AST 26 21  ALT 19 19  ALKPHOS 103 91  BILITOT 1.2 0.9  PROT 5.6* 5.8*  ALBUMIN 1.8* 1.8*   No results for input(s): LIPASE, AMYLASE in the last 168 hours. No results for input(s): AMMONIA in the last 168 hours. CBC:  Recent Labs Lab 09/30/14 0012 09/30/14 1045 10/01/14 1818 10/02/14 0830  WBC 11.2* 10.5 16.5* 19.3*  NEUTROABS 8.4* 8.3*  --   --   HGB 7.9* 8.7* 8.6* 7.8*  HCT 25.3* 28.7* 27.4* 25.5*   MCV 84.1 86.2 84.0 83.9  PLT 199 203 244 243   Cardiac Enzymes: No results for input(s): CKTOTAL, CKMB, CKMBINDEX, TROPONINI in the last 168 hours. BNP (last 3 results) No results for input(s): PROBNP in the last 8760 hours. CBG:  Recent Labs Lab 10/01/14 1957 10/01/14 2312 10/02/14 0330 10/02/14 0727 10/02/14 1137  GLUCAP 152* 125* 114* 117* 156*    Recent Results (from the past 240 hour(s))  Wound culture     Status: None   Collection Time: 09/29/14 11:53 PM  Result Value Ref Range Status   Specimen Description WOUND  Final   Special Requests COCCYX  Final   Gram Stain   Final    RARE WBC PRESENT,BOTH PMN AND MONONUCLEAR NO SQUAMOUS EPITHELIAL CELLS SEEN ABUNDANT GRAM NEGATIVE RODS ABUNDANT GRAM POSITIVE COCCI IN PAIRS IN CLUSTERS MODERATE GRAM POSITIVE RODS    Culture   Final    MULTIPLE ORGANISMS PRESENT, NONE PREDOMINANT Note: NO STAPHYLOCOCCUS AUREUS ISOLATED NO GROUP A STREP (S.PYOGENES) ISOLATED Performed at [facility]    Report Status 10/02/2014 FINAL  Final  Urine culture     Status: None   Collection Time: 09/30/14 12:05 AM  Result Value Ref Range Status   Specimen Description URINE, CATHETERIZED  Final   Special Requests NONE  Final  Culture   Final    MULTIPLE SPECIES PRESENT, SUGGEST RECOLLECTION IF CLINICALLY INDICATED   Report Status 10/01/2014 FINAL  Final  Culture, blood (routine x 2)     Status: None (Preliminary result)   Collection Time: 09/30/14 12:15 AM  Result Value Ref Range Status   Specimen Description BLOOD RIGHT ANTECUBITAL  Final   Special Requests   Final    BOTTLES DRAWN AEROBIC AND ANAEROBIC 10CC BLUE 5CC RED   Culture  Setup Time   Final    GRAM POSITIVE RODS IN BOTH AEROBIC AND ANAEROBIC BOTTLES CRITICAL RESULT CALLED TO, READ BACK BY AND VERIFIED WITH: JBOZEMAN,RN AT 5449 10/01/14 BY LBENFIELD    Culture PENDING  Incomplete   Report Status PENDING  Incomplete  Blood culture (routine x 2)     Status: None  (Preliminary result)   Collection Time: 09/30/14 12:27 AM  Result Value Ref Range Status   Specimen Description BLOOD RIGHT HAND  Final   Special Requests BOTTLES DRAWN AEROBIC ONLY 10CC  Final   Culture NO GROWTH 1 DAY  Final   Report Status PENDING  Incomplete         Studies: No results found.      Scheduled Meds: . apixaban  5 mg Oral BID  . cefTAZidime (FORTAZ)  IV  1 g Intravenous Q12H  . insulin aspart  0-9 Units Subcutaneous 6 times per day  . vancomycin  750 mg Intravenous Q24H   Continuous Infusions: . sodium chloride 100 mL/hr at 10/02/14 1037    Principal Problem:   Acute encephalopathy Active Problems:   Essential hypertension, benign   Hypernatremia   AKI (acute kidney injury)   HCAP (healthcare-associated pneumonia)   Decubitus ulcer of sacral region, stage 4   Chronic anemia   Encounter for palliative care    Time spent: 40 minutes.    Vernell Leep, MD, FACP, FHM. [facility] Pager [phone]  If 7PM-7AM, please contact night-coverage [email or web address] Password [email or web address] 10/02/2014, 3:55 PM    LOS: 2 days

## 2014-10-02 NOTE — Consult Note (Signed)
WOC wound follow up Wound type:Right heel Stage II 1 cm x 1 cm x 0.1 cm Left plantar foot, 0.5 cm x 1 cm x 0.1 cm Stage II Sacrum: Unstageable 12 cm x 10.5 cm 100% devitalized eschar and slough to wound bed. Purulent drainage noted at 9 o'clock. Drainage (amount, consistency, odor) Minimal purulent drainage to sacral wound. Foul necrotic odor.   Patient nonresponsive in bed today.  Low Air Loss Mattress in place.  Wounds are unchanged.  No change in orders.  Will not follow at this time.  Please re-consult if needed.  Domenic Moras RN BSN [place] Pager [phone]

## 2014-10-03 DIAGNOSIS — R627 Adult failure to thrive: Secondary | ICD-10-CM

## 2014-10-03 DIAGNOSIS — E876 Hypokalemia: Secondary | ICD-10-CM

## 2014-10-03 LAB — BASIC METABOLIC PANEL
ANION GAP: 6 (ref 5–15)
BUN: 18 mg/dL (ref 6–20)
CHLORIDE: 113 mmol/L — AB (ref 101–111)
CO2: 25 mmol/L (ref 22–32)
Calcium: 7.9 mg/dL — ABNORMAL LOW (ref 8.9–10.3)
Creatinine, Ser: 0.64 mg/dL (ref 0.44–1.00)
Glucose, Bld: 197 mg/dL — ABNORMAL HIGH (ref 65–99)
POTASSIUM: 2.8 mmol/L — AB (ref 3.5–5.1)
SODIUM: 144 mmol/L (ref 135–145)

## 2014-10-03 LAB — CBC
HEMATOCRIT: 24.3 % — AB (ref 36.0–46.0)
Hemoglobin: 7.4 g/dL — ABNORMAL LOW (ref 12.0–15.0)
MCH: 25.3 pg — AB (ref 26.0–34.0)
MCHC: 30.5 g/dL (ref 30.0–36.0)
MCV: 83.2 fL (ref 78.0–100.0)
Platelets: 246 10*3/uL (ref 150–400)
RBC: 2.92 MIL/uL — ABNORMAL LOW (ref 3.87–5.11)
RDW: 15.5 % (ref 11.5–15.5)
WBC: 16.4 10*3/uL — AB (ref 4.0–10.5)

## 2014-10-03 LAB — GLUCOSE, CAPILLARY
GLUCOSE-CAPILLARY: 208 mg/dL — AB (ref 65–99)
Glucose-Capillary: 172 mg/dL — ABNORMAL HIGH (ref 65–99)
Glucose-Capillary: 168 mg/dL — ABNORMAL HIGH (ref 65–99)
Glucose-Capillary: 226 mg/dL — ABNORMAL HIGH (ref 65–99)
Glucose-Capillary: 202 mg/dL — ABNORMAL HIGH (ref 65–99)

## 2014-10-03 LAB — MAGNESIUM: Magnesium: 1.6 mg/dL — ABNORMAL LOW (ref 1.7–2.4)

## 2014-10-03 MED ORDER — INSULIN ASPART 100 UNIT/ML ~~LOC~~ SOLN
0.0000 [IU] | Freq: Every day | SUBCUTANEOUS | Status: DC
  Administered 2014-10-03: 2 [IU] via SUBCUTANEOUS

## 2014-10-03 MED ORDER — MAGNESIUM SULFATE 2 GM/50ML IV SOLN
2.0000 g | Freq: Once | INTRAVENOUS | Status: AC
  Administered 2014-10-03: 2 g via INTRAVENOUS
  Filled 2014-10-03: qty 50

## 2014-10-03 MED ORDER — APIXABAN 5 MG PO TABS
5.0000 mg | ORAL_TABLET | Freq: Two times a day (BID) | ORAL | Status: DC
  Administered 2014-10-03 – 2014-10-05 (×4): 5 mg via ORAL
  Filled 2014-10-03 (×4): qty 1

## 2014-10-03 MED ORDER — POTASSIUM CHLORIDE 10 MEQ/100ML IV SOLN
10.0000 meq | INTRAVENOUS | Status: AC
  Administered 2014-10-03 (×6): 10 meq via INTRAVENOUS
  Filled 2014-10-03 (×6): qty 100

## 2014-10-03 MED ORDER — POTASSIUM CHLORIDE 2 MEQ/ML IV SOLN
INTRAVENOUS | Status: DC
  Administered 2014-10-03: 19:00:00 via INTRAVENOUS
  Filled 2014-10-03 (×2): qty 1000

## 2014-10-03 MED ORDER — INSULIN ASPART 100 UNIT/ML ~~LOC~~ SOLN
0.0000 [IU] | Freq: Three times a day (TID) | SUBCUTANEOUS | Status: DC
  Administered 2014-10-04 – 2014-10-05 (×4): 2 [IU] via SUBCUTANEOUS
  Administered 2014-10-05: 1 [IU] via SUBCUTANEOUS

## 2014-10-03 NOTE — Progress Notes (Addendum)
[facility]phraim Hamburger [facility] 6N Rm 27-[facility] RN Visit- Barbra Sarks RN  This is a related, covered admission to patient's [facility] diagnosis of Alzheimer's Dementia. Code Status is DNR. Patient seen in room laying in bed. Patient does not respond to verbal stimuli or touch. Patient appears comfortable. IVF noted to be infusing through a PIV.Patient appears comfortable and she had a family member sitting with her. Pt. had her eyes open but did not make eye contact or respond to  touch or verbal stimuli.  [facility] will continue to follow and anticipate any discharge needs.  Please call with any questions or concerns.    [facility] Liaison [phone]

## 2014-10-03 NOTE — Progress Notes (Signed)
Pt's daughter, [name] came to see pt while physician was in the room.  Physician to arrange a meeting with family, physician, and palliative care medicine for tomorrow at noon.  Pt's son, [name] is her HCPOA and POA.  Hospice has been following pt at home.  Pt lives in home with her son, [name] and their 3 children.  Pt's husband also lives in the home but has health issues of his own.  They have a paid caregiver in the home for several hours a day.  Paid caregiver provides very good care and has been instrumental in helping hospice with education regarding giving pt meds as prescribed, turning pt etc.   [name], however, is reluctant to accept comfort care only and wants infections, etc treated aggressively.  Daughter, [name] seems more realistic about prognosis.  She plans to be at the meeting.  Hospice will continue to follow pt while she is hospitalized and will follow up with her if she is discharged home.  [place], [place] ext: [phone]

## 2014-10-03 NOTE — Progress Notes (Signed)
MD, patient's family mentioned that she will aspirate while drinking thin liquids.  Please consider an order for swallow evaluation as per family request. Thank you.

## 2014-10-03 NOTE — Progress Notes (Signed)
PROGRESS NOTE    [name] QTM:[id number] DOB: 1940/12/29 DOA: 09/29/2014 PCP: Unice Cobble, MD  HPI/Brief narrative 74 year old female patient with history of advanced dementia, chronic sacral decubitus ulcers, PAF on Apixaban, DM, chronic anemia & thrombocytopenia, on home hospice, presented to the [facility] ED on 09/30/14 with altered mental status and not eating well. Patient recently had hematuria which cleared by itself. In the ED, patient clinically dehydrated, worsened renal function, hypernatremia, chest x-ray suggestive of possible pneumonia, UA-possible UTI. She was admitted for further evaluation and management. Admitting M.D. discussed with patient's daughter who advised minimal labs and try to keep patient as comfortable as possible. DO NOT RESUSCITATE was confirmed.   Assessment/Plan:  Acute encephalopathy - Secondary to acute illness (metabolic abnormalities and infection) complicating underlying advanced dementia. - CT head showed no acute findings. - Patient empirically started on IV vancomycin and Fortaz for suspected pneumonia and UTI. - Mental status improved. Patient seen opening eyes and looking around but nonverbal. As per nursing report, she ate 25% of meals with the caregiver yesterday.  Healthcare associated pneumonia versus aspiration pneumonia - Continue IV vancomycin and Fortaz - Blood culture 1 of 2: Gram-positive rods. Urine culture: Suggestive of contamination. Wound culture pending. - As per family, patient was on pured diet at home. Will change diet to dysphagia 3 diet.  Possible UTI - Continue IV Fortaz - Urine culture: Suggestive of contamination.  Dehydration with hypernatremia - Secondary to poor oral intake - Hypotonic IV fluids. - At risk for recurrent dehydration from advanced dementia and poor oral intake - Sodium without significant improvement. & changed IV fluids to D5W. - Hypernatremia resolved. Continue gentle IV hydration.  Acute  kidney injury - Secondary to dehydration. Resolved.  Hypokalemia - Replace aggressively intravenously and follow BMP.  Chronic anemia - hemoglobin has dropped from 8.6 > 7.8 >7.4. Follow CBC in a.m.   Sacral decubitus ulcer and heel ulcer - Wound care consultation 6/30 appreciated.   Advanced dementia  Uncontrolled type II DM - SSI. Controlled   PAF - Rate controlled - Resume Apixaban  Goals of care - Patient is a hospice appropriate candidate with advanced dementia and multiple severe chronic and acute medical problems with overall poor prognosis. Palliative care consulted and awaiting goals of care meeting. Discussed with hospice team. - Await palliative care team follow-up with family.  - As per nursing, some complicated family dynamics where son, the healthcare power of attorney apparently wishes to continue aggressive care whereas daughters want comfort/hospice care. - Discussed extensively with patient's daughter Mrs. Marlena at bedside on 7/1. Updated care and answered questions. Advised her regarding patient's overall poor prognosis. They're agreeable to family meeting which has been set up for 10/04/14 at [facility] to include palliative care M.D.  ? Colovaginal/rectovaginal fistula - Not a candidate for any aggressive intervention.  Severe protein calorie malnutrition - Will be an ongoing issue secondary to advanced dementia and poor oral intake. Not candidate for and would not recommend artificial feeding.   DVT prophylaxis: Subcutaneous heparin -DC and resume Eliquis Code Status: DO NOT RESUSCITATE Family Communication: discussed extensively with patient's daughter [name] at bedside on 7/1. Disposition Plan: DC home with home hospice pending palliative care consultation   Consultants:  Palliative care consultation  Procedures:  Foley catheter.   Antibiotics:  IV vancomycin 6/28 >  IV Tressie Ellis 6/28 >   Subjective: Eyes open and looking around. Nonverbal. Does  not respond to questions. As per daughter at bedside, mental status  now is at baseline. As per nursing, patient ate 25% meals with caregiver yesterday.  Objective: Filed Vitals:   10/02/14 0520 10/02/14 1412 10/02/14 2133 10/03/14 0518  BP: 104/54 104/64 102/71 104/66  Pulse: 86 84 78 74  Temp: 100.2 F (37.9 C) 98.9 F (37.2 C) 99.4 F (37.4 C) 99 F (37.2 C)  TempSrc: Axillary Axillary Axillary Axillary  Resp: 18 18 17 16   Height:      Weight: 54.432 kg (120 lb)   53 kg (116 lb 13.5 oz)  SpO2: 100% 100% 100% 100%    Intake/Output Summary (Last 24 hours) at 10/03/14 1745 Last data filed at 10/03/14 1600  Gross per 24 hour  Intake 2868.33 ml  Output    650 ml  Net 2218.33 ml   Filed Weights   10/01/14 0612 10/02/14 0520 10/03/14 0518  Weight: 52.9 kg (116 lb 10 oz) 54.432 kg (120 lb) 53 kg (116 lb 13.5 oz)     Exam:  General exam: Elderly frail female lying comfortably supine in bed Respiratory system: Poor inspiratory effort but seems clear to auscultation except in the bases with reduced breath sounds. No increased work of breathing. Cardiovascular system: S1 & S2 heard, irregularly irregular. No JVD, murmurs, gallops, clicks or pedal edema. Gastrointestinal system: Abdomen is nondistended, soft and nontender. Normal bowel sounds heard. Foley catheter with cloudy urine.  Central nervous system: Eyes open and looking around but not communicative or following commands. Nonverbal. No focal neurological deficits.  Extremities: No limb movements. Possible contractures or increased tone of all limbs.   Data Reviewed: Basic Metabolic Panel:  Recent Labs Lab 09/30/14 0012 09/30/14 1045 10/01/14 1818 10/02/14 0830 10/03/14 0338  NA 147* 148* 148* 147* 144  K 3.2* 2.9* 3.0* 3.6 2.8*  CL 113* 117* 117* 115* 113*  CO2 23 23 23 25 25   GLUCOSE 292* 280* 215* 160* 197*  BUN 45* 39* 30* 22* 18  CREATININE 1.26* 0.97 0.87 0.87 0.64  CALCIUM 7.7* 7.7* 8.2* 8.1* 7.9*  MG   --   --   --   --  1.6*   Liver Function Tests:  Recent Labs Lab 09/30/14 0012 09/30/14 1045  AST 26 21  ALT 19 19  ALKPHOS 103 91  BILITOT 1.2 0.9  PROT 5.6* 5.8*  ALBUMIN 1.8* 1.8*   No results for input(s): LIPASE, AMYLASE in the last 168 hours. No results for input(s): AMMONIA in the last 168 hours. CBC:  Recent Labs Lab 09/30/14 0012 09/30/14 1045 10/01/14 1818 10/02/14 0830 10/03/14 0338  WBC 11.2* 10.5 16.5* 19.3* 16.4*  NEUTROABS 8.4* 8.3*  --   --   --   HGB 7.9* 8.7* 8.6* 7.8* 7.4*  HCT 25.3* 28.7* 27.4* 25.5* 24.3*  MCV 84.1 86.2 84.0 83.9 83.2  PLT 199 203 244 243 246   Cardiac Enzymes: No results for input(s): CKTOTAL, CKMB, CKMBINDEX, TROPONINI in the last 168 hours. BNP (last 3 results) No results for input(s): PROBNP in the last 8760 hours. CBG:  Recent Labs Lab 10/02/14 2342 10/03/14 0401 10/03/14 0745 10/03/14 1155 10/03/14 1712  GLUCAP 158* 172* 168* 208* 226*    Recent Results (from the past 240 hour(s))  Wound culture     Status: None   Collection Time: 09/29/14 11:53 PM  Result Value Ref Range Status   Specimen Description WOUND  Final   Special Requests COCCYX  Final   Gram Stain   Final    RARE WBC PRESENT,BOTH PMN AND MONONUCLEAR NO  SQUAMOUS EPITHELIAL CELLS SEEN ABUNDANT GRAM NEGATIVE RODS ABUNDANT GRAM POSITIVE COCCI IN PAIRS IN CLUSTERS MODERATE GRAM POSITIVE RODS    Culture   Final    MULTIPLE ORGANISMS PRESENT, NONE PREDOMINANT Note: NO STAPHYLOCOCCUS AUREUS ISOLATED NO GROUP A STREP (S.PYOGENES) ISOLATED Performed at [facility]    Report Status 10/02/2014 FINAL  Final  Urine culture     Status: None   Collection Time: 09/30/14 12:05 AM  Result Value Ref Range Status   Specimen Description URINE, CATHETERIZED  Final   Special Requests NONE  Final   Culture   Final    MULTIPLE SPECIES PRESENT, SUGGEST RECOLLECTION IF CLINICALLY INDICATED   Report Status 10/01/2014 FINAL  Final  Culture, blood (routine x  2)     Status: None (Preliminary result)   Collection Time: 09/30/14 12:15 AM  Result Value Ref Range Status   Specimen Description BLOOD RIGHT ANTECUBITAL  Final   Special Requests   Final    BOTTLES DRAWN AEROBIC AND ANAEROBIC 10CC BLUE 5CC RED   Culture  Setup Time   Final    GRAM POSITIVE RODS IN BOTH AEROBIC AND ANAEROBIC BOTTLES CRITICAL RESULT CALLED TO, READ BACK BY AND VERIFIED WITH: JBOZEMAN,RN AT 0850 10/01/14 BY LBENFIELD    Culture NO GROWTH 3 DAYS  Final   Report Status PENDING  Incomplete  Blood culture (routine x 2)     Status: None (Preliminary result)   Collection Time: 09/30/14 12:27 AM  Result Value Ref Range Status   Specimen Description BLOOD RIGHT HAND  Final   Special Requests BOTTLES DRAWN AEROBIC ONLY 10CC  Final   Culture NO GROWTH 3 DAYS  Final   Report Status PENDING  Incomplete         Studies: No results found.      Scheduled Meds: . cefTAZidime (FORTAZ)  IV  1 g Intravenous Q12H  . heparin subcutaneous  5,000 Units Subcutaneous 3 times per day  . insulin aspart  0-9 Units Subcutaneous 6 times per day  . vancomycin  750 mg Intravenous Q24H   Continuous Infusions: . dextrose 5 % with KCl 20 mEq / L 20 mEq (10/03/14 1006)    Principal Problem:   Acute encephalopathy Active Problems:   Essential hypertension, benign   Hypernatremia   AKI (acute kidney injury)   HCAP (healthcare-associated pneumonia)   Decubitus ulcer of sacral region, stage 4   Chronic anemia   Encounter for palliative care    Time spent: 40 minutes.    Vernell Leep, MD, FACP, FHM. [facility] Pager [phone]  If 7PM-7AM, please contact night-coverage [email or web address] Password [email or web address] 10/03/2014, 5:45 PM    LOS: 3 days

## 2014-10-03 DEATH — deceased

## 2014-10-04 ENCOUNTER — Encounter (HOSPITAL_COMMUNITY): Payer: Self-pay

## 2014-10-04 DIAGNOSIS — Z515 Encounter for palliative care: Secondary | ICD-10-CM

## 2014-10-04 DIAGNOSIS — J690 Pneumonitis due to inhalation of food and vomit: Secondary | ICD-10-CM

## 2014-10-04 LAB — BASIC METABOLIC PANEL
Anion gap: 7 (ref 5–15)
BUN: 12 mg/dL (ref 6–20)
CALCIUM: 7.7 mg/dL — AB (ref 8.9–10.3)
CO2: 24 mmol/L (ref 22–32)
Chloride: 105 mmol/L (ref 101–111)
Creatinine, Ser: 0.71 mg/dL (ref 0.44–1.00)
GFR calc Af Amer: >60 mL/min (ref >60)
GFR calc non Af Amer: >60 mL/min (ref >60)
GLUCOSE: 185 mg/dL — AB (ref 65–99)
Potassium: 3.6 mmol/L (ref 3.5–5.1)
Sodium: 136 mmol/L (ref 135–145)

## 2014-10-04 LAB — MAGNESIUM: Magnesium: 2 mg/dL (ref 1.7–2.4)

## 2014-10-04 LAB — CBC
HCT: 25.5 % — ABNORMAL LOW (ref 36.0–46.0)
Hemoglobin: 7.8 g/dL — ABNORMAL LOW (ref 12.0–15.0)
MCH: 25.4 pg — AB (ref 26.0–34.0)
MCHC: 30.6 g/dL (ref 30.0–36.0)
MCV: 83.1 fL (ref 78.0–100.0)
Platelets: 285 10*3/uL (ref 150–400)
RBC: 3.07 MIL/uL — AB (ref 3.87–5.11)
RDW: 15.6 % — ABNORMAL HIGH (ref 11.5–15.5)
WBC: 15.1 10*3/uL — ABNORMAL HIGH (ref 4.0–10.5)

## 2014-10-04 LAB — TYPE AND SCREEN
ABO/RH(D): A POS
Antibody Screen: NEGATIVE

## 2014-10-04 LAB — GLUCOSE, CAPILLARY
Glucose-Capillary: 162 mg/dL — ABNORMAL HIGH (ref 65–99)
Glucose-Capillary: 171 mg/dL — ABNORMAL HIGH (ref 65–99)
Glucose-Capillary: 169 mg/dL — ABNORMAL HIGH (ref 65–99)
Glucose-Capillary: 159 mg/dL — ABNORMAL HIGH (ref 65–99)

## 2014-10-04 NOTE — Progress Notes (Addendum)
[facility]ra[facility] Hamburger [facility] 6N Rm 27-[facility] [place] RN Visit- Barbra Sarks RN  This is a related, covered admission to patient's [facility] diagnosis of Alzheimer's Dementia. Code Status is DNR. Patient seen in room laying in bed. Patient does not respond to verbal stimuli or touch. Patient appears comfortable. IVF noted to be infusing through a PIV.   Family met with Dr. Algis Liming  and Dr. Rowe Pavy to discuss ongoing goals of care and realistic expectations. [facility] RN present. It was decided that the pt. will be discharged home tomorrow. The foley catheter will remain and she will go home on O2 at 2L n/c. Family reassured that hospice will be available for support and to call any time as needed. Alternating pressure mattress and O2 ordered through [place]. Most form completed and original given to the family and a copy placed in the shadow chart.  Please call with any questions or concerns.    [facility] Liaison [phone]      Revision History

## 2014-10-04 NOTE — Evaluation (Signed)
Clinical/Bedside Swallow Evaluation Patient Details  Name: [name] MRN: [id number] Date of Birth: [birth year]  Today's Date: 10/04/2014 Time: SLP Start Time (ACUTE ONLY): 1205 SLP Stop Time (ACUTE ONLY): 1212 SLP Time Calculation (min) (ACUTE ONLY): 7 min  Past Medical History:  Past Medical History  Diagnosis Date  . Dysphagia   . Diabetes mellitus   . Alzheimer disease   . Hypokalemia   . Anemia   . 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 DVT (deep venous thrombosis)     [facility]  . Hypertension   . Abnormality of gait 06/13/2014  . Coronary artery disease   . Presence of permanent cardiac pacemaker   . Peripheral vascular disease   . Chronic kidney disease   . Seizures   . Medical history non-contributory   . HCAP (healthcare-associated pneumonia) 09/30/2014   Past Surgical History:  Past Surgical History  Procedure Laterality Date  . Syncope  05/2001    pacer  . Pacemaker placement    . Insert / replace / remove pacemaker    . No past surgeries     HPI:  [name] is a 74 y.o. female history of advanced dementia, chronic sacral decubitus ulcers, paroxysmal atrial fibrillation on Apixaban, diabetes mellitus, chronic anemia and thrombocytopenia admitted with altered mental status and poor oral intake.not eating well. Patient is under hospice care. CT no acute intracranial process, generalized cerebral atrophy with chronic microvascular ischemic disease, remote lacunar infarcts involving the bilateral basal ganglia, with additional remote right cerebellar infarcts. CXR Left base consolidation consistent with pneumonia.   Assessment / Plan / Recommendation Clinical Impression  Caregiver (past 5 months) present and provided history re: pt's recent function/swallow status. Alertness gradually lessening this week. Caregiver reported assisting with meal Thursday when pt coughed following thin liquid, stopped feeding and notified nursing. Pt has been lethargic/not alert  2-3 days. SLP unable to arouse with verbal/tactile/visual stimulation. Meeting scheduled (12:00/12:30 today) with family, caregiver, MD and hospice RN. Caregiver reported attempting to give small bite yogurt prior to SLP arrival. SLP educated not to attempt feeding unless pt adequately awake/responsive. Likely comfort care treatment following meeting; suspect pt will not become alert enough for family education for swallow precautions. SLP will sign off at this time.      Aspiration Risk  Severe    Diet Recommendation NPO (NPO unless adequately awake/alert)        Other  Recommendations Oral Care Recommendations: Oral care BID   Follow Up Recommendations       Frequency and Duration        Pertinent Vitals/Pain Appears comfortable         Swallow Study           Oral/Motor/Sensory Function Overall Oral Motor/Sensory Function:  (unable to adequately assess)   Ice Chips Ice chips: Not tested   Thin Liquid Thin Liquid: Not tested    Nectar Thick Nectar Thick Liquid: Not tested   Honey Thick Honey Thick Liquid: Not tested   Puree Puree: Not tested   Solid   GO    Solid: Not tested       [name] 10/04/2014,12:29 PM   [name].Ed [employer] [phone]

## 2014-10-04 NOTE — Progress Notes (Signed)
ANTIBIOTIC CONSULT NOTE - FOLLOW UP  Pharmacy Consult for Vancomycin and Fortaz Indication: HCAP and UTI  Allergies  Allergen Reactions  . Nsaids Other (See Comments)    GI Bleeding  . Ace Inhibitors Cough    Patient Measurements: Height: 5\' 5"  (165.1 cm) Weight: 133 lb (60.328 kg) IBW/kg (Calculated) : 57  Vital Signs: Temp: 98.6 F (37 C) (07/02 0601) Temp Source: Oral (07/02 0601) BP: 105/57 mmHg (07/02 0601) Pulse Rate: 66 (07/02 0601) Intake/Output from previous day: 07/01 0701 - 07/02 0700 In: 1913.3 [P.O.:60; I.V.:1153.3; IV Piggyback:700] Out: 800 [Urine:800] Intake/Output from this shift:    Labs:  Recent Labs  10/02/14 0830 10/03/14 0338 10/04/14 0349  WBC 19.3* 16.4* 15.1*  HGB 7.8* 7.4* 7.8*  PLT 243 246 285  CREATININE 0.87 0.64 0.71   Estimated Creatinine Clearance: 56.4 mL/min (by C-G formula based on Cr of 0.71). No results for input(s): VANCOTROUGH, VANCOPEAK, VANCORANDOM, GENTTROUGH, GENTPEAK, GENTRANDOM, TOBRATROUGH, TOBRAPEAK, TOBRARND, AMIKACINPEAK, AMIKACINTROU, AMIKACIN in the last 72 hours.   Microbiology: Recent Results (from the past 720 hour(s))  Wound culture     Status: None   Collection Time: 09/29/14 11:53 PM  Result Value Ref Range Status   Specimen Description WOUND  Final   Special Requests COCCYX  Final   Gram Stain   Final    RARE WBC PRESENT,BOTH PMN AND MONONUCLEAR NO SQUAMOUS EPITHELIAL CELLS SEEN ABUNDANT GRAM NEGATIVE RODS ABUNDANT GRAM POSITIVE COCCI IN PAIRS IN CLUSTERS MODERATE GRAM POSITIVE RODS    Culture   Final    MULTIPLE ORGANISMS PRESENT, NONE PREDOMINANT Note: NO STAPHYLOCOCCUS AUREUS ISOLATED NO GROUP A STREP (S.PYOGENES) ISOLATED Performed at [facility]    Report Status 10/02/2014 FINAL  Final  Urine culture     Status: None   Collection Time: 09/30/14 12:05 AM  Result Value Ref Range Status   Specimen Description URINE, CATHETERIZED  Final   Special Requests NONE  Final   Culture    Final    MULTIPLE SPECIES PRESENT, SUGGEST RECOLLECTION IF CLINICALLY INDICATED   Report Status 10/01/2014 FINAL  Final  Culture, blood (routine x 2)     Status: None (Preliminary result)   Collection Time: 09/30/14 12:15 AM  Result Value Ref Range Status   Specimen Description BLOOD RIGHT ANTECUBITAL  Final   Special Requests   Final    BOTTLES DRAWN AEROBIC AND ANAEROBIC 10CC BLUE 5CC RED   Culture  Setup Time   Final    GRAM POSITIVE RODS IN BOTH AEROBIC AND ANAEROBIC BOTTLES CRITICAL RESULT CALLED TO, READ BACK BY AND VERIFIED WITH: JBOZEMAN,RN AT 0850 10/01/14 BY LBENFIELD    Culture NO GROWTH 3 DAYS  Final   Report Status PENDING  Incomplete  Blood culture (routine x 2)     Status: None (Preliminary result)   Collection Time: 09/30/14 12:27 AM  Result Value Ref Range Status   Specimen Description BLOOD RIGHT HAND  Final   Special Requests BOTTLES DRAWN AEROBIC ONLY 10CC  Final   Culture NO GROWTH 3 DAYS  Final   Report Status PENDING  Incomplete     [name], [name] Vanc/Fortaz day #5 for HCAP and UTI. CXR showed left base consolidation consistent with PNA. UA showed many bacteria, large leukocytes, positive nitrite. UC = multiple species- ? contaminant.  Wound cx with multiple organisms, none predominant. 1 of 2 BC with GP rods - ? Contaminant.  Afebrile, WBC 15.1 slightly- improved;  creat 0.7. Per MD note mental status improved to baseline, still nonverbal.   6/27 wound cx>> mult orgs. Final 6/28 Blood cx > 1/2 GPRs 6/28 Urine cx > multiple species present  6/28 Ceftazidime >> 6/28 Vancomycin >>   Goal of Therapy:  Vancomycin trough level 15-20 mcg/ml  Resolution of infection  Plan:  Continue ceftazidime 1g IV Q12 continue vancomycin to 750mg  IV Q24 Monitor clinical picture, renal function, C&S F/U abx deescalation  [name], [name] [phone] 10/04/2014 8:03 AM

## 2014-10-04 NOTE — Progress Notes (Signed)
PROGRESS NOTE    [name] ZLD:[id number] DOB: 1940/08/14 DOA: 09/29/2014 PCP: Unice Cobble, MD  HPI/Brief narrative 74 year old female patient with history of advanced dementia, chronic sacral decubitus ulcers, PAF on Apixaban, DM, chronic anemia & thrombocytopenia, on home hospice, presented to [facility] ED on 09/30/14 with altered mental status and not eating well. Patient recently had hematuria which cleared by itself. In the ED, patient clinically dehydrated, worsened renal function, hypernatremia, chest x-ray suggestive of possible pneumonia, UA-possible UTI. She was admitted for further evaluation and management. Admitting M.D. discussed with patient's daughter who advised minimal labs and try to keep patient as comfortable as possible. DO NOT RESUSCITATE was confirmed.   Assessment/Plan:  Acute encephalopathy - Secondary to acute illness (metabolic abnormalities and infection) complicating underlying advanced dementia. - CT head showed no acute findings. - Patient empirically started on IV vancomycin and Fortaz for suspected pneumonia and UTI. - Mental status improved. Patient seen opening eyes and looking around but nonverbal. As per nursing report, she ate 25% of meals with the caregiver 6/30. - As per family, even though mental status is improved compared to admission, still not what it was a week prior to admission.  Healthcare associated pneumonia versus aspiration pneumonia - Continue IV vancomycin and Fortaz - Blood culture 1 of 2: Gram-positive rods. Urine culture: Suggestive of contamination. Wound culture pending. - As per family, patient was on pured diet at home. Changed diet to dysphagia 3 diet.  Possible UTI - Continue IV Fortaz - Urine culture: Suggestive of contamination.  Dehydration with hypernatremia - Secondary to poor oral intake - Hypotonic IV fluids. - At risk for recurrent dehydration from advanced dementia and poor oral intake - Sodium without  significant improvement. & changed IV fluids to D5W. - Hypernatremia resolved. DC IV fluids.  Acute kidney injury - Secondary to dehydration. Resolved.  Hypokalemia - Replaced aggressively.  Chronic anemia - Hemoglobin stable in the last 24 hours.  Sacral decubitus ulcer and heel ulcer - Wound care consultation 6/30 appreciated.   Advanced dementia  Uncontrolled type II DM - SSI. Controlled   PAF - Rate controlled - Resumed Apixaban  Goals of care - Patient is a hospice appropriate candidate with advanced dementia and multiple severe chronic and acute medical problems with overall poor prognosis. Palliative care consulted and awaiting goals of care meeting. Discussed with hospice team. - The undersigned and Palliative MD had family meeting with son Legrand Como, daughter [name] and his fiance [name], patient's caregiver and hospice nurse. Discussed at length regarding patient's overall poor prognosis and no likelihood for consistent and meaningful recovery or quality of life. Finally they are agreeable to returning home tomorrow for comfort care only-no labs, feeding tubes, no return to hospital in the future. They do wish to complete course of antibiotics.  All questions were answered.  ? Colovaginal/rectovaginal fistula - Not a candidate for any aggressive intervention.  Severe protein calorie malnutrition - Will be an ongoing issue secondary to advanced dementia and poor oral intake. Not candidate for and would not recommend artificial feeding.   DVT prophylaxis: Subcutaneous heparin -DC and resumed Eliquis Code Status: DO NOT RESUSCITATE Family Communication: Had family meeting 7/2 Disposition Plan: DC home with home hospice 7/3   Consultants:  Palliative care consultation  Procedures:  Foley catheter.   Antibiotics:  IV vancomycin 6/28 >  IV Tressie Ellis 6/28 >   Subjective: Eyes open and looking around. Nonverbal. Does not respond to questions.    Objective: Filed Vitals:  10/02/14 2133 10/03/14 0518 10/03/14 2128 10/04/14 0601  BP: 102/71 104/66 103/57 105/57  Pulse: 78 74 86 66  Temp: 99.4 F (37.4 C) 99 F (37.2 C) 98.4 F (36.9 C) 98.6 F (37 C)  TempSrc: Axillary Axillary Oral Oral  Resp: 17 16 14 16   Height:      Weight:  53 kg (116 lb 13.5 oz)  60.328 kg (133 lb)  SpO2: 100% 100% 100% 92%    Intake/Output Summary (Last 24 hours) at 10/04/14 1405 Last data filed at 10/04/14 0925  Gross per 24 hour  Intake 1621.33 ml  Output    800 ml  Net 821.33 ml   Filed Weights   10/02/14 0520 10/03/14 0518 10/04/14 0601  Weight: 54.432 kg (120 lb) 53 kg (116 lb 13.5 oz) 60.328 kg (133 lb)     Exam:  General exam: Elderly frail female lying comfortably supine in bed Respiratory system: Poor inspiratory effort but seems clear to auscultation except in the bases with reduced breath sounds. No increased work of breathing. Cardiovascular system: S1 & S2 heard, irregularly irregular. No JVD, murmurs, gallops, clicks or pedal edema. Gastrointestinal system: Abdomen is nondistended, soft and nontender. Normal bowel sounds heard. Foley catheter with cloudy urine.  Central nervous system: Eyes open and looking around but not communicative or following commands. Nonverbal. No focal neurological deficits.  Extremities: No limb movements. Possible contractures or increased tone of all limbs.   Data Reviewed: Basic Metabolic Panel:  Recent Labs Lab 09/30/14 1045 10/01/14 1818 10/02/14 0830 10/03/14 0338 10/04/14 0349  NA 148* 148* 147* 144 136  K 2.9* 3.0* 3.6 2.8* 3.6  CL 117* 117* 115* 113* 105  CO2 23 23 25 25 24   GLUCOSE 280* 215* 160* 197* 185*  BUN 39* 30* 22* 18 12  CREATININE 0.97 0.87 0.87 0.64 0.71  CALCIUM 7.7* 8.2* 8.1* 7.9* 7.7*  MG  --   --   --  1.6* 2.0   Liver Function Tests:  Recent Labs Lab 09/30/14 0012 09/30/14 1045  AST 26 21  ALT 19 19  ALKPHOS 103 91  BILITOT 1.2 0.9  PROT 5.6* 5.8*   ALBUMIN 1.8* 1.8*   No results for input(s): LIPASE, AMYLASE in the last 168 hours. No results for input(s): AMMONIA in the last 168 hours. CBC:  Recent Labs Lab 09/30/14 0012 09/30/14 1045 10/01/14 1818 10/02/14 0830 10/03/14 0338 10/04/14 0349  WBC 11.2* 10.5 16.5* 19.3* 16.4* 15.1*  NEUTROABS 8.4* 8.3*  --   --   --   --   HGB 7.9* 8.7* 8.6* 7.8* 7.4* 7.8*  HCT 25.3* 28.7* 27.4* 25.5* 24.3* 25.5*  MCV 84.1 86.2 84.0 83.9 83.2 83.1  PLT 199 203 244 243 246 285   Cardiac Enzymes: No results for input(s): CKTOTAL, CKMB, CKMBINDEX, TROPONINI in the last 168 hours. BNP (last 3 results) No results for input(s): PROBNP in the last 8760 hours. CBG:  Recent Labs Lab 10/03/14 1155 10/03/14 1712 10/03/14 2127 10/04/14 0756 10/04/14 1148  GLUCAP 208* 226* 202* 162* 171*    Recent Results (from the past 240 hour(s))  Wound culture     Status: None   Collection Time: 09/29/14 11:53 PM  Result Value Ref Range Status   Specimen Description WOUND  Final   Special Requests COCCYX  Final   Gram Stain   Final    RARE WBC PRESENT,BOTH PMN AND MONONUCLEAR NO SQUAMOUS EPITHELIAL CELLS SEEN ABUNDANT GRAM NEGATIVE RODS ABUNDANT GRAM POSITIVE COCCI IN PAIRS  IN CLUSTERS MODERATE GRAM POSITIVE RODS    Culture   Final    MULTIPLE ORGANISMS PRESENT, NONE PREDOMINANT Note: NO STAPHYLOCOCCUS AUREUS ISOLATED NO GROUP A STREP (S.PYOGENES) ISOLATED Performed at [facility]    Report Status 10/02/2014 FINAL  Final  Urine culture     Status: None   Collection Time: 09/30/14 12:05 AM  Result Value Ref Range Status   Specimen Description URINE, CATHETERIZED  Final   Special Requests NONE  Final   Culture   Final    MULTIPLE SPECIES PRESENT, SUGGEST RECOLLECTION IF CLINICALLY INDICATED   Report Status 10/01/2014 FINAL  Final  Culture, blood (routine x 2)     Status: None (Preliminary result)   Collection Time: 09/30/14 12:15 AM  Result Value Ref Range Status   Specimen  Description BLOOD RIGHT ANTECUBITAL  Final   Special Requests   Final    BOTTLES DRAWN AEROBIC AND ANAEROBIC 10CC BLUE 5CC RED   Culture  Setup Time   Final    GRAM POSITIVE RODS IN BOTH AEROBIC AND ANAEROBIC BOTTLES CRITICAL RESULT CALLED TO, READ BACK BY AND VERIFIED WITH: JBOZEMAN,RN AT 4008 10/01/14 BY LBENFIELD    Culture   Final    DIPHTHEROIDS(CORYNEBACTERIUM SPECIES) AEROBIC BOTTLE ONLY GRAM NEGATIVE COCCOBACILLI ANAEROBIC BOTTLE ONLY CRITICAL RESULT CALLED TO, READ BACK BY AND VERIFIED WITH: B HALL,RN AT 1145 10/04/14 BY L BENFIELD    Report Status PENDING  Incomplete  Blood culture (routine x 2)     Status: None (Preliminary result)   Collection Time: 09/30/14 12:27 AM  Result Value Ref Range Status   Specimen Description BLOOD RIGHT HAND  Final   Special Requests BOTTLES DRAWN AEROBIC ONLY 10CC  Final   Culture NO GROWTH 4 DAYS  Final   Report Status PENDING  Incomplete         Studies: No results found.      Scheduled Meds: . apixaban  5 mg Oral BID  . cefTAZidime (FORTAZ)  IV  1 g Intravenous Q12H  . insulin aspart  0-5 Units Subcutaneous QHS  . insulin aspart  0-9 Units Subcutaneous TID WC  . vancomycin  750 mg Intravenous Q24H   Continuous Infusions: . dextrose 5 % with kcl 60 mL/hr at 10/04/14 1230    Principal Problem:   Acute encephalopathy Active Problems:   Essential hypertension, benign   Hypernatremia   AKI (acute kidney injury)   HCAP (healthcare-associated pneumonia)   Decubitus ulcer of sacral region, stage 4   Chronic anemia   Encounter for palliative care    Time spent: 40 minutes.    Vernell Leep, MD, FACP, FHM. [facility] Pager [phone]  If 7PM-7AM, please contact night-coverage [email or web address] Password [email or web address] 10/04/2014, 2:05 PM    LOS: 4 days

## 2014-10-04 NOTE — Progress Notes (Signed)
Daily Progress Note   Patient Name: [name]       Date: 10/04/2014 DOB: [birth year]  Age: 74 y.o. MRN#: [id number] Attending Physician: Kristy Jansky, MD Primary Care Physician: Kristy Cobble, MD Admit Date: 09/29/2014  Reason for Consultation/Follow-up: Establishing goals of care  Subjective:  patient is reported to have opened her eyes in response to her daughter.   Interval Events: Lab levels noted.   Family meeting;  Dr [name], the [place], [place] from [facility], met with the patient's [name] fiance, patient's daughter and patient's caregiver.   Discussed the patient's multiple co morbidities such as UTI, PNA, underlying dementia with non verbal and bedbound status at baseline. Discussed patient's wound, bacteremia and possible fistula. Discussed patient's frailty and lack of oral intake.   Goals of care discussed in great detail. All questions answered.   PLAN: DNR DNI  Comfort measures only Full hospice enrollment Do not re hospitalize MOST form outlining DNAR, comfort measures, no IVF, no PEG, no antibiotics filled out and signed by [facility] [name].  To be D/C home with hospice in am.    Length of Stay: 4 days  Current Medications: Scheduled Meds:  . apixaban  5 mg Oral BID  . cefTAZidime (FORTAZ)  IV  1 g Intravenous Q12H  . insulin aspart  0-5 Units Subcutaneous QHS  . insulin aspart  0-9 Units Subcutaneous TID WC  . vancomycin  750 mg Intravenous Q24H    Continuous Infusions: . dextrose 5 % with kcl 60 mL/hr at 10/04/14 1230    PRN Meds: [DISCONTINUED] acetaminophen **OR** acetaminophen, morphine CONCENTRATE  Palliative Performance Scale: 10%     Vital Signs: BP 105/57 mmHg  Pulse 66  Temp(Src) 98.6 F (37 C) (Oral)  Resp 16  Ht $R'5\' 5"'Fd$  (1.651 m)  Wt 60.328 kg (133 lb)  BMI 22.13 kg/m2  SpO2 92% SpO2: SpO2: 92 % O2 Device: O2 Device: Nasal Cannula O2 Flow Rate: O2 Flow Rate (L/min): 2 L/min  Intake/output summary:    Intake/Output Summary (Last 24 hours) at 10/04/14 1327 Last data filed at 10/04/14 0925  Gross per 24 hour  Intake 1621.33 ml  Output    800 ml  Net 821.33 ml   LBM:   Baseline Weight: Weight: 49.896 kg (110 lb) Most recent weight: Weight: 60.328 kg (133 lb)  Physical Exam:      Frail unresponsive lady Shallow resp anteriorly S 1 S2 Abdomen soft   opens eyes      Additional Data Reviewed: Recent Labs     10/03/14  0338  10/04/14  0349  WBC  16.4*  15.1*  HGB  7.4*  7.8*  PLT  246  285  NA  144  136  BUN  18  12  CREATININE  0.64  0.71     Problem List:  Patient Active Problem List   Diagnosis Date Noted  . Encounter for palliative care   . HCAP (healthcare-associated pneumonia) 09/30/2014  . Decubitus ulcer of sacral region, stage 4 09/30/2014  . Chronic anemia 09/30/2014  . Acute kidney injury (nontraumatic)   . Weakness generalized 07/25/2014  . Dementia with behavioral disturbance 07/25/2014  . DNR (do not resuscitate)   . Hypoglycemia   . Acute renal failure syndrome   . Urinary tract infectious disease   . Metabolic encephalopathy   . Thrombocythemia   . DVT (deep venous thrombosis)   . Altered mental state 07/18/2014  . Abnormality of gait 06/13/2014  .  Type II or unspecified type diabetes mellitus with peripheral circulatory disorders, uncontrolled(250.72) 12/04/2013  . Heel ulcer 11/24/2013  . AKI (acute kidney injury) 11/24/2013  . Protein-calorie malnutrition, severe 08/08/2013  . Acute encephalopathy 08/07/2013  . Hypernatremia 08/06/2013  . ARF (acute renal failure) 08/06/2013  . Sepsis 08/06/2013  . UTI (lower urinary tract infection) 08/06/2013  . Palliative care encounter 08/06/2013  . Hyperosmolar (nonketotic) coma 08/06/2013  . Non-compliance 07/24/2013  . Encounter for therapeutic drug monitoring 05/07/2013  . Paroxysmal atrial fibrillation 04/22/2013  . Syncope 04/20/2013  . 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 Long term current use  of anticoagulant 06/02/2010  . DYSLIPIDEMIA 10/06/2009  . Essential hypertension, benign 10/06/2009  . CARDIAC PACEMAKER IN SITU 10/06/2009  . Type II or unspecified type diabetes mellitus with neurological manifestations, uncontrolled 07/21/2008  . DYSPHAGIA, UNSPECIFIED 12/15/2006  . ANEMIA-NOS 08/29/2006  . Alzheimer's disease 08/29/2006     Palliative Care Assessment & Plan    Code Status:  DNR  Goals of Care:   Comfort measures only. D/c home with full hospice support in 24 hours.   Desire for further Chaplaincy support:no  3. Symptom Management:   none needed  4. Palliative Prophylaxis:  Stool Softner: yes  5. Prognosis: few weeks.  5. Discharge Planning: home with hospice in am    Thank you for allowing the Palliative Medicine Team to assist in the care of this patient.   Time In: 1220 Time Out: 1330 Total Time 70 Prolonged Time Billed  no     Greater than 50%  of this time was spent counseling and coordinating care related to the above assessment and plan.   Kristy Chance, MD  10/04/2014, 1:27 PM  [phone] Please contact Palliative Medicine Team phone at [phone] for questions and concerns.

## 2014-10-05 DIAGNOSIS — L89154 Pressure ulcer of sacral region, stage 4: Secondary | ICD-10-CM

## 2014-10-05 LAB — GLUCOSE, CAPILLARY
Glucose-Capillary: 160 mg/dL — ABNORMAL HIGH (ref 65–99)
Glucose-Capillary: 122 mg/dL — ABNORMAL HIGH (ref 65–99)

## 2014-10-05 LAB — CULTURE, BLOOD (ROUTINE X 2): Culture: NO GROWTH

## 2014-10-05 NOTE — Progress Notes (Addendum)
Discharge instructions gone over with patient's daughter. Home medications gone over. [facility] is following patient. Oxygen and air mattress has been delivered to patient's home address. No prescriptions were needed at this time. Comfort feeds, catheter care, and reasons to call the doctor were gone over. The daughter verbalized understanding of the instructions and received a copy. [facility] was called to transport patient home.

## 2014-10-05 NOTE — Progress Notes (Signed)
[facility] visit - Erling Conte, LCSW  This is a related, covered admission Dx Alzheimer's Dementia, Code Status DNR.  Chart reviewed and received report from RN. Met with niece Jeannene Patella and daughter Lorre Nick at bedside. Both confirm plan to transfer home today and oxygen and gel overlay have been delivered to home. Pam and Lorre Nick have been talking with Legrand Como by phone. Per RN, CSW has been contacted for [facility]. Family aware and agreeable. Family aware [facility] RN will see patient at home tomorrow. They have number to call if [facility] is needed sooner.   Please call with questions or concerns.  Erling Conte, [facility] Liaison  [phone]

## 2014-10-05 NOTE — Discharge Summary (Addendum)
Physician Discharge Summary  [name]:[id number] DOB: 10/06/1940 DOA: 09/29/2014  PCP: Kristy Cobble, MD  Admit date: 09/29/2014 Discharge date: 10/05/2014  Time spent: Greater than 30 minutes  Recommendations for Outpatient Follow-up:  1. Please contact home hospice M.D. for any further hospice needs. 2. Patient will be discharged with indwelling Foley catheter for comfort. 3. Patient will be discharged with oxygen via nasal cannula for comfort.  Discharge Diagnoses:  Principal Problem:   Acute encephalopathy Active Problems:   Essential hypertension, benign   Hypernatremia   AKI (acute kidney injury)   HCAP (healthcare-associated pneumonia)   Decubitus ulcer of sacral region, stage 4   Chronic anemia   Encounter for palliative care   Aspiration pneumonia   Discharge Condition: Improved & Stable  Diet recommendation: Soft diet or comfort feeds.  Filed Weights   10/03/14 0518 10/04/14 0601 10/05/14 0605  Weight: 53 kg (116 lb 13.5 oz) 60.328 kg (133 lb) 60.328 kg (133 lb)    History of present illness:  74 year old female patient with history of advanced dementia, chronic sacral decubitus ulcers, PAF on Apixaban, DM, chronic anemia & thrombocytopenia, on home hospice, presented to the [facility] ED on 09/30/14 with altered mental status and not eating well. Patient recently had hematuria which cleared by itself. In the ED, patient clinically dehydrated, worsened renal function, hypernatremia, chest x-ray suggestive of possible pneumonia, UA-possible UTI. She was admitted for further evaluation and management. Admitting M.D. discussed with patient's daughter who advised minimal labs and try to keep patient as comfortable as possible. DO NOT RESUSCITATE was confirmed.  Hospital Course:   Acute encephalopathy - Secondary to acute illness (metabolic abnormalities and infection) complicating underlying advanced dementia. - CT head showed no acute findings. - Patient empirically  started on IV vancomycin and Fortaz for suspected pneumonia and UTI. - Mental status improved. Patient seen opening eyes and looking around but nonverbal.  - As per family, even though mental status has improved compared to admission, still not what it was a week prior to admission.  Healthcare associated pneumonia versus aspiration pneumonia - Treated with IV vancomycin and Fortaz - Blood culture 1 of 2: Gram-positive rods. Urine culture: Suggestive of contamination. Wound culture pending. - As per family, patient was on pured diet at home. Changed diet to dysphagia 3 diet. - Discussed with patient's son/healthcare power of attorney on 7/3 and he confirmed discharge without antibiotics.  Possible UTI - Treated with [name] - Urine culture: Suggestive of contamination.  ? Bacteremia - Diphtheroids-possibly contaminant - Preliminary culture results also showing gram-negative Cocobacilli in anaerobic bottle only. As discussed, family has opted no antibiotics at discharge.  Dehydration with hypernatremia - Secondary to poor oral intake - Hypotonic IV fluids. - At risk for recurrent dehydration from advanced dementia and poor oral intake - Sodium without significant improvement. & changed IV fluids to D5W. - Hypernatremia resolved. DC IV fluids.  Acute kidney injury - Secondary to dehydration. Resolved.  Hypokalemia - Replaced aggressively.  Chronic anemia - Hemoglobin stable in the last 24 hours.  Sacral decubitus ulcer and heel ulcer - Wound care consultation 6/30 appreciated.  - Continue wound care as per WOC recommendations.  Advanced dementia  Uncontrolled type II DM - SSI. Controlled   PAF - Rate controlled - Resumed Apixaban  Goals of care - Patient is a hospice appropriate candidate with advanced dementia and multiple severe chronic and acute medical problems with overall poor prognosis.  - The undersigned and Palliative MD had family meeting  on 7/2 with son  [name]), daughter [name] and his fiance [name], patient's caregiver and hospice nurse. Discussed at length regarding patient's overall poor prognosis and no likelihood for consistent and meaningful recovery or quality of life. Finally they are agreeable to returning home for comfort care only-no labs, feeding tubes, no return to hospital in the future & no antibiotics.  - On 7/3, discussed with Kristy Como and offered to discontinue all medications not pertaining to comfort (i.e. Namenda, insulins, Eliquis). However he wished to keep these medications at this time.  ? Colovaginal/rectovaginal fistula - Not a candidate for any aggressive intervention.  Severe protein calorie malnutrition - Will be an ongoing issue secondary to advanced dementia and poor oral intake. Not candidate for and would not recommend artificial feeding.  DO NOT RESUSCITATE. MOST form completed.   Consultants:  Palliative care consultation  Procedures:  Foley catheter.   Discharge Exam:  Complaints: Sleeping. Eyes closed. Discussed with niece at bedside.  Filed Vitals:   10/03/14 0518 10/03/14 2128 10/04/14 0601 10/05/14 0605  BP: 104/66 103/57 105/57 114/98  Pulse: 74 86 66 85  Temp: 99 F (37.2 C) 98.4 F (36.9 C) 98.6 F (37 C) 97.8 F (36.6 C)  TempSrc: Axillary Oral Oral Axillary  Resp: $Remo'16 14 16 16  'DFcrU$ Height:      Weight: 53 kg (116 lb 13.5 oz)  60.328 kg (133 lb) 60.328 kg (133 lb)  SpO2: 100% 100% 92% 100%    General exam: Elderly frail female lying comfortably supine in bed Respiratory system: Poor inspiratory effort but seems clear to auscultation except in the bases with reduced breath sounds. No increased work of breathing. Cardiovascular system: S1 & S2 heard, irregularly irregular. No JVD, murmurs, gallops, clicks or pedal edema. Gastrointestinal system: Abdomen is nondistended, soft and nontender. Normal bowel sounds heard. Foley catheter with cloudy urine.  Central nervous  system: sleeping. No focal neurological deficits.  Extremities: No limb movements. Possible contractures or increased tone of all limbs.  Discharge Instructions      Discharge Instructions    Bed rest    Complete by:  As directed      Call MD for:  difficulty breathing, headache or visual disturbances    Complete by:  As directed      Call MD for:  persistant nausea and vomiting    Complete by:  As directed      Call MD for:  redness, tenderness, or signs of infection (pain, swelling, redness, odor or green/yellow discharge around incision site)    Complete by:  As directed      Call MD for:  severe uncontrolled pain    Complete by:  As directed      Call MD for:  temperature >100.4    Complete by:  As directed      Discharge instructions    Complete by:  As directed   1) DIET: Soft diet or comfort feeds. 2) continue oxygen via nasal cannula for comfort. 3) continue indwelling Foley catheter for comfort.     Discharge wound care:    Complete by:  As directed   Dressing procedure/placement/frequency:Cleanse sacral ulcer with NS and pat gently dry. Apply Allevyn silicone border foam dressing. Change every other day and PRN soilage.  Cleanse ulcers to left plantar foot and right heel with soap and water. Apply Allevyn silicone border foam dressing. Change every 3-5 days and PRN soilage.            Medication List  TAKE these medications        acetaminophen 500 MG tablet  Commonly known as:  TYLENOL  Take 1,000 mg by mouth every 6 (six) hours as needed.     apixaban 5 MG Tabs tablet  Commonly known as:  ELIQUIS  Take 1 tablet (5 mg total) by mouth 2 (two) times daily. Call your PCP and get future refills from PCP 10 days before this medication supply ends     feeding supplement (ENSURE ENLIVE) Liqd  Take 237 mLs by mouth 2 (two) times daily between meals.     glucose blood test strip  ONETOUCH ULTRA ULTRA TEST STRIPS Use to test blood sugar before each meal and  at bedtime so 4 times daily ICD 10 E11 59     Insulin Detemir 100 UNIT/ML Pen  Commonly known as:  LEVEMIR FLEXPEN  Inject 8 Units into the skin every evening. 15 units each evening     LORazepam 2 MG/ML concentrated solution  Commonly known as:  ATIVAN  Take 0.5 mLs (1 mg total) by mouth every 6 (six) hours as needed for anxiety.     memantine 10 MG tablet  Commonly known as:  NAMENDA  Take 1 tablet (10 mg total) by mouth 2 (two) times daily.     morphine CONCENTRATE 10 MG/0.5ML Soln concentrated solution  Take 0.5 mLs (10 mg total) by mouth every 3 (three) hours as needed for moderate pain or severe pain.     ONE TOUCH ULTRA 2 W/DEVICE Kit  Use to test blood sugars before each meal and at bedtime so four times daily ICD 10 E11 59     ONETOUCH DELICA LANCETS 30Z Misc  Use to test blood sugar before each meal and at bedtime so four times dialy ICD 10 E11 59     SANTYL ointment  Generic drug:  collagenase  Apply 1 application topically daily.          The results of significant diagnostics from this hospitalization (including imaging, microbiology, ancillary and laboratory) are listed below for reference.    Significant Diagnostic Studies: Ct Head Wo Contrast  09/30/2014   CLINICAL DATA:  Initial evaluation for acute encephalopathy  EXAM: CT HEAD WITHOUT CONTRAST  TECHNIQUE: Contiguous axial images were obtained from the base of the skull through the vertex without intravenous contrast.  COMPARISON:  Prior CT from 08/29/2013  FINDINGS: Atrophy with chronic microvascular ischemic disease present, similar to prior. Remote lacunar infarct present within the bilateral basal ganglia. Remote infarcts also present within the right cerebellar hemisphere.  No acute large vessel territory infarct. No intracranial hemorrhage. No mass lesion, midline shift, or mass effect. No hydrocephalus. No extra-axial fluid collection.  Scalp soft tissues within normal limits. No acute abnormality about  the orbits.  Minimal opacity present within the right ethmoidal air cells. Paranasal sinuses are otherwise clear. No mastoid effusion.  Calvarium intact.  IMPRESSION: 1. No acute intracranial process. 2. Generalized cerebral atrophy with chronic microvascular ischemic disease. 3. Remote lacunar infarcts involving the bilateral basal ganglia, with additional remote right cerebellar infarcts.   Electronically Signed   By: Jeannine Boga M.D.   On: 09/30/2014 05:39   Dg Chest Port 1 View  09/30/2014   CLINICAL DATA:  Fever and dyspnea  EXAM: PORTABLE CHEST - 1 VIEW  COMPARISON:  07/18/2014  FINDINGS: There is left base consolidation consistent with pneumonia. The right lung is clear. There may be a small left effusion.  Heart size is normal  and unchanged. There are intact appearances of the transvenous leads.  IMPRESSION: Left base consolidation consistent with pneumonia.   Electronically Signed   By: Andreas Newport M.D.   On: 09/30/2014 00:25    Microbiology: Recent Results (from the past 240 hour(s))  Wound culture     Status: None   Collection Time: 09/29/14 11:53 PM  Result Value Ref Range Status   Specimen Description WOUND  Final   Special Requests COCCYX  Final   Gram Stain   Final    RARE WBC PRESENT,BOTH PMN AND MONONUCLEAR NO SQUAMOUS EPITHELIAL CELLS SEEN ABUNDANT GRAM NEGATIVE RODS ABUNDANT GRAM POSITIVE COCCI IN PAIRS IN CLUSTERS MODERATE GRAM POSITIVE RODS    Culture   Final    MULTIPLE ORGANISMS PRESENT, NONE PREDOMINANT Note: NO STAPHYLOCOCCUS AUREUS ISOLATED NO GROUP A STREP (S.PYOGENES) ISOLATED Performed at [facility]    Report Status 10/02/2014 FINAL  Final  Urine culture     Status: None   Collection Time: 09/30/14 12:05 AM  Result Value Ref Range Status   Specimen Description URINE, CATHETERIZED  Final   Special Requests NONE  Final   Culture   Final    MULTIPLE SPECIES PRESENT, SUGGEST RECOLLECTION IF CLINICALLY INDICATED   Report Status  10/01/2014 FINAL  Final  Culture, blood (routine x 2)     Status: None (Preliminary result)   Collection Time: 09/30/14 12:15 AM  Result Value Ref Range Status   Specimen Description BLOOD RIGHT ANTECUBITAL  Final   Special Requests   Final    BOTTLES DRAWN AEROBIC AND ANAEROBIC 10CC BLUE 5CC RED   Culture  Setup Time   Final    GRAM POSITIVE RODS IN BOTH AEROBIC AND ANAEROBIC BOTTLES CRITICAL RESULT CALLED TO, READ BACK BY AND VERIFIED WITH: JBOZEMAN,RN AT 0623 10/01/14 BY LBENFIELD    Culture   Final    DIPHTHEROIDS(CORYNEBACTERIUM SPECIES) AEROBIC BOTTLE ONLY GRAM NEGATIVE COCCOBACILLI ANAEROBIC BOTTLE ONLY CRITICAL RESULT CALLED TO, READ BACK BY AND VERIFIED WITH: B HALL,RN AT 1145 10/04/14 BY L BENFIELD    Report Status PENDING  Incomplete  Blood culture (routine x 2)     Status: None (Preliminary result)   Collection Time: 09/30/14 12:27 AM  Result Value Ref Range Status   Specimen Description BLOOD RIGHT HAND  Final   Special Requests BOTTLES DRAWN AEROBIC ONLY 10CC  Final   Culture NO GROWTH 4 DAYS  Final   Report Status PENDING  Incomplete     Labs: Basic Metabolic Panel:  Recent Labs Lab 09/30/14 1045 10/01/14 1818 10/02/14 0830 10/03/14 0338 10/04/14 0349  NA 148* 148* 147* 144 136  K 2.9* 3.0* 3.6 2.8* 3.6  CL 117* 117* 115* 113* 105  CO2 $Re'23 23 25 25 24  'QaW$ GLUCOSE 280* 215* 160* 197* 185*  BUN 39* 30* 22* 18 12  CREATININE 0.97 0.87 0.87 0.64 0.71  CALCIUM 7.7* 8.2* 8.1* 7.9* 7.7*  MG  --   --   --  1.6* 2.0   Liver Function Tests:  Recent Labs Lab 09/30/14 0012 09/30/14 1045  AST 26 21  ALT 19 19  ALKPHOS 103 91  BILITOT 1.2 0.9  PROT 5.6* 5.8*  ALBUMIN 1.8* 1.8*   No results for input(s): LIPASE, AMYLASE in the last 168 hours. No results for input(s): AMMONIA in the last 168 hours. CBC:  Recent Labs Lab 09/30/14 0012 09/30/14 1045 10/01/14 1818 10/02/14 0830 10/03/14 0338 10/04/14 0349  WBC 11.2* 10.5 16.5* 19.3* 16.4* 15.1*  NEUTROABS  8.4* 8.3*  --   --   --   --   HGB 7.9* 8.7* 8.6* 7.8* 7.4* 7.8*  HCT 25.3* 28.7* 27.4* 25.5* 24.3* 25.5*  MCV 84.1 86.2 84.0 83.9 83.2 83.1  PLT 199 203 244 243 246 285   Cardiac Enzymes: No results for input(s): CKTOTAL, CKMB, CKMBINDEX, TROPONINI in the last 168 hours. BNP: BNP (last 3 results) No results for input(s): BNP in the last 8760 hours.  ProBNP (last 3 results) No results for input(s): PROBNP in the last 8760 hours.  CBG:  Recent Labs Lab 10/04/14 1148 10/04/14 1654 10/04/14 2127 10/05/14 0746 10/05/14 1204  GLUCAP 171* 169* 159* 160* 122*   Discussed at length with patient's niece at bedside, and with patient's son Kristy Como and his fiance Ms. [name] via phone.   Signed:  Vernell Leep, MD, FACP, FHM. [facility] Pager [phone]  If 7PM-7AM, please contact night-coverage [email or web address] Password [facility] 10/05/2014, 12:35 PM

## 2014-10-06 LAB — CULTURE, BLOOD (ROUTINE X 2)

## 2014-10-07 ENCOUNTER — Telehealth: Payer: Self-pay

## 2014-10-07 NOTE — Telephone Encounter (Signed)
Pt is on TCM list. Pt dced to Hospice care.

## 2014-11-03 DEATH — deceased

## 2014-11-24 IMAGING — CT CT HEAD W/O CM
4 of 8 series · 15 of 47 positions shown, 17 images · non-contrast
Comparison: 08/06/2013, 04/20/2013, 08/19/2010

CLINICAL DATA: Status post fall, patient on Coumadin, swelling
around the nose

EXAM:
CT HEAD WITHOUT CONTRAST
CT MAXILLOFACIAL WITHOUT CONTRAST
CT CERVICAL SPINE WITHOUT CONTRAST
TECHNIQUE: Multidetector CT imaging of the head, cervical spine, and
maxillofacial structures were performed using the standard protocol
without intravenous contrast. Multiplanar CT image reconstructions
of the cervical spine and maxillofacial structures were also
generated.

[Series 5: facial/ orbits 2.0 h30s · axial · 0.31mm/px · z∈[-176,-154]mm · 2 of 80 slices shown]
[im 12/80  brain]
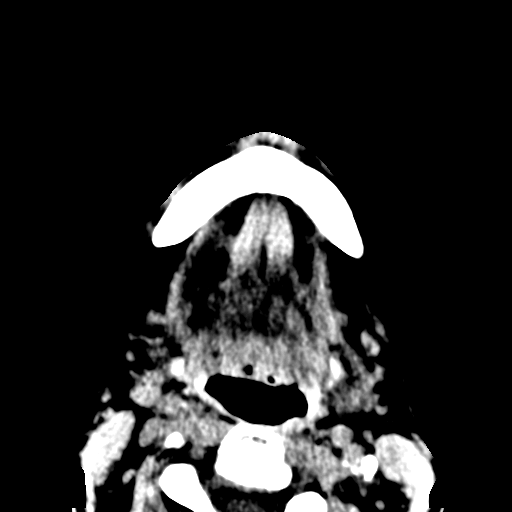
[im 23/80  brain]
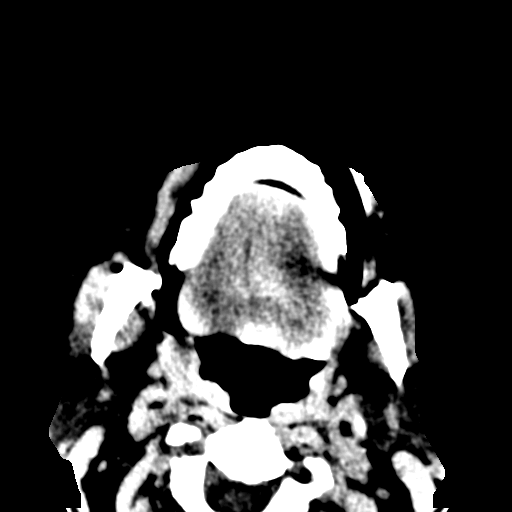

[Series 9: coronal soft tissue · coronal · 0.53mm/px · 3 of 71 slices shown]
[im 21/71  brain]
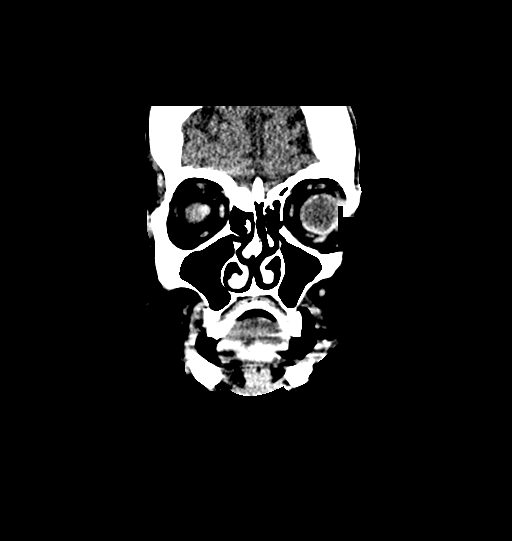
[im 31/71  brain]
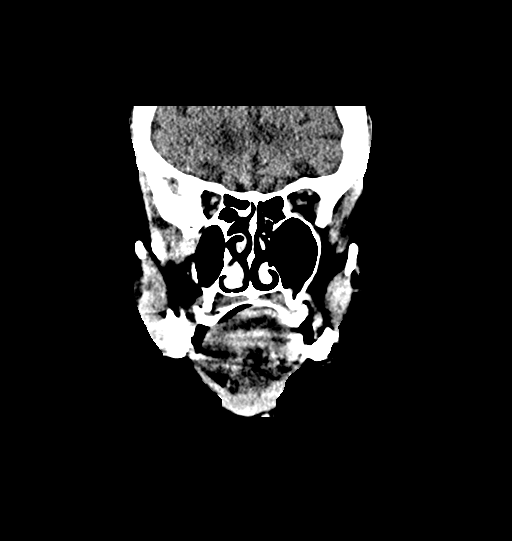
[im 41/71  brain]
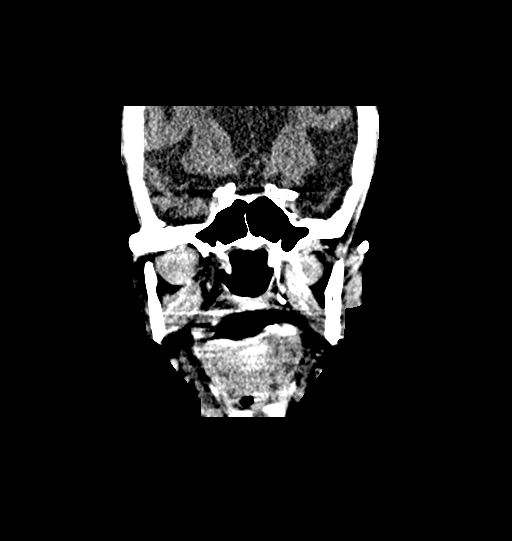

[Series 10: sagittal soft tissue · sagittal · 0.65mm/px · 2 of 76 slices shown]
[im 26/76  brain]
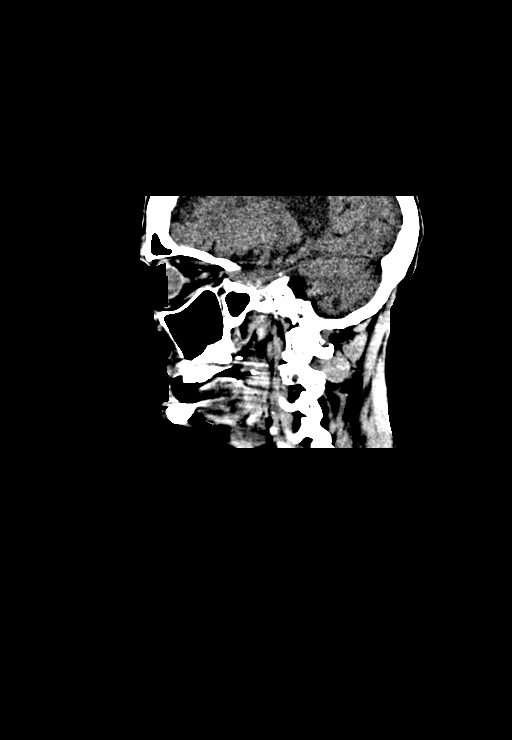
[im 51/76  brain]
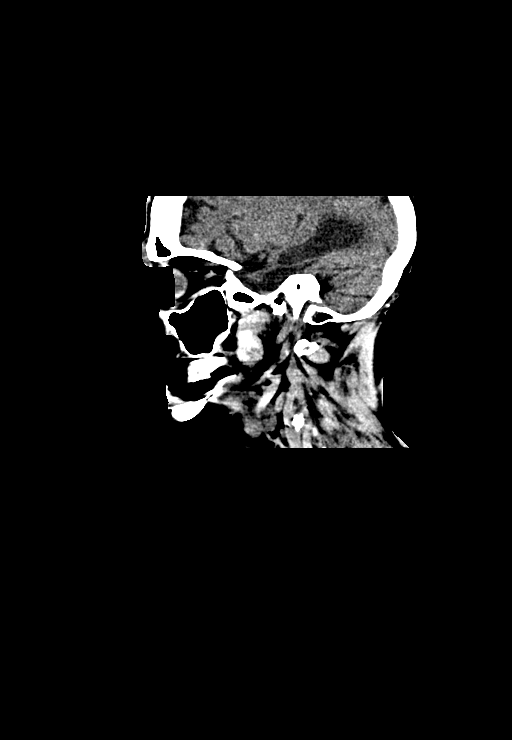

[Series 19: orthogonals · axial · 0.23mm/px · z∈[-296,-151]mm · 8 of 101 slices shown, 10 images]
[im 12/101  brain]
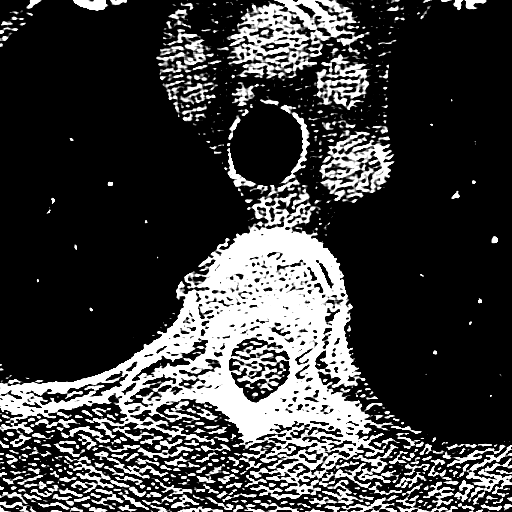
[im 12/101  bone]
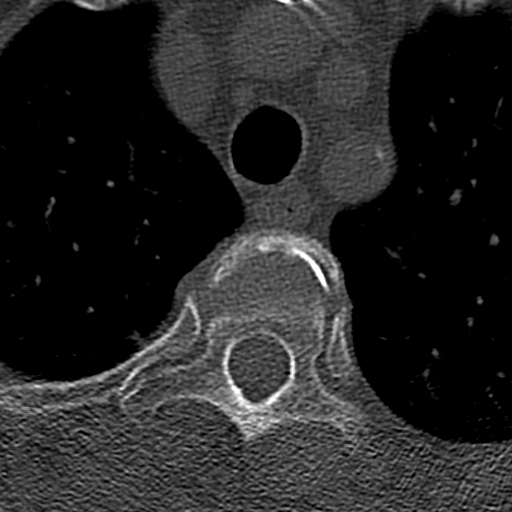
[im 23/101  brain]
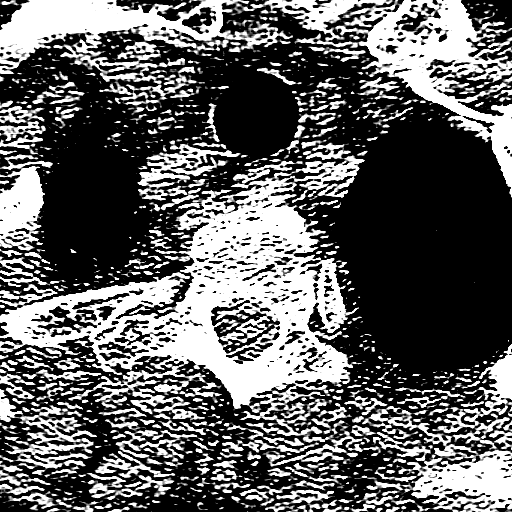
[im 34/101  brain]
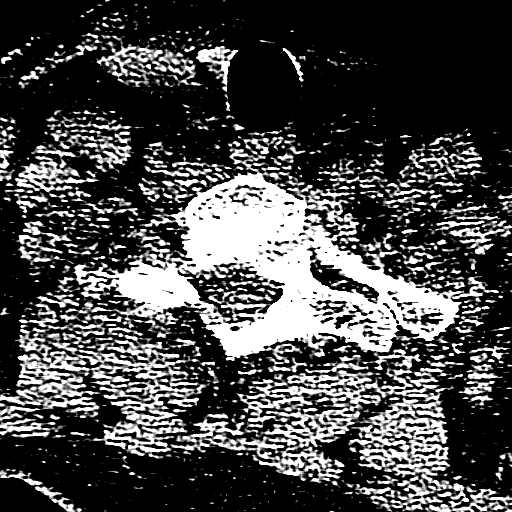
[im 45/101  brain]
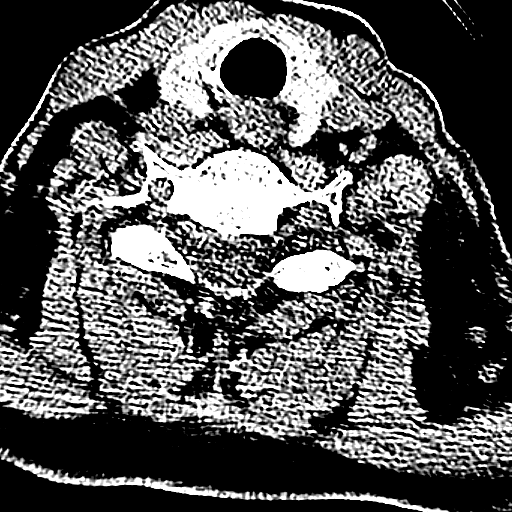
[im 56/101  brain]
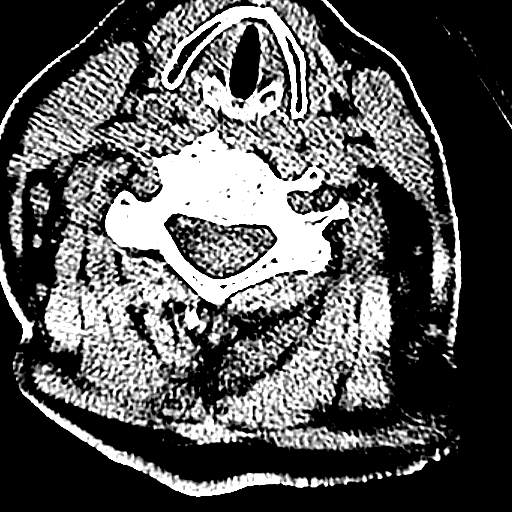
[im 56/101  bone]
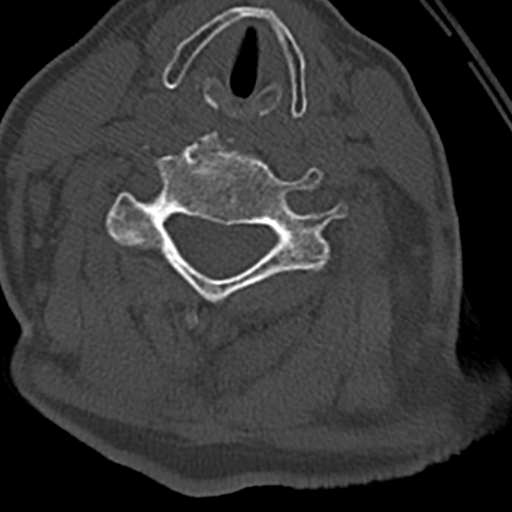
[im 67/101  brain]
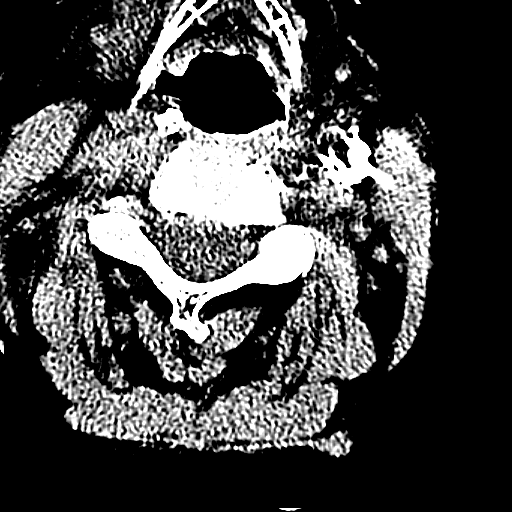
[im 78/101  brain]
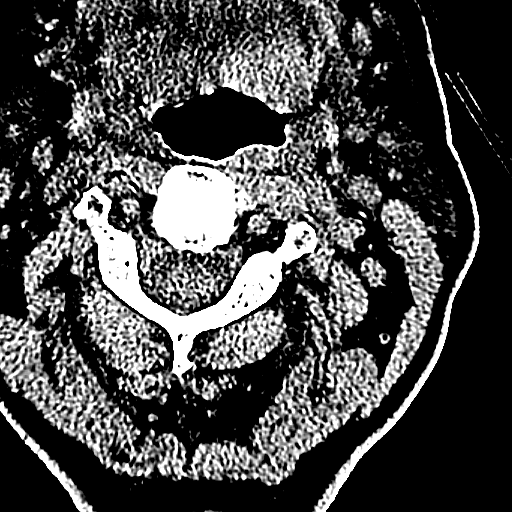
[im 89/101  brain]
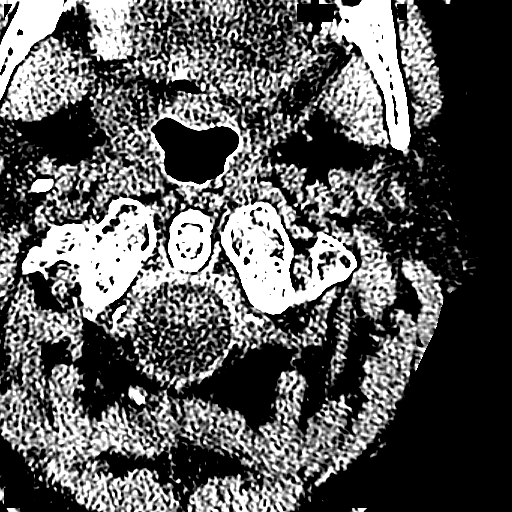

[15 of 47 positions shown; findings below may reference images not displayed]

FINDINGS: CT HEAD FINDINGS

There is no evidence of mass effect, midline shift, or extra-axial
fluid collections. There is no evidence of a space-occupying lesion
or intracranial hemorrhage. There is no evidence of a cortical-based
area of acute infarction. There is an old right basal ganglia
lacunar infarct. There is an old left thalamic lacunar infarct.
There is an old right cerebellar infarct. There is generalized
cerebral atrophy. There is periventricular white matter low
attenuation likely secondary to microangiopathy.

The ventricles and sulci are appropriate for the patient's age.
There is generalized ventriculomegaly which is commensurate with the
degree of cortical atrophy. The basal cisterns are patent.

Visualized portions of the orbits are unremarkable. The visualized
portions of the paranasal sinuses and mastoid air cells are
unremarkable. Cerebrovascular atherosclerotic calcifications are
noted.

The osseous structures are unremarkable.

CT MAXILLOFACIAL FINDINGS

The globes are intact. The orbital walls are intact. The orbital
floors are intact. The maxilla is intact. The mandible is intact.
The zygomatic arches are intact. There are comminuted bilateral
nasal bone fractures with overlying soft tissue swelling. There is
buckling of the nasal septum likely reflecting a nondisplaced
fracture with leftward deviation. The temporomandibular joints are
normal.

Small mucous retention cyst in the left maxillary sinus. No
paranasal sinus air-fluid level. The visualized portions of the
mastoid sinuses are well aerated.

CT CERVICAL SPINE FINDINGS

The alignment is anatomic. The vertebral body heights are
maintained. There is no acute fracture. There is no static
listhesis. The prevertebral soft tissues are normal. The intraspinal
soft tissues are not fully imaged on this examination due to poor
soft tissue contrast, but there is no gross soft tissue abnormality.

There is mild degenerative disc disease at C5-6 with disc height
loss. There is a broad-based disc bulge at C4-5. There is a
broad-based disc osteophyte complex at C5-6 with bilateral
uncovertebral degenerative changes resulting in foraminal
encroachment.

The visualized portions of the lung apices demonstrate no focal
abnormality.Bilateral carotid artery atherosclerosis.
IMPRESSION: 1. No acute intracranial pathology.
2. Comminuted bilateral nasal bone fractures with overlying soft
tissue swelling. Buckling of the nasal septum likely reflecting a
nondisplaced fracture with leftward deviation.
3. No acute osseous injury of the cervical spine.
4. Cervical spine spondylosis at C4-5 and C5-6 as described above.
